# Patient Record
Sex: Female | Born: 1975 | Race: White | Hispanic: No | Smoking: Former smoker
Health system: Southern US, Community
[De-identification: ages and names within clinical notes are randomized; demographics above are authoritative.]

## PROBLEM LIST (undated history)

## (undated) DIAGNOSIS — B009 Herpesviral infection, unspecified: Secondary | ICD-10-CM

## (undated) DIAGNOSIS — F419 Anxiety disorder, unspecified: Secondary | ICD-10-CM

## (undated) DIAGNOSIS — IMO0001 Reserved for inherently not codable concepts without codable children: Secondary | ICD-10-CM

## (undated) DIAGNOSIS — IMO0002 Reserved for concepts with insufficient information to code with codable children: Secondary | ICD-10-CM

## (undated) DIAGNOSIS — R112 Nausea with vomiting, unspecified: Secondary | ICD-10-CM

## (undated) DIAGNOSIS — F32A Depression, unspecified: Secondary | ICD-10-CM

## (undated) DIAGNOSIS — K219 Gastro-esophageal reflux disease without esophagitis: Secondary | ICD-10-CM

## (undated) DIAGNOSIS — C539 Malignant neoplasm of cervix uteri, unspecified: Secondary | ICD-10-CM

## (undated) DIAGNOSIS — Z9889 Other specified postprocedural states: Secondary | ICD-10-CM

## (undated) DIAGNOSIS — F329 Major depressive disorder, single episode, unspecified: Secondary | ICD-10-CM

## (undated) HISTORY — DX: Herpesviral infection, unspecified: B00.9

## (undated) HISTORY — PX: WISDOM TOOTH EXTRACTION: SHX21

## (undated) HISTORY — PX: BACK SURGERY: SHX140

## (undated) HISTORY — PX: COLPOSCOPY: SHX161

## (undated) HISTORY — DX: Major depressive disorder, single episode, unspecified: F32.9

## (undated) HISTORY — DX: Reserved for concepts with insufficient information to code with codable children: IMO0002

## (undated) HISTORY — DX: Reserved for inherently not codable concepts without codable children: IMO0001

## (undated) HISTORY — DX: Anxiety disorder, unspecified: F41.9

## (undated) HISTORY — DX: Depression, unspecified: F32.A

---

## 1998-05-19 ENCOUNTER — Other Ambulatory Visit: Admission: RE | Admit: 1998-05-19 | Discharge: 1998-05-19 | Payer: Self-pay | Admitting: *Deleted

## 1998-06-12 ENCOUNTER — Other Ambulatory Visit: Admission: RE | Admit: 1998-06-12 | Discharge: 1998-06-12 | Payer: Self-pay | Admitting: *Deleted

## 1998-09-18 ENCOUNTER — Other Ambulatory Visit: Admission: RE | Admit: 1998-09-18 | Discharge: 1998-09-18 | Payer: Self-pay | Admitting: *Deleted

## 1999-07-11 ENCOUNTER — Ambulatory Visit (HOSPITAL_COMMUNITY): Admission: RE | Admit: 1999-07-11 | Discharge: 1999-07-11 | Payer: Self-pay | Admitting: Gastroenterology

## 1999-07-11 HISTORY — PX: COLONOSCOPY: SHX5424

## 1999-07-13 ENCOUNTER — Other Ambulatory Visit: Admission: RE | Admit: 1999-07-13 | Discharge: 1999-07-13 | Payer: Self-pay | Admitting: *Deleted

## 2000-01-14 ENCOUNTER — Other Ambulatory Visit: Admission: RE | Admit: 2000-01-14 | Discharge: 2000-01-14 | Payer: Self-pay | Admitting: *Deleted

## 2000-06-13 ENCOUNTER — Other Ambulatory Visit: Admission: RE | Admit: 2000-06-13 | Discharge: 2000-06-13 | Payer: Self-pay | Admitting: *Deleted

## 2000-07-28 ENCOUNTER — Ambulatory Visit (HOSPITAL_COMMUNITY): Admission: RE | Admit: 2000-07-28 | Discharge: 2000-07-28 | Payer: Self-pay | Admitting: Obstetrics and Gynecology

## 2000-07-28 HISTORY — PX: DIAGNOSTIC LAPAROSCOPY: SUR761

## 2001-02-16 ENCOUNTER — Emergency Department (HOSPITAL_COMMUNITY): Admission: EM | Admit: 2001-02-16 | Discharge: 2001-02-16 | Payer: Self-pay | Admitting: Emergency Medicine

## 2001-03-26 ENCOUNTER — Encounter: Payer: Self-pay | Admitting: Obstetrics and Gynecology

## 2001-03-26 ENCOUNTER — Ambulatory Visit (HOSPITAL_COMMUNITY): Admission: RE | Admit: 2001-03-26 | Discharge: 2001-03-26 | Payer: Self-pay | Admitting: Obstetrics and Gynecology

## 2001-04-10 ENCOUNTER — Encounter: Payer: Self-pay | Admitting: Obstetrics and Gynecology

## 2001-04-10 ENCOUNTER — Ambulatory Visit (HOSPITAL_COMMUNITY): Admission: RE | Admit: 2001-04-10 | Discharge: 2001-04-10 | Payer: Self-pay | Admitting: Obstetrics and Gynecology

## 2001-08-08 ENCOUNTER — Inpatient Hospital Stay (HOSPITAL_COMMUNITY): Admission: AD | Admit: 2001-08-08 | Discharge: 2001-08-10 | Payer: Self-pay | Admitting: *Deleted

## 2001-09-10 ENCOUNTER — Other Ambulatory Visit: Admission: RE | Admit: 2001-09-10 | Discharge: 2001-09-10 | Payer: Self-pay | Admitting: Obstetrics and Gynecology

## 2003-02-10 ENCOUNTER — Other Ambulatory Visit: Admission: RE | Admit: 2003-02-10 | Discharge: 2003-02-10 | Payer: Self-pay | Admitting: Obstetrics and Gynecology

## 2004-10-31 ENCOUNTER — Other Ambulatory Visit: Admission: RE | Admit: 2004-10-31 | Discharge: 2004-10-31 | Payer: Self-pay | Admitting: Obstetrics and Gynecology

## 2005-06-23 ENCOUNTER — Inpatient Hospital Stay (HOSPITAL_COMMUNITY): Admission: AD | Admit: 2005-06-23 | Discharge: 2005-06-25 | Payer: Self-pay | Admitting: Obstetrics and Gynecology

## 2009-01-27 ENCOUNTER — Emergency Department (HOSPITAL_COMMUNITY): Admission: EM | Admit: 2009-01-27 | Discharge: 2009-01-28 | Payer: Self-pay | Admitting: Emergency Medicine

## 2009-01-28 ENCOUNTER — Inpatient Hospital Stay (HOSPITAL_COMMUNITY): Admission: AD | Admit: 2009-01-28 | Discharge: 2009-02-02 | Payer: Self-pay | Admitting: Psychiatry

## 2009-01-28 ENCOUNTER — Ambulatory Visit: Payer: Self-pay | Admitting: Psychiatry

## 2010-04-19 ENCOUNTER — Ambulatory Visit
Admission: RE | Admit: 2010-04-19 | Discharge: 2010-04-19 | Payer: Self-pay | Source: Home / Self Care | Attending: Obstetrics and Gynecology | Admitting: Obstetrics and Gynecology

## 2010-04-19 ENCOUNTER — Encounter: Payer: Self-pay | Admitting: Physician Assistant

## 2010-04-19 LAB — CONVERTED CEMR LAB
HCV Ab: NEGATIVE
HIV: NONREACTIVE
Hepatitis B Surface Ag: NEGATIVE

## 2010-04-20 ENCOUNTER — Encounter: Payer: Self-pay | Admitting: Physician Assistant

## 2010-06-25 ENCOUNTER — Ambulatory Visit: Payer: Self-pay | Admitting: Obstetrics and Gynecology

## 2010-07-12 ENCOUNTER — Other Ambulatory Visit: Payer: Self-pay | Admitting: Physician Assistant

## 2010-07-12 ENCOUNTER — Ambulatory Visit: Payer: Self-pay | Admitting: Physician Assistant

## 2010-07-12 DIAGNOSIS — Z30430 Encounter for insertion of intrauterine contraceptive device: Secondary | ICD-10-CM

## 2010-07-12 LAB — POCT PREGNANCY, URINE: Preg Test, Ur: NEGATIVE

## 2010-07-13 ENCOUNTER — Other Ambulatory Visit: Payer: Self-pay | Admitting: Physician Assistant

## 2010-07-13 LAB — POCT PREGNANCY, URINE: Preg Test, Ur: NEGATIVE

## 2010-07-13 NOTE — Progress Notes (Signed)
NAMEGISELLA, Karen Daniels               ACCOUNT NO.:  [id number]  MEDICAL RECORD NO.:  [id number]           PATIENT TYPE:  A  LOCATION:  [facility]                   FACILITY:  [facility]  PHYSICIAN:  Maylon Cos, CNM    DATE OF BIRTH:  1975-05-01  DATE OF SERVICE:  07/12/2010                                 CLINIC NOTE  REASON FOR TODAY'S VISIT:  Mirena IUD insertion.  HISTORY OF PRESENT ILLNESS:  The patient is a 35 year old, no longer desiring fertility.  I saw her last on in January 2012.  She is gravida 4, para 2-0-1-2, desiring no more children.  She presents today for the insertion of her Mirena IUD, consent has been signed, and literature has been reviewed.  The patient was placed in lithotomy position and Graves speculum was used to visualize the para cervix.  Cervix was cleansed in normal fashion with Betadine solution and tenaculum was placed at approximately 2 o'clock on the cervix.  Uterus sounded to 8 cm, Mirena IUD, tolerated to the same depth, inserted without difficulty, IUD strings were trimmed to approximately 1 cm below the external os. Tenaculum was removed and hemostasis was maintained with pressure of Fox swab.  Speculum was removed.  The patient tolerated this procedure very well.  Lot number on ID used is [id number], expiration 11/14.  ASSESSMENT:  Mirena intrauterine device insertion.  PLAN: 1. The patient is to return in 5-6 weeks for further recheck of IUD     strings or p.r.n. problems. 2. The patient is given 800 mg ibuprofen prior leaving clinic today     for cramping as instructed that she may use 600-800 mg ibuprofen as     needed for abdominal cramping.          ______________________________ Maylon Cos, CNM    SS/MEDQ  D:  07/12/2010  T:  07/13/2010  Job:  [id number]

## 2010-07-19 LAB — CK TOTAL AND CKMB (NOT AT ARMC)
Relative Index: 0.8 (ref 0.0–2.5)
Total CK: 180 U/L — ABNORMAL HIGH (ref 7–177)

## 2010-07-19 LAB — URINALYSIS, ROUTINE W REFLEX MICROSCOPIC
Bilirubin Urine: NEGATIVE
Nitrite: NEGATIVE
Specific Gravity, Urine: 1.01 (ref 1.005–1.030)
pH: 7 (ref 5.0–8.0)

## 2010-07-19 LAB — ACETAMINOPHEN LEVEL: Acetaminophen (Tylenol), Serum: <10 ug/mL — ABNORMAL LOW (ref 10–30)

## 2010-07-19 LAB — RAPID URINE DRUG SCREEN, HOSP PERFORMED
Amphetamines: NOT DETECTED
Barbiturates: NOT DETECTED
Benzodiazepines: POSITIVE — AB
Cocaine: NOT DETECTED
Opiates: NOT DETECTED
Tetrahydrocannabinol: NOT DETECTED

## 2010-07-19 LAB — BASIC METABOLIC PANEL
BUN: 7 mg/dL (ref 6–23)
Calcium: 8.9 mg/dL (ref 8.4–10.5)
Creatinine, Ser: 0.71 mg/dL (ref 0.4–1.2)
GFR calc non Af Amer: >60 mL/min (ref >60)

## 2010-07-19 LAB — SALICYLATE LEVEL: Salicylate Lvl: <4 mg/dL (ref 2.8–20.0)

## 2010-07-19 LAB — DIFFERENTIAL
Eosinophils Absolute: 0.3 10*3/uL (ref 0.0–0.7)
Lymphocytes Relative: 40 % (ref 12–46)
Lymphs Abs: 3.4 10*3/uL (ref 0.7–4.0)
Neutrophils Relative %: 52 % (ref 43–77)

## 2010-07-19 LAB — CBC
Platelets: 328 10*3/uL (ref 150–400)
WBC: 8.7 10*3/uL (ref 4.0–10.5)

## 2010-07-19 LAB — PREGNANCY, URINE: Preg Test, Ur: NEGATIVE

## 2010-07-19 LAB — TROPONIN I: Troponin I: 0.01 ng/mL (ref 0.00–0.06)

## 2010-07-19 LAB — ETHANOL: Alcohol, Ethyl (B): 39 mg/dL — ABNORMAL HIGH (ref 0–10)

## 2010-08-23 ENCOUNTER — Ambulatory Visit: Payer: Self-pay | Admitting: Physician Assistant

## 2010-08-31 NOTE — Procedures (Signed)
[place]  Patient:    Karen Daniels, Karen Daniels                   MRN: [id number] Proc. Date: 07/11/99 Attending:  Anselmo Rod, M.D. CC:         Meliton Rattan, M.D.                           Procedure Report  DATE OF BIRTH:  [birth year]  REFERRING PHYSICIAN:  Meliton Rattan, M.D.  PROCEDURE PERFORMED:  Colonoscopy.  ENDOSCOPIST:  Anselmo Rod, M.D.  INSTRUMENT USED:  Olympus video colonoscope.  INDICATION FOR PROCEDURE:  Abdominal pain and right lower quadrant pain and diarrhea and guaiac positive stools in a 35 year old white female rule out colitis, Crohns, etc.  PREPROCEDURE PREPARATION: Informed consent was procured from the patient.  The patient was fasted for eight hours prior to the procedure and prepped with a bottle of magnesium citrate and a gallon of NuLytely the night prior to the procedure.  PREPROCEDURE PHYSICAL:  The patient had stable vital signs.  NECK:  Supple.  CHEST: Clear to auscultation.  S1 and S2 regular.  ABDOMEN:  Soft with normoactive bowel sounds.  DESCRIPTION OF PROCEDURE:  The patient was placed in the left lateral decubitus  position and sedated with 100 mg of Demerol and 10 mg of Versed intravenously.  Once the patient was adequately sedated and maintained on low flow oxygen and continuous cardiac monitoring, the Olympus video colonoscope was advanced from he rectum to the cecum and terminal ileum without difficulty.  Colonic mucosa appeared healthy without evidence of erosions, ulcerations, masses or polyps. There were  small internal hemorrhoids.  The patient tolerated the procedure well without complications.  IMPRESSION:  Normal colonoscopy and terminal ileum except for small nonbleeding  internal hemorrhoids.  RECOMMENDATIONS:  The patients CT scan will be reviewed again and a gynecological evaluation suggested.  The patient may need a small bowel study to further complete a GI  evaluation.  This will be discussed with her on the next office visit. DD:  07/11/99 TD:  07/12/99 Job: [id number]

## 2010-08-31 NOTE — Op Note (Signed)
[facility]  Patient:    Karen Daniels, Karen Daniels                   MRN: [id number] Proc. Date: 07/28/00 Attending:  Aram Beecham P. Ashley Royalty, M.D. CC:         -   Operative Report  PREOPERATIVE DIAGNOSIS:  Pelvic pain.  POSTOPERATIVE DIAGNOSIS:  Pelvic pain.  Periappendiceal adhesions.  PROCEDURE:  Diagnostic laparoscopy, lysis of adhesions.  SURGEON:  Cynthia P. Ashley Royalty, M.D.  ANESTHESIA:  General endotracheal.  ESTIMATED BLOOD LOSS:  50 cc.  COMPLICATIONS:  None.  DESCRIPTION OF PROCEDURE:  The patient was taken to the operating room and after the induction of adequate general endotracheal anesthesia was placed in the dorsal lithotomy position then prepped and draped in the usual fashion. The cervix was grasped about the anterior lip with a single-tooth tenaculum. A Hulka uterine manipulator was placed.  The bladder was drained with a red rubber catheter.  Attention was next turned to the abdomen.  An infraumbilical incision was made and the Veress needle was inserted into the peritoneal space.  Proper placement was tested by noting a negative aspirate and free flow of saline through the Veress needle; again with negative aspirate.  On my noting a response of a drop of saline placed at the hub of the Veress needle to ______ pressure after the abdominal wall was elevated.  Pneumoperitoneum was created with 2.5 L of CO2 using the automatic insufflator.  A disposable 10-11 trocar was then inserted into the peritoneal space; there was proper placement noted with the laparoscope.  The pelvis was inspected.  The uterus was freely mobile.  There was a small, approximately 0.5 cm serosal myoma on the left fundus posteriorly.  The tubes and ovaries appeared completely normal.  There was no evidence of endometriosis.  The anterior and posterior cul-de-sacs were also completely normal.  The fimbria on both ends were luxurious.  The pelvis was normal.  Inspection  of the appendix revealed the appendix to have some periappendiceal adhesions between it and the pelvic sidewalls.  The appendix itself was not inflamed or irritated.  The adhesions were filmy; they were taken down with unipolar scissors.  Bleeding was controlled with the bipolar cautery without difficulty.  The upper abdomen was also inspected and felt to be normal.  There were no hepatic adhesions.  The intestines appeared normal and were freely mobile.  No other abnormalities could be seen.  The instruments were then removed from the abdomen. Pneumoperitoneum was allowed to escape.  The sleeves were removed and the incisions were closed subcuticularly with 3-0 Dexon.  Instruments were removed from the vagina.  The procedure was terminated.  The patient tolerated the procedure well, and went in satisfactory condition to Post-anesthesia Recovery.DD:  07/28/00 TD:  07/28/00 Job: [id number]

## 2010-08-31 NOTE — Discharge Summary (Signed)
Karen Daniels, HEATON               ACCOUNT NO.:  [id number]   MEDICAL RECORD NO.:  [id number]          PATIENT TYPE:  INP   LOCATION:  9146                          [facility]ACILITY[facility]   PHYSICIAN:  Gerrit Friends. Aldona Bar, M.D.   DATE OF BIRTH:  July 02, 1975   DATE OF ADMISSION:  06/23/2005  DATE OF DISCHARGE:  06/25/2005                                 DISCHARGE SUMMARY   DISCHARGE DIAGNOSES:  1.  Term pregnancy, delivered, 6 pounds 5 ounces female infant, Apgars 9 and      9.  2.  Blood type O+.  3.  Positive Group B strep, antenatal.   PROCEDURES:  1.  Normal spontaneous delivery.  2.  Repair of second-degree tear.  3.  Intrapartum antibiotics.   SUMMARY:  This 35 year old gravida 3, para 1 was admitted at term in labor.  She had a positive group B strep culture at 36 weeks' gestation.  At the  time of admission, she was 4-cm dilated, 80% effaced, vertex, -1 station.  Fetal heart was reassuring.  Membranes were intact.  She was begun on  aqueous penicillin G for Group B strep prophylaxis.  She had good  progression of her labor with a subsequent normal spontaneous delivery of  viable 6 pounds 5 ounces female infant with Apgar of 9 and 9, over a second-  degree tear which was repaired without difficulty.  Her postpartum course  was benign.  Her discharge hemoglobin 11.9, white count 17,400, platelet  count 251,000.  On the morning of June 25, 2005, her vital signs were  stable.  She was bottle-feeding without difficulty.  She was comfortable,  ambulating well, tolerating a regular diet well, and was desirous of  discharge.  Accordingly, she was given all appropriate instructions and  understood all instructions well.   DISCHARGE MEDICATIONS:  1.  Vitamins one a day until gone.  2.  Motrin 600 mg every 6 hours as needed for cramping or pain.   She was bottle-feeding and was instructed appropriately.  She will return to  the office for followup in approximately four weeks' time or as  needed.   CONDITION ON DISCHARGE:  Improved.      Gerrit Friends. Aldona Bar, M.D.  Electronically Signed     RMW/MEDQ  D:  06/25/2005  T:  06/25/2005  Job:  [id number]

## 2010-09-05 ENCOUNTER — Ambulatory Visit: Payer: Self-pay | Admitting: Obstetrics & Gynecology

## 2010-09-05 ENCOUNTER — Ambulatory Visit (INDEPENDENT_AMBULATORY_CARE_PROVIDER_SITE_OTHER): Payer: Self-pay | Admitting: Family Medicine

## 2010-09-05 DIAGNOSIS — Z30431 Encounter for routine checking of intrauterine contraceptive device: Secondary | ICD-10-CM

## 2010-09-06 NOTE — Group Therapy Note (Signed)
NAMEJAZZMON, PRINDLE               ACCOUNT NO.:  [id number]  MEDICAL RECORD NO.:  [id number]           PATIENT TYPE:  A  LOCATION:  [facility]                   FACILITY:  [facility]  PHYSICIAN:  Sid Falcon, CNM  DATE OF BIRTH:  1975/08/27  DATE OF SERVICE:  09/05/2010                                 CLINIC NOTE  REASON FOR VISIT:  IUD string check.  The patient previously seen on March 29, had a Mirena IUD inserted without difficulty.  She is here today stating no problems with the apparatus and desires to continue.  No problems or concerns.  PHYSICAL EXAMINATION:  PELVIS:  Cervix visualized without difficulty. IUD string is present, approximately 2 cm.  ASSESSMENT:  Intrauterine device surveillance  PLAN:  Continue with IUD, may check once a month.  If desires, to check for expulsion.  Follow up in January for well-woman exam     Sid Falcon, [place]    WM/MEDQ  D:  09/05/2010  T:  09/06/2010  Job:  [id number]

## 2010-10-01 ENCOUNTER — Emergency Department (HOSPITAL_BASED_OUTPATIENT_CLINIC_OR_DEPARTMENT_OTHER)
Admission: EM | Admit: 2010-10-01 | Discharge: 2010-10-01 | Disposition: A | Discharge status: Home / Self Care | Payer: Self-pay | Attending: Emergency Medicine | Admitting: Emergency Medicine

## 2010-10-01 DIAGNOSIS — A499 Bacterial infection, unspecified: Secondary | ICD-10-CM | POA: Insufficient documentation

## 2010-10-01 DIAGNOSIS — A6000 Herpesviral infection of urogenital system, unspecified: Secondary | ICD-10-CM | POA: Insufficient documentation

## 2010-10-01 DIAGNOSIS — N390 Urinary tract infection, site not specified: Secondary | ICD-10-CM | POA: Insufficient documentation

## 2010-10-01 DIAGNOSIS — B9689 Other specified bacterial agents as the cause of diseases classified elsewhere: Secondary | ICD-10-CM | POA: Insufficient documentation

## 2010-10-01 DIAGNOSIS — N760 Acute vaginitis: Secondary | ICD-10-CM | POA: Insufficient documentation

## 2010-10-01 LAB — URINALYSIS, ROUTINE W REFLEX MICROSCOPIC
Bilirubin Urine: NEGATIVE
Specific Gravity, Urine: 1.006 (ref 1.005–1.030)
Urobilinogen, UA: 0.2 mg/dL (ref 0.0–1.0)

## 2010-10-01 LAB — WET PREP, GENITAL
Trich, Wet Prep: NONE SEEN
Yeast Wet Prep HPF POC: NONE SEEN

## 2010-10-01 LAB — URINE MICROSCOPIC-ADD ON

## 2010-10-02 LAB — GC/CHLAMYDIA PROBE AMP, GENITAL: GC Probe Amp, Genital: NEGATIVE

## 2010-10-08 LAB — HERPES SIMPLEX VIRUS CULTURE

## 2011-08-07 ENCOUNTER — Encounter: Payer: Self-pay | Admitting: Advanced Practice Midwife

## 2011-08-07 ENCOUNTER — Ambulatory Visit (INDEPENDENT_AMBULATORY_CARE_PROVIDER_SITE_OTHER): Payer: Self-pay | Admitting: Advanced Practice Midwife

## 2011-08-07 VITALS — BP 137/74 | HR 90 | Temp 97.2°F | Resp 16 | Ht 64.0 in | Wt 151.5 lb

## 2011-08-07 DIAGNOSIS — F17200 Nicotine dependence, unspecified, uncomplicated: Secondary | ICD-10-CM

## 2011-08-07 DIAGNOSIS — N898 Other specified noninflammatory disorders of vagina: Secondary | ICD-10-CM

## 2011-08-07 DIAGNOSIS — Z30432 Encounter for removal of intrauterine contraceptive device: Secondary | ICD-10-CM

## 2011-08-07 DIAGNOSIS — Z3041 Encounter for surveillance of contraceptive pills: Secondary | ICD-10-CM | POA: Insufficient documentation

## 2011-08-07 DIAGNOSIS — Z87891 Personal history of nicotine dependence: Secondary | ICD-10-CM | POA: Insufficient documentation

## 2011-08-07 MED ORDER — NORETHINDRONE 0.35 MG PO TABS: 1.0000 | ORAL_TABLET | Freq: Every day | ORAL | Status: DC

## 2011-08-07 NOTE — Progress Notes (Signed)
  Subjective:    Patient ID: Karen Daniels, female    DOB: 10/08/1975, 36 y.o.   MRN: [id number]  HPI Patient would like Mirena removed. Placed here 06/2010. She has been having periods twice a month and cramping with Mirena and would like it removed. She has also gained 30 pounds.    She recently became sexually active but was having symptoms prior.   Review of Systems No vaginal irritation but watery discharge for the past several months (preceding sexual activity).  She smokes 1/2 ppd.     Objective:   Physical Exam  PROCEDURE NOTE: IUD removal Patient given informed consent for IUD removal. She is aware this will stop the birth control method provided by the IUD immediately. Informed consent given and signed copy in the chart. IUD removed with ring forceps without complications.     Assessment & Plan:  Wet prep also performed due to significant clear vaginal discharge with fishy odor. Patient concerned about amount of discharge.  Discussed birth control options. Patient would like OCPs, however, she is at higher risk for VTE due to her age and smoking. Depo expensive for her. Will try Micronor for now. If patient able to prove abstinence from smoking, consider starting OCPs in a few months.  Patient will follow-up prn or in 1 year for her next pap smear (last in 2012, normal).   Agree with note. Wynelle Bourgeois CNM

## 2011-08-08 LAB — WET PREP, GENITAL
Clue Cells Wet Prep HPF POC: NONE SEEN
Trich, Wet Prep: NONE SEEN
Yeast Wet Prep HPF POC: NONE SEEN

## 2013-08-29 ENCOUNTER — Emergency Department (HOSPITAL_BASED_OUTPATIENT_CLINIC_OR_DEPARTMENT_OTHER): Payer: Medicaid Other

## 2013-08-29 ENCOUNTER — Encounter (HOSPITAL_BASED_OUTPATIENT_CLINIC_OR_DEPARTMENT_OTHER): Payer: Self-pay | Admitting: Emergency Medicine

## 2013-08-29 ENCOUNTER — Emergency Department (HOSPITAL_BASED_OUTPATIENT_CLINIC_OR_DEPARTMENT_OTHER)
Admission: EM | Admit: 2013-08-29 | Discharge: 2013-08-30 | Disposition: A | Discharge status: Home / Self Care | Payer: Medicaid Other | Attending: Emergency Medicine | Admitting: Emergency Medicine

## 2013-08-29 DIAGNOSIS — F3289 Other specified depressive episodes: Secondary | ICD-10-CM | POA: Insufficient documentation

## 2013-08-29 DIAGNOSIS — R5383 Other fatigue: Secondary | ICD-10-CM

## 2013-08-29 DIAGNOSIS — R079 Chest pain, unspecified: Secondary | ICD-10-CM | POA: Insufficient documentation

## 2013-08-29 DIAGNOSIS — N9489 Other specified conditions associated with female genital organs and menstrual cycle: Secondary | ICD-10-CM | POA: Insufficient documentation

## 2013-08-29 DIAGNOSIS — Z79899 Other long term (current) drug therapy: Secondary | ICD-10-CM | POA: Insufficient documentation

## 2013-08-29 DIAGNOSIS — F411 Generalized anxiety disorder: Secondary | ICD-10-CM | POA: Insufficient documentation

## 2013-08-29 DIAGNOSIS — R52 Pain, unspecified: Secondary | ICD-10-CM | POA: Insufficient documentation

## 2013-08-29 DIAGNOSIS — Z3202 Encounter for pregnancy test, result negative: Secondary | ICD-10-CM | POA: Insufficient documentation

## 2013-08-29 DIAGNOSIS — F329 Major depressive disorder, single episode, unspecified: Secondary | ICD-10-CM | POA: Insufficient documentation

## 2013-08-29 DIAGNOSIS — R5381 Other malaise: Secondary | ICD-10-CM | POA: Insufficient documentation

## 2013-08-29 DIAGNOSIS — Z8619 Personal history of other infectious and parasitic diseases: Secondary | ICD-10-CM | POA: Insufficient documentation

## 2013-08-29 DIAGNOSIS — R0602 Shortness of breath: Secondary | ICD-10-CM | POA: Insufficient documentation

## 2013-08-29 DIAGNOSIS — F17200 Nicotine dependence, unspecified, uncomplicated: Secondary | ICD-10-CM | POA: Insufficient documentation

## 2013-08-29 DIAGNOSIS — N898 Other specified noninflammatory disorders of vagina: Secondary | ICD-10-CM

## 2013-08-29 LAB — WET PREP, GENITAL
Trich, Wet Prep: NONE SEEN
Yeast Wet Prep HPF POC: NONE SEEN

## 2013-08-29 LAB — URINALYSIS, ROUTINE W REFLEX MICROSCOPIC
Bilirubin Urine: NEGATIVE
Glucose, UA: NEGATIVE mg/dL
Hgb urine dipstick: NEGATIVE
Ketones, ur: NEGATIVE mg/dL
Nitrite: NEGATIVE
Protein, ur: NEGATIVE mg/dL
Specific Gravity, Urine: 1.006 (ref 1.005–1.030)
Urobilinogen, UA: 1 mg/dL (ref 0.0–1.0)
pH: 7 (ref 5.0–8.0)

## 2013-08-29 LAB — CBC WITH DIFFERENTIAL/PLATELET
Basophils Absolute: 0 K/uL (ref 0.0–0.1)
Basophils Relative: 0 % (ref 0–1)
Eosinophils Absolute: 0.1 K/uL (ref 0.0–0.7)
Eosinophils Relative: 0 % (ref 0–5)
HCT: 37.3 % (ref 36.0–46.0)
Hemoglobin: 13 g/dL (ref 12.0–15.0)
Lymphocytes Relative: 17 % (ref 12–46)
Lymphs Abs: 2.6 K/uL (ref 0.7–4.0)
MCH: 31.9 pg (ref 26.0–34.0)
MCHC: 34.9 g/dL (ref 30.0–36.0)
MCV: 91.6 fL (ref 78.0–100.0)
Monocytes Absolute: 1.2 K/uL — ABNORMAL HIGH (ref 0.1–1.0)
Monocytes Relative: 8 % (ref 3–12)
Neutro Abs: 11.2 K/uL — ABNORMAL HIGH (ref 1.7–7.7)
Neutrophils Relative %: 74 % (ref 43–77)
Platelets: 302 K/uL (ref 150–400)
RBC: 4.07 MIL/uL (ref 3.87–5.11)
RDW: 12.2 % (ref 11.5–15.5)
WBC: 15.1 K/uL — ABNORMAL HIGH (ref 4.0–10.5)

## 2013-08-29 LAB — URINE MICROSCOPIC-ADD ON

## 2013-08-29 LAB — PREGNANCY, URINE: Preg Test, Ur: NEGATIVE

## 2013-08-29 MED ORDER — SODIUM CHLORIDE 0.9 % IV BOLUS (SEPSIS)
1000.0000 mL | Freq: Once | INTRAVENOUS | Status: AC
  Administered 2013-08-29: 1000 mL via INTRAVENOUS

## 2013-08-29 NOTE — ED Notes (Signed)
Minimal vaginal bleeding externally noted.

## 2013-08-29 NOTE — ED Notes (Signed)
Family/visitors with patient.  Providing emotional support.  No acute needs were immediately noted/voiced.

## 2013-08-29 NOTE — ED Notes (Signed)
Pt states she thought she had a bug and was concerned after she developed CP she decided to come in. Pt states that she has been having vaginal discharge pour out of her. Pt states generalized body aches and SOB, fatigue and chills since Friday. Pt states right sided chest pain. Pt known increases for pain including movement or deep breath.

## 2013-08-29 NOTE — ED Provider Notes (Signed)
CSN: [id number]     Arrival date & time 08/29/13  1907 History   First MD Initiated Contact with Patient 08/29/13 2055     Chief Complaint  Patient presents with  . Generalized Body Aches  . Chest Pain  . Vaginal Discharge     (Consider location/radiation/quality/duration/timing/severity/associated sxs/prior Treatment) Patient is a 38 y.o. female presenting with vaginal discharge. The history is provided by the patient. No language interpreter was used.  Vaginal Discharge Associated symptoms: no abdominal pain, no dysuria, no fever, no nausea and no vomiting   Associated symptoms comment:  She reports copious vaginal discharge x 2 days. She denies dysuria, or abnormal vaginal bleeding. No significant abdominal pain. She has experienced generalized body aches, "muscle aches" especially in the lower extremities. She feels chilled and has had a low grade fever. She reports SOB but denies cough or chest pain. The SOB is exertional. She has been with a new sexual partner once and subsequently developed symptoms.   Past Medical History  Diagnosis Date  . Abnormal Pap smear   . 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 Anxiety   . Depression    Past Surgical History  Procedure Laterality Date  . Diagnostic laparoscopy      r/o endometriosis  . Wisdom tooth extraction    . Colposcopy     Family History  Problem Relation Age of Onset  . Heart disease Father   . COPD Father   . Diabetes Father   . Hypertension Father   . Arthritis Father   . Arthritis Mother    History  Substance Use Topics  . Smoking status: Current Every Day Smoker -- 0.50 packs/day for 2 years    Types: Cigarettes  . Smokeless tobacco: Not on file  . Alcohol Use: Yes     Comment: occasionally   OB History   Grav Para Term Preterm Abortions TAB SAB Ect Mult Living   4 2 2  2 1 1   2      Review of Systems  Constitutional: Positive for fatigue. Negative for fever.  Respiratory: Positive for  shortness of breath. Negative for cough.   Cardiovascular: Positive for chest pain.  Gastrointestinal: Negative for nausea, vomiting and abdominal pain.  Genitourinary: Positive for vaginal discharge. Negative for dysuria and vaginal bleeding.  Neurological: Positive for weakness.      Allergies  Review of patient's allergies indicates no known allergies.  Home Medications   Prior to Admission medications   Medication Sig Start Date End Date Taking? Authorizing Provider  gabapentin (NEURONTIN) 100 MG capsule Take 100 mg by mouth 4 (four) times daily as needed.    Historical Provider, MD  norethindrone (MICRONOR,CAMILA,ERRIN) 0.35 MG tablet Take 1 tablet (0.35 mg total) by mouth daily. 08/07/11 08/06/12  Carolin Guernsey, MD   BP 147/83  Pulse 112  Temp(Src) 99.4 F (37.4 C) (Oral)  Resp 20  Ht 5\' 5"  (1.651 m)  Wt 143 lb (64.864 kg)  BMI 23.80 kg/m2  SpO2 100%  LMP 08/15/2013 Physical Exam  Constitutional: She is oriented to person, place, and time. She appears well-developed and well-nourished. No distress.  HENT:  Head: Normocephalic.  Eyes: Conjunctivae are normal.  Neck: Normal range of motion.  Cardiovascular: Normal rate.   No murmur heard. Pulmonary/Chest: Effort normal and breath sounds normal. She has no wheezes. She has no rales. She exhibits no tenderness.  Abdominal: Soft. There is no tenderness.  Genitourinary:  Copious green vaginal discharge.  There is a large mass in the vaginal vault that is friable and bleeds easily. The cervix is not visualized. The origin of the green discharge does not appear to be the mass itself but is unclear. There is no tenderness throughout the pelvis.   Neurological: She is alert and oriented to person, place, and time.  Skin: Skin is warm and dry.  Psychiatric: She has a normal mood and affect.    ED Course  Procedures (including critical care time) Labs Review Labs Reviewed  WET PREP, GENITAL  GC/CHLAMYDIA PROBE AMP  CBC  WITH DIFFERENTIAL  URINALYSIS, ROUTINE W REFLEX MICROSCOPIC  PREGNANCY, URINE   Results for orders placed during the hospital encounter of 08/29/13  WET PREP, GENITAL      Result Value Ref Range   Yeast Wet Prep HPF POC NONE SEEN  NONE SEEN   Trich, Wet Prep NONE SEEN  NONE SEEN   Clue Cells Wet Prep HPF POC FEW (*) NONE SEEN   WBC, Wet Prep HPF POC FEW (*) NONE SEEN  CBC WITH DIFFERENTIAL      Result Value Ref Range   WBC 15.1 (*) 4.0 - 10.5 K/uL   RBC 4.07  3.87 - 5.11 MIL/uL   Hemoglobin 13.0  12.0 - 15.0 g/dL   HCT 37.3  36.0 - 46.0 %   MCV 91.6  78.0 - 100.0 fL   MCH 31.9  26.0 - 34.0 pg   MCHC 34.9  30.0 - 36.0 g/dL   RDW 12.2  11.5 - 15.5 %   Platelets 302  150 - 400 K/uL   Neutrophils Relative % 74  43 - 77 %   Neutro Abs 11.2 (*) 1.7 - 7.7 K/uL   Lymphocytes Relative 17  12 - 46 %   Lymphs Abs 2.6  0.7 - 4.0 K/uL   Monocytes Relative 8  3 - 12 %   Monocytes Absolute 1.2 (*) 0.1 - 1.0 K/uL   Eosinophils Relative 0  0 - 5 %   Eosinophils Absolute 0.1  0.0 - 0.7 K/uL   Basophils Relative 0  0 - 1 %   Basophils Absolute 0.0  0.0 - 0.1 K/uL  URINALYSIS, ROUTINE W REFLEX MICROSCOPIC      Result Value Ref Range   Color, Urine YELLOW  YELLOW   APPearance CLEAR  CLEAR   Specific Gravity, Urine 1.006  1.005 - 1.030   pH 7.0  5.0 - 8.0   Glucose, UA NEGATIVE  NEGATIVE mg/dL   Hgb urine dipstick NEGATIVE  NEGATIVE   Bilirubin Urine NEGATIVE  NEGATIVE   Ketones, ur NEGATIVE  NEGATIVE mg/dL   Protein, ur NEGATIVE  NEGATIVE mg/dL   Urobilinogen, UA 1.0  0.0 - 1.0 mg/dL   Nitrite NEGATIVE  NEGATIVE   Leukocytes, UA TRACE (*) NEGATIVE  PREGNANCY, URINE      Result Value Ref Range   Preg Test, Ur NEGATIVE  NEGATIVE  URINE MICROSCOPIC-ADD ON      Result Value Ref Range   Squamous Epithelial / LPF RARE  RARE   WBC, UA 3-6  <3 WBC/hpf   RBC / HPF 0-2  <3 RBC/hpf    Imaging Review No results found.   EKG Interpretation None      MDM   Final diagnoses:  None     Patient care transferred to Dr. Christy Gentles with imaging pending in evaluation of pelvic mass.     Dewaine Oats, PA-C 08/29/13 2317

## 2013-08-30 ENCOUNTER — Ambulatory Visit (INDEPENDENT_AMBULATORY_CARE_PROVIDER_SITE_OTHER): Payer: Medicaid Other | Admitting: Obstetrics & Gynecology

## 2013-08-30 ENCOUNTER — Telehealth (HOSPITAL_COMMUNITY): Payer: Self-pay | Admitting: *Deleted

## 2013-08-30 ENCOUNTER — Encounter: Payer: Self-pay | Admitting: Obstetrics & Gynecology

## 2013-08-30 VITALS — BP 124/84 | HR 96 | Ht 65.0 in | Wt 143.8 lb

## 2013-08-30 DIAGNOSIS — N888 Other specified noninflammatory disorders of cervix uteri: Secondary | ICD-10-CM

## 2013-08-30 DIAGNOSIS — N898 Other specified noninflammatory disorders of vagina: Secondary | ICD-10-CM

## 2013-08-30 MED ORDER — OXYCODONE-ACETAMINOPHEN 5-325 MG PO TABS: 2.0000 | ORAL_TABLET | ORAL | Status: DC | PRN

## 2013-08-30 NOTE — Progress Notes (Signed)
Subjective:     Patient ID: Karen Daniels, female   DOB: 24-Oct-1975, 38 y.o.   MRN: [id number]  HPI Pt presents for eval of cervical mass noted on exam in the ER.  Pt was seen in the ED with complaints of  abd/Pelvic pain and malaise.  She also complained of a clear discharge that has occurred for 'several years' but, has become much heavier over the past several days/weeks.  She was worried about an STI.  No PAP for 8 years. Reports that she had an exam 2 years ago to have in IUD placed and then removed.    Review of Systems     Objective:   Physical ExamPt in NAD.  Very pleasant lady accompanied by her mother and best friend    GU: EGBUS: no lesions Vagina: no blood in vault; large mass effect in vagina Cervix: no normal cervical tissue noted.  Mass approximately 4-5 cm in diameter.  Appeared friable.  Copious amounts of clear discharge noted.  PAP obtained- lesion quite friable   FINDINGS:  Uterus  Measurements: 12.4 x 5.0 x 7.9 cm. The cervix appears masslike and  is markedly enlarged measuring up to 7.3 x 4.8 x 7.4 cm, highly  suspicious for advanced cervical neoplasm.  Endometrium  Thickness: 9.4 mm. No focal abnormality visualized.  Right ovary  Measurements: 3.8 x 2.4 x 3.0 cm. Normal appearance/no adnexal mass.  Left ovary  Measurements: 4.2 x 1.8 x 2.4 cm. Normal appearance/no adnexal mass.  Other findings  Trace volume of free fluid in the cul-de-sac  IMPRESSION:  1. Findings, as above, highly concerning for cervical carcinoma.  Correlation with physical examination and referral to Gynecology is  strongly recommended.   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  Reviewed with pt process of diagnosis.  Pt offered biopsies before or after grant application.  Pt opts to apply for grant first.  bx postponed       Plan:    f/u PAP Refer to [facility] F/u for biopsies ASAP after she applies for the grant

## 2013-08-30 NOTE — Patient Instructions (Signed)
Cervical Cancer The cervix is the opening and bottom part of the uterus between the vagina and the uterus. Cervical cancer is a fairly common cancer. It occurs most often in women between the ages of 37 years and 55 years. Cells of the cervix act very much like skin cells. These cells are exposed to toxins, viruses, and bacteria that may cause abnormal changes.  There are two kinds of cancers of the cervix:   Squamous cell carcinoma This type of cancer starts in the flat or scale-like cells that line the cervix. Squamous cell carcinoma can develop from a sexually transmitted infection caused by the human papillomavirus (HPV).   Adenocarcinoma This type of cervical cancer starts in glandular cells that line the cervix. RISK FACTORS The risk of getting cancer of the cervix is related to your lifestyle, sexual history, health, and immune system. Risks for cervical cancer include:   Having a sexually transmitted viral infection. These include:  Chlamydia.   Herpes.   HPV.  Becoming sexually active before age 18 years.   Having more than one sexual partner or having sex with someone who has more than one sexual partner.   Not using condoms with sexual partners.   Having had cancer of the vagina or vulva.   Having a sexual partner who has or had cancer of the penis or who has had a sexual partner with cervical dysplasia or cervical cancer.   Using oral contraceptives (also called birth control pills).  Smoking.   Having a weakened immune system. For example, human immunodeficiency virus (HIV) or other immune deficiency disorders.   Being the daughter of a woman who took diethylstilbestrol (DES) during pregnancy.   Having a sister or mother who has had cancer of the cervix.   Being African American, Hispanic, Asian, or a woman from the [place].   A history of dysplasia of the cervix. SIGNS AND SYMPTOMS  Symptoms are usually not present in the early stages of  cervical cancer. Once the cancer invades the cervix and surrounding tissues, the woman may have:   Abnormal vaginal bleeding or menstrual bleeding that is longer or heavier than usual.   Bleeding after intercourse, douching, or a Pap test.   Vaginal bleeding following menopause.   Abnormal vaginal discharge.  Pelvic discomfort or pain.  An abnormal Pap test.  Pain during sexual intercourse. Symptoms of more advanced cervical cancer may include:   Loss of appetite or weight loss.   Tiredness (fatigue).   Back and leg pain.   Inability to control urination or bowel movements. DIAGNOSIS  A pelvic exam and Pap test are done to diagnose the condition. If abnormalities are found during the exam or Pap test, the Pap test may be repeated in 3 months, or your health care provider may do additional tests or procedures, such as:   A colposcopy This is a procedure that uses a special microscope that allows the health care provider to magnify and closely examine the cells of the cervix, vagina, and vulva.   Cervical biopsies This is a procedure where small tissue samples are taken from the cervix to be examined under a microscope by a specialist.   A cone biopsy This is a procedure to test for or remove cancerous tissue.  Other tests may be needed, including:   Cystoscopy.   Proctoscopy or sigmoidoscopy.   Ultrasound.   CT scan.   MRI.   Laparoscopy.  There are different stages of cervical cancer:   Stage   0, carcinoma in situ (CIS) This first stage of cancer is the last and most serious stage of dysplasia.   Stage 1 This means the tumor is in the uterus and cervix only.   Stage 2 This means the tumor has spread to the upper vagina. The cancer has spread beyond the uterus, but not to the pelvic walls or lower third of the vagina.   Stage 3 This means the tumor has invaded the side wall of the pelvis and the lower third of the vagina. Blockage of the tubes  that carry urine (ureters) from the tumor may cause urine to back up and cause the kidneys to swell (hydronephrosis).   Stage 4 This means the tumor has spread to the rectum or bladder. In the later part of this stage, it has also spread to distant organs, like the lungs.  TREATMENT  Treatment options can include:   Cone biopsy to remove the cancerous tissue.   Removal of the entire uterus and cervix.   Removal of the uterus, cervix, upper vagina, lymph nodes, and surrounding tissue (modified radical hysterectomy). The ovaries may be left in place or removed.   Medicines to treat cancer.   A combination of surgery, radiation, and chemotherapy.   Biological response modifiers. These are substances that help strengthen your immune system's fight against cancer or infection. They may be used in combination with chemotherapy.  HOME CARE INSTRUCTIONS   Get a gynecology exam and Pap test once every year or as directed by your health care provider.   Get the HPV vaccine.   Do not smoke.  Do not have sexual intercourse until your health care provider says it is okay.  Use a condom every time you have sex. SEEK MEDICAL CARE IF:   You have increased pelvic pain or pressure.   Your are becoming increasingly tired.   You have increased leg or back pain.   You have a fever.  You have abnormal bleeding or discharge.  You lose weight. SEEK IMMEDIATE MEDICAL CARE IF:   You cannot urinate.  You have blood in your urine.   You have blood or pressure with a bowel movement.   You develop severe back, stomach, or pelvic pain. Document Released: 04/01/2005 Document Revised: 12/02/2012 Document Reviewed: 09/23/2012 ExitCare Patient Information 2014 [employer].  

## 2013-08-30 NOTE — Telephone Encounter (Signed)
Patient referred to [facility] by Dr. Elly Modena and Dr. Ihor Dow. Called patient and scheduled her an appointment to come to [facility] clinic tomorrow, Aug 31, 2013 at 1145. Verified eligibility and explained the BCCCP program to her.  Gave directions on where she will need to go. Patient verbalized understanding.

## 2013-08-30 NOTE — Discharge Instructions (Signed)
PLEASE RETURN FOR WORSENED PAIN, BLEEDING, WEAKNESS OVER THE NEXT 24 HOURS PLEASE CALL TOMORROW [phone]) FOR FOLLOWUP WITHIN 2 WEEKS

## 2013-08-30 NOTE — ED Provider Notes (Signed)
PT INFORMED OF RESULTS SHE WAS INFORMED WITH NURSE PRESENT I SPOKE TO DR [facility], REQUESTS SHE CALLS [phone] TOMORROW FOR FOLLOWUP IN 2 WEEKS PAIN MEDS ORDERED FOR PATIENT SHE IS CURRENTLY AWAKE/ALERT/APPROPRIATE AND STABLE FOR DISCHARGE  Sharyon Cable, MD 08/30/13 [phone]

## 2013-08-31 ENCOUNTER — Encounter (HOSPITAL_COMMUNITY): Payer: Self-pay

## 2013-08-31 ENCOUNTER — Ambulatory Visit (HOSPITAL_COMMUNITY)
Admission: RE | Admit: 2013-08-31 | Discharge: 2013-08-31 | Disposition: A | Discharge status: Home / Self Care | Payer: Medicaid Other | Source: Ambulatory Visit | Attending: Obstetrics and Gynecology | Admitting: Obstetrics and Gynecology

## 2013-08-31 VITALS — BP 104/60 | Temp 98.6°F | Ht 65.0 in | Wt 143.4 lb

## 2013-08-31 DIAGNOSIS — Z1239 Encounter for other screening for malignant neoplasm of breast: Secondary | ICD-10-CM

## 2013-08-31 NOTE — Progress Notes (Signed)
Patient referred to BCCCP due to cervical mass.  Pap Smear:  Pap smear not completed today. Last Pap smear was 08/30/2013 at the [facility] and the result is not posted. Have called cytology to get result. Per patient had and abnormal Pap smear around 16-17 years ago that she thinks she had a colposcopy for follow-up. Prior Pap smear on 04/19/2010 is in EPIC.  Physical exam: Breasts Breasts symmetrical. No skin abnormalities bilateral breasts. No nipple retraction bilateral breasts. No nipple discharge bilateral breasts. No lymphadenopathy. No lumps palpated bilateral breasts. No complaints of pain or tenderness on exam. Screening mammogram recommended at age 38 unless clinically indicated prior.  Pelvic/Bimanual No Pap smear completed today since last Pap smear was 08/30/2013. Pap smear not indicated per BCCCP guidelines.

## 2013-08-31 NOTE — Addendum Note (Signed)
Encounter addended by: Shirley Muscat, RN on: 08/31/2013  3:01 PM<BR>     Documentation filed: Visit Diagnoses

## 2013-08-31 NOTE — Patient Instructions (Signed)
[place] how to perform BSE and gave educational materials to take home. Patient did not need a Pap smear today due to last Pap smear was 08/30/2013. Told patient that either the [facility] or we would call her back with results and follow-up appointment. Screening mammogram recommended at age 38 unless clinically indicated prior.  [name] verbalized understanding.  Shirley Muscat, RN 2:21 PM

## 2013-08-31 NOTE — ED Provider Notes (Signed)
Medical screening examination/treatment/procedure(s) were performed by non-physician practitioner and as supervising physician I was immediately available for consultation/collaboration.   EKG Interpretation   Date/Time:  Sunday Aug 29 2013 19:28:23 EDT Ventricular Rate:  110 PR Interval:  154 QRS Duration: 78 QT Interval:  320 QTC Calculation: 433 R Axis:   91 Text Interpretation:  Sinus tachycardia Rightward axis Compared to  previous tracing T wave inversion NO LONGER PRESENT V2 Confirmed by Stevie Kern   MD, Jenny Reichmann ([id number]) on 08/29/2013 9:43:35 PM       Babette Relic, MD 08/31/13 [phone]

## 2013-09-01 ENCOUNTER — Encounter: Payer: Self-pay | Admitting: Obstetrics & Gynecology

## 2013-09-01 ENCOUNTER — Other Ambulatory Visit (HOSPITAL_COMMUNITY)
Admission: RE | Admit: 2013-09-01 | Discharge: 2013-09-01 | Disposition: A | Discharge status: Home / Self Care | Payer: Medicaid Other | Source: Ambulatory Visit | Attending: Obstetrics & Gynecology | Admitting: Obstetrics & Gynecology

## 2013-09-01 ENCOUNTER — Ambulatory Visit (INDEPENDENT_AMBULATORY_CARE_PROVIDER_SITE_OTHER): Payer: Medicaid Other | Admitting: Obstetrics & Gynecology

## 2013-09-01 VITALS — BP 108/73 | HR 84 | Temp 97.3°F | Ht 65.0 in | Wt 143.5 lb

## 2013-09-01 DIAGNOSIS — C539 Malignant neoplasm of cervix uteri, unspecified: Secondary | ICD-10-CM

## 2013-09-01 NOTE — Progress Notes (Signed)
Subjective:     Patient ID: Karen Daniels, female   DOB: [birth year], 38 y.o.   MRN: [id number]  HPI Pt presents for biopsy of cervical mass   Review of Systems     Objective:   Physical Exam BP 108/73  Pulse 84  Temp(Src) 97.3 F (36.3 C) (Oral)  Ht 5\' 5"  (1.651 m)  Wt 143 lb 8 oz (65.091 kg)  BMI 23.88 kg/m2  LMP 08/15/2013 Pt in NAD.  Pleasant GU: Vagina: copious amounts of mucous from vagina; no blood in vault Cervix: 4cm mass noted from cervix.  I cannot appreciate an attachment to the vagina sidewall  Procedure: after appropriate time out was performed random biopsies were taken.  Bleeding was stopped with application of Monsel's solution.       08/30/2013 Adequacy Reason Satisfactory for evaluation, endocervical/transformation zone component PRESENT. Diagnosis ADENOCARCINOMA, FAVOR ENDOCERVICAL ORIGIN. Enid Cutter MD Pathologist, Electronic Signature (Case signed 08/31/2013) Specimen Clinical Information No pap X8 years; suspect cervical carcinoma Source CervicoVaginal Pap [ThinPrep Imaged] Ancillary Testing Chlamydia T. CT: Negative Completed by JB on 2013-08-31 HPV High Risk **DETECTED** Completed by JB on 2013-08-31 Neisseria G. NG: Negative Completed by JB on 2013-08-31 Assessment:     Cervical cancer- suspect adencarcinoma  tob use- Pt stopped smoking    Plan:     Referral made to Dr. Stephannie Peters on Tues May 26th @ 1:45pm F/u here prn  40 min total spent with patient in face to face discussion Encourage tobacco cessation   F/u surg path- pt would like to be called with the results.

## 2013-09-01 NOTE — Patient Instructions (Signed)
Cervical Cancer The cervix is the opening and bottom part of the uterus between the vagina and the uterus. Cervical cancer is a fairly common cancer. It occurs most often in women between the ages of 40 years and 55 years. Cells of the cervix act very much like skin cells. These cells are exposed to toxins, viruses, and bacteria that may cause abnormal changes.  There are two kinds of cancers of the cervix:   Squamous cell carcinoma This type of cancer starts in the flat or scale-like cells that line the cervix. Squamous cell carcinoma can develop from a sexually transmitted infection caused by the human papillomavirus (HPV).   Adenocarcinoma This type of cervical cancer starts in glandular cells that line the cervix. RISK FACTORS The risk of getting cancer of the cervix is related to your lifestyle, sexual history, health, and immune system. Risks for cervical cancer include:   Having a sexually transmitted viral infection. These include:  Chlamydia.   Herpes.   HPV.  Becoming sexually active before age 18 years.   Having more than one sexual partner or having sex with someone who has more than one sexual partner.   Not using condoms with sexual partners.   Having had cancer of the vagina or vulva.   Having a sexual partner who has or had cancer of the penis or who has had a sexual partner with cervical dysplasia or cervical cancer.   Using oral contraceptives (also called birth control pills).  Smoking.   Having a weakened immune system. For example, human immunodeficiency virus (HIV) or other immune deficiency disorders.   Being the daughter of a woman who took diethylstilbestrol (DES) during pregnancy.   Having a sister or mother who has had cancer of the cervix.   Being African American, Hispanic, Asian, or a woman from the [place].   A history of dysplasia of the cervix. SIGNS AND SYMPTOMS  Symptoms are usually not present in the early stages of  cervical cancer. Once the cancer invades the cervix and surrounding tissues, the woman may have:   Abnormal vaginal bleeding or menstrual bleeding that is longer or heavier than usual.   Bleeding after intercourse, douching, or a Pap test.   Vaginal bleeding following menopause.   Abnormal vaginal discharge.  Pelvic discomfort or pain.  An abnormal Pap test.  Pain during sexual intercourse. Symptoms of more advanced cervical cancer may include:   Loss of appetite or weight loss.   Tiredness (fatigue).   Back and leg pain.   Inability to control urination or bowel movements. DIAGNOSIS  A pelvic exam and Pap test are done to diagnose the condition. If abnormalities are found during the exam or Pap test, the Pap test may be repeated in 3 months, or your health care provider may do additional tests or procedures, such as:   A colposcopy This is a procedure that uses a special microscope that allows the health care provider to magnify and closely examine the cells of the cervix, vagina, and vulva.   Cervical biopsies This is a procedure where small tissue samples are taken from the cervix to be examined under a microscope by a specialist.   A cone biopsy This is a procedure to test for or remove cancerous tissue.  Other tests may be needed, including:   Cystoscopy.   Proctoscopy or sigmoidoscopy.   Ultrasound.   CT scan.   MRI.   Laparoscopy.  There are different stages of cervical cancer:   Stage   0, carcinoma in situ (CIS) This first stage of cancer is the last and most serious stage of dysplasia.   Stage 1 This means the tumor is in the uterus and cervix only.   Stage 2 This means the tumor has spread to the upper vagina. The cancer has spread beyond the uterus, but not to the pelvic walls or lower third of the vagina.   Stage 3 This means the tumor has invaded the side wall of the pelvis and the lower third of the vagina. Blockage of the tubes  that carry urine (ureters) from the tumor may cause urine to back up and cause the kidneys to swell (hydronephrosis).   Stage 4 This means the tumor has spread to the rectum or bladder. In the later part of this stage, it has also spread to distant organs, like the lungs.  TREATMENT  Treatment options can include:   Cone biopsy to remove the cancerous tissue.   Removal of the entire uterus and cervix.   Removal of the uterus, cervix, upper vagina, lymph nodes, and surrounding tissue (modified radical hysterectomy). The ovaries may be left in place or removed.   Medicines to treat cancer.   A combination of surgery, radiation, and chemotherapy.   Biological response modifiers. These are substances that help strengthen your immune system's fight against cancer or infection. They may be used in combination with chemotherapy.  HOME CARE INSTRUCTIONS   Get a gynecology exam and Pap test once every year or as directed by your health care provider.   Get the HPV vaccine.   Do not smoke.  Do not have sexual intercourse until your health care provider says it is okay.  Use a condom every time you have sex. SEEK MEDICAL CARE IF:   You have increased pelvic pain or pressure.   Your are becoming increasingly tired.   You have increased leg or back pain.   You have a fever.  You have abnormal bleeding or discharge.  You lose weight. SEEK IMMEDIATE MEDICAL CARE IF:   You cannot urinate.  You have blood in your urine.   You have blood or pressure with a bowel movement.   You develop severe back, stomach, or pelvic pain. Document Released: 04/01/2005 Document Revised: 12/02/2012 Document Reviewed: 09/23/2012 ExitCare Patient Information 2014 [employer].  

## 2013-09-07 ENCOUNTER — Ambulatory Visit: Payer: Medicaid Other | Attending: Gynecologic Oncology | Admitting: Gynecologic Oncology

## 2013-09-07 ENCOUNTER — Encounter: Payer: Self-pay | Admitting: Gynecologic Oncology

## 2013-09-07 VITALS — BP 108/71 | HR 70 | Temp 98.1°F | Ht 65.0 in | Wt 146.2 lb

## 2013-09-07 DIAGNOSIS — C530 Malignant neoplasm of endocervix: Secondary | ICD-10-CM | POA: Insufficient documentation

## 2013-09-07 DIAGNOSIS — C539 Malignant neoplasm of cervix uteri, unspecified: Secondary | ICD-10-CM | POA: Insufficient documentation

## 2013-09-07 DIAGNOSIS — C801 Malignant (primary) neoplasm, unspecified: Secondary | ICD-10-CM

## 2013-09-07 NOTE — Patient Instructions (Signed)
Cervical Cancer The cervix is the opening and bottom part of the uterus between the vagina and the uterus. Cervical cancer is a fairly common cancer. It occurs most often in women between the ages of 40 years and 55 years. Cells of the cervix act very much like skin cells. These cells are exposed to toxins, viruses, and bacteria that may cause abnormal changes.  There are two kinds of cancers of the cervix:   Squamous cell carcinoma This type of cancer starts in the flat or scale-like cells that line the cervix. Squamous cell carcinoma can develop from a sexually transmitted infection caused by the human papillomavirus (HPV).   Adenocarcinoma This type of cervical cancer starts in glandular cells that line the cervix. RISK FACTORS The risk of getting cancer of the cervix is related to your lifestyle, sexual history, health, and immune system. Risks for cervical cancer include:   Having a sexually transmitted viral infection. These include:  Chlamydia.   Herpes.   HPV.  Becoming sexually active before age 18 years.   Having more than one sexual partner or having sex with someone who has more than one sexual partner.   Not using condoms with sexual partners.   Having had cancer of the vagina or vulva.   Having a sexual partner who has or had cancer of the penis or who has had a sexual partner with cervical dysplasia or cervical cancer.   Using oral contraceptives (also called birth control pills).  Smoking.   Having a weakened immune system. For example, human immunodeficiency virus (HIV) or other immune deficiency disorders.   Being the daughter of a woman who took diethylstilbestrol (DES) during pregnancy.   Having a sister or mother who has had cancer of the cervix.   Being African American, Hispanic, Asian, or a woman from the [place].   A history of dysplasia of the cervix. SIGNS AND SYMPTOMS  Symptoms are usually not present in the early stages of  cervical cancer. Once the cancer invades the cervix and surrounding tissues, the woman may have:   Abnormal vaginal bleeding or menstrual bleeding that is longer or heavier than usual.   Bleeding after intercourse, douching, or a Pap test.   Vaginal bleeding following menopause.   Abnormal vaginal discharge.  Pelvic discomfort or pain.  An abnormal Pap test.  Pain during sexual intercourse. Symptoms of more advanced cervical cancer may include:   Loss of appetite or weight loss.   Tiredness (fatigue).   Back and leg pain.   Inability to control urination or bowel movements. DIAGNOSIS  A pelvic exam and Pap test are done to diagnose the condition. If abnormalities are found during the exam or Pap test, the Pap test may be repeated in 3 months, or your health care provider may do additional tests or procedures, such as:   A colposcopy This is a procedure that uses a special microscope that allows the health care provider to magnify and closely examine the cells of the cervix, vagina, and vulva.   Cervical biopsies This is a procedure where small tissue samples are taken from the cervix to be examined under a microscope by a specialist.   A cone biopsy This is a procedure to test for or remove cancerous tissue.  Other tests may be needed, including:   Cystoscopy.   Proctoscopy or sigmoidoscopy.   Ultrasound.   CT scan.   MRI.   Laparoscopy.  There are different stages of cervical cancer:   Stage   0, carcinoma in situ (CIS) This first stage of cancer is the last and most serious stage of dysplasia.   Stage 1 This means the tumor is in the uterus and cervix only.   Stage 2 This means the tumor has spread to the upper vagina. The cancer has spread beyond the uterus, but not to the pelvic walls or lower third of the vagina.   Stage 3 This means the tumor has invaded the side wall of the pelvis and the lower third of the vagina. Blockage of the tubes  that carry urine (ureters) from the tumor may cause urine to back up and cause the kidneys to swell (hydronephrosis).   Stage 4 This means the tumor has spread to the rectum or bladder. In the later part of this stage, it has also spread to distant organs, like the lungs.  TREATMENT  Treatment options can include:   Cone biopsy to remove the cancerous tissue.   Removal of the entire uterus and cervix.   Removal of the uterus, cervix, upper vagina, lymph nodes, and surrounding tissue (modified radical hysterectomy). The ovaries may be left in place or removed.   Medicines to treat cancer.   A combination of surgery, radiation, and chemotherapy.   Biological response modifiers. These are substances that help strengthen your immune system's fight against cancer or infection. They may be used in combination with chemotherapy.  HOME CARE INSTRUCTIONS   Get a gynecology exam and Pap test once every year or as directed by your health care provider.   Get the HPV vaccine.   Do not smoke.  Do not have sexual intercourse until your health care provider says it is okay.  Use a condom every time you have sex. SEEK MEDICAL CARE IF:   You have increased pelvic pain or pressure.   Your are becoming increasingly tired.   You have increased leg or back pain.   You have a fever.  You have abnormal bleeding or discharge.  You lose weight. SEEK IMMEDIATE MEDICAL CARE IF:   You cannot urinate.  You have blood in your urine.   You have blood or pressure with a bowel movement.   You develop severe back, stomach, or pelvic pain. Document Released: 04/01/2005 Document Revised: 12/02/2012 Document Reviewed: 09/23/2012 ExitCare Patient Information 2014 [employer].  

## 2013-09-07 NOTE — Progress Notes (Signed)
Consult Note: Gyn-Onc  Karen Daniels 38 y.o. female  CC:  Chief Complaint  Patient presents with  . Adenocarcinom    New Consult    HPI: [name] today in consultation at the request of Dr. Ihor Dow. Patient is a 38 year old G4P2 who has had vaginal discharge for about 2 years.  Two weeks ago she had heavy vaginal discharge which prompted a visit in the ER. At that time she also was having extreme fatique, SOB, lethargy and flu like symptoms. In the ER, she had an ultrasound which revealed: Both transabdominal and transvaginal ultrasound examinations of the pelvis were performed. Transabdominal technique was performed for global imaging of the pelvis including uterus, ovaries, adnexal regions, and pelvic cul-de-sac. It was necessary to proceed with endovaginal exam following the transabdominal exam to visualize the uterus, endometrium and bilateral ovaries.  COMPARISON: No priors.  FINDINGS:  Uterus  Measurements: 12.4 x 5.0 x 7.9 cm. The cervix appears masslike and is markedly enlarged measuring up to 7.3 x 4.8 x 7.4 cm, highly suspicious for advanced cervical neoplasm.  Endometrium  Thickness: 9.4 mm. No focal abnormality visualized.  Right ovary  Measurements: 3.8 x 2.4 x 3.0 cm. Normal appearance/no adnexal mass.  Left ovary  Measurements: 4.2 x 1.8 x 2.4 cm. Normal appearance/no adnexal mass.  Other findings  Trace volume of free fluid in the cul-de-sac  IMPRESSION:  1. Findings, as above, highly concerning for cervical carcinoma. Correlation with physical examination and referral to Gynecology is strongly recommended.  She was seen by Dr. Ihor Dow and a biopsy was performed that revealed: Cervix, biopsy INVASIVE ENDOCERVICAL ADENOCARCINOMA. PLEASE SEE COMMENT. Microscopic Comment The cervical biopsies show an invasive well endocervical adenocarcinoma that measures 8 mm laterally, 3.5 mm in depth. There is no angiolymphatic invasion identified.  Immunostains for p16 and Ki-67 will be performed and an addendum will follow.  She states that about 4 years ago she had a Mirena IUD placed. She had irregular bleeding, PCB, dyspareunia and discharge for about two years and finally had it removed 2 years ago. STD testing was negative at that time as was her pap smear. Once the IUD was removed, the bleeding and pain stopped but she had occasional PCB. She had an abnormal pap smear when she was about 38 years old. Work up was negative. She had a dx laparoscopy at that time for pelvic pain and negative colonoscopy. She stopped drinking drinks with aspartame and the pain stopped.  She quit smoking on 5/18 and has gained 3# since that time.  Review of Systems: Constitutional: Feels tired with no energy during the day. Denies fever. Skin: No rash Cardiovascular: No chest pain, shortness of breath, or edema  Pulmonary: No cough  Gastro Intestinal:  No nausea, vomiting, constipation, or diarrhea reported. No bright red blood per rectum or change in bowel movement.  Genitourinary: No frequency, urgency, or dysuria.   Musculoskeletal: + LBP Psychology: Feeling positive   Current Meds:  Outpatient Encounter Prescriptions as of 09/07/2013  Medication Sig  . oxyCODONE-acetaminophen (PERCOCET/ROXICET) 5-325 MG per tablet Take 2 tablets by mouth every 4 (four) hours as needed for severe pain.    Allergy: No Known Allergies  Social Hx:   History   Social History  . Marital Status: Divorced    Spouse Name: N/A    Number of Children: N/A  . Years of Education: N/A   Occupational History  . Not on file.   Social History Main Topics  .  Smoking status: Former Smoker -- 0.50 packs/day for 2 years    Types: Cigarettes    Quit date: 08/30/2013  . Smokeless tobacco: Not on file  . Alcohol Use: Yes     Comment: occasionally  . Drug Use: No  . Sexual Activity: Yes   Other Topics Concern  . Not on file   Social History Narrative  . No  narrative on file    Past Surgical Hx:  Past Surgical History  Procedure Laterality Date  . Diagnostic laparoscopy      r/o endometriosis  . Wisdom tooth extraction    . Colposcopy      Past Medical Hx:  Past Medical History  Diagnosis Date  . Abnormal Pap smear   . 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 Anxiety   . Depression     Oncology Hx:    Cervical cancer   08/30/2013 Initial Diagnosis Cervical cancer    Family Hx:  Family History  Problem Relation Age of Onset  . Heart disease Father   . COPD Father   . Diabetes Father   . Hypertension Father   . Arthritis Father   . Arthritis Mother   . Breast cancer Maternal Aunt   . Diabetes Maternal Grandmother   . Cancer Maternal Grandmother     skin cancer  . Heart disease Maternal Grandmother   . Breast cancer Paternal Grandmother   . Cancer Maternal Aunt     lung    Vitals:  Blood pressure 108/71, pulse 70, temperature 98.1 F (36.7 C), temperature source Oral, height $RemoveBefo'5\' 5"'[id number]$  (1.651 m), weight 146 lb 3.2 oz (66.316 kg), last menstrual period 08/15/2013.  Physical Exam: Well nourished, well developed female in NAD.  Neck: Supple, no lymphadenopathy, no thyromegaly.  Lungs: Clear to auscultation bilaterally.  Cardiovascular: Regular rate rhythm.  Abdomen: Soft, nontender, nondistended. No palpable masses. No hepatosplenomegaly.  Groins: No lymphadenopathy.  Extremities: No edema.  Pelvic: Normal female external genitalia. Immediately upon placing the speculum there is a large mass that fills the vagina.  On bimanual examination, the mass measures approximately 8 cm and fills the vagina. It is free of the vaginal sidewalls and the cervix is palpable at about 4-5 cm. On rectovaginal examination, there is no parametrial involvement and the sidewalls are free.  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 Lengthy discussion with the patient, her mother, and  her best friend regarding treatment options. I believe definitive treatment with chemotherapy and radiation is indicated. We discussed triple modality therapy including shrinking the tumor with chemotherapy then proceeding with surgery and then combination chemotherapy radiation. We discussed proceeding with radical hysterectomy and the need for postoperative adjuvant therapy. After discussion, she wishes to proceed with definitive chemoradiation. We discussed the need to obtain a PET scan for treatment planning purposes.  We discussed the general treatment and that she receive weekly cisplatin based chemotherapy. She also received daily radiation therapy Monday through Friday. We discussed external as well as brachytherapy. She has several family members who are currently being treated with chemotherapy here at [facility] as well as others who have been treated and she's quite familiar with many of the side effects related to chemotherapy. I discussed with her that alopecia is typically not experienced with weekly cisplatin however fatigue and some neuropathy would be a concern. 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 I believe that  she can continue her regular work schedule with some modifications for fatigue. Her questions as well as those of her support group were elicited and answered to her satisfaction. She will be scheduled for a PET scan. She knows that we will contact her with the results. We will coordinate appointments with both radiation oncology and medical oncology  Westlyn Glaza A. Alycia Rossetti, MD 09/07/2013, 1:47 PM

## 2013-09-09 ENCOUNTER — Encounter: Payer: Self-pay | Admitting: *Deleted

## 2013-09-20 ENCOUNTER — Encounter (HOSPITAL_COMMUNITY): Payer: Self-pay

## 2013-09-20 ENCOUNTER — Ambulatory Visit (HOSPITAL_COMMUNITY)
Admission: RE | Admit: 2013-09-20 | Discharge: 2013-09-20 | Disposition: A | Discharge status: Home / Self Care | Payer: Medicaid Other | Source: Ambulatory Visit | Attending: Gynecologic Oncology | Admitting: Gynecologic Oncology

## 2013-09-20 DIAGNOSIS — R599 Enlarged lymph nodes, unspecified: Secondary | ICD-10-CM | POA: Insufficient documentation

## 2013-09-20 DIAGNOSIS — C539 Malignant neoplasm of cervix uteri, unspecified: Secondary | ICD-10-CM

## 2013-09-20 LAB — GLUCOSE, CAPILLARY: Glucose-Capillary: 80 mg/dL (ref 70–99)

## 2013-09-20 MED ORDER — FLUDEOXYGLUCOSE F - 18 (FDG) INJECTION
8.8000 | Freq: Once | INTRAVENOUS | Status: AC | PRN
  Administered 2013-09-20: 8.8 via INTRAVENOUS

## 2013-09-24 ENCOUNTER — Telehealth: Payer: Self-pay | Admitting: *Deleted

## 2013-09-24 DIAGNOSIS — C539 Malignant neoplasm of cervix uteri, unspecified: Secondary | ICD-10-CM

## 2013-09-24 DIAGNOSIS — C770 Secondary and unspecified malignant neoplasm of lymph nodes of head, face and neck: Secondary | ICD-10-CM

## 2013-09-24 NOTE — Telephone Encounter (Signed)
per pof to sch pt appt-chemo edu & LL-sch-Mary RN states she adv pt-pt here with CP-adv i would note the acct

## 2013-09-24 NOTE — Telephone Encounter (Signed)
Referral to Dr.Livesay and Dr. Sondra Come for further treatment

## 2013-09-24 NOTE — Telephone Encounter (Signed)
Notified pt Dr. Alycia Rossetti will call with results from recent scan.

## 2013-09-27 ENCOUNTER — Telehealth: Payer: Self-pay | Admitting: Oncology

## 2013-09-27 NOTE — Telephone Encounter (Signed)
CALLED PATIENT AND GAVE NEW D/T FOR APPT 06/22 @ 3 W/DR. LIVESAY.

## 2013-09-28 NOTE — Progress Notes (Signed)
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 Alycia Rossetti with "vaginal discharge for about 2 years. Two weeks ago she had heavy vaginal discharge which prompted a visit in the ER. At that time she also was having extreme fatique, SOB, lethargy and flu like symptoms."  Biopsies revealed:   09/01/13 Diagnosis Cervix, biopsy INVASIVE ENDOCERVICAL ADENOCARCINOMA. PLEASE SEE COMMENT.  Past/Anticipated interventions by Gyn/Onc surgery, if any: no  Past/Anticipated interventions by medical oncology, if any: Apt with Dr. Marko Plume 10/04/13  Weight changes, if any: no  Bowel/Bladder complaints, if any: no  Nausea/Vomiting, if any: no  Pain issues, if any:  Has pain in her lower back that she is rating at a 3/10.  SAFETY ISSUES:  Prior radiation? no  Pacemaker/ICD? no  Possible current pregnancy? no  Is the patient on methotrexate? no  Current Complaints / other details:  Patient here with her mother.  She has 2 children.  Patient ws 14 years for her first menses.  26 years when she had her first child.  Used birth control pills for 8-10 years.

## 2013-09-29 ENCOUNTER — Other Ambulatory Visit: Payer: Medicaid Other

## 2013-09-29 ENCOUNTER — Encounter: Payer: Self-pay | Admitting: *Deleted

## 2013-09-29 ENCOUNTER — Ambulatory Visit: Payer: Medicaid Other | Admitting: Oncology

## 2013-09-29 NOTE — Progress Notes (Signed)
Spent time with patient and mother in chemotherapy education class discussing side effects of chemotherapy.   Spoke specifically about side effects of Cisplatin.  Reviewed neuropathy, nausea management, need to increase fluids, myelosupression precautions and ototoxicity .  Patient and mother very receptive to information and asked many good questions.  Teachback done

## 2013-09-30 ENCOUNTER — Ambulatory Visit
Admission: RE | Admit: 2013-09-30 | Discharge: 2013-09-30 | Disposition: A | Discharge status: Home / Self Care | Payer: Medicaid Other | Source: Ambulatory Visit | Attending: Radiation Oncology | Admitting: Radiation Oncology

## 2013-09-30 ENCOUNTER — Encounter: Payer: Self-pay | Admitting: Radiation Oncology

## 2013-09-30 ENCOUNTER — Other Ambulatory Visit: Payer: Self-pay | Admitting: Radiation Therapy

## 2013-09-30 VITALS — BP 107/71 | HR 74 | Temp 98.2°F | Ht 65.0 in | Wt 146.2 lb

## 2013-09-30 DIAGNOSIS — R5383 Other fatigue: Secondary | ICD-10-CM

## 2013-09-30 DIAGNOSIS — C775 Secondary and unspecified malignant neoplasm of intrapelvic lymph nodes: Secondary | ICD-10-CM | POA: Insufficient documentation

## 2013-09-30 DIAGNOSIS — C539 Malignant neoplasm of cervix uteri, unspecified: Secondary | ICD-10-CM

## 2013-09-30 DIAGNOSIS — Z87891 Personal history of nicotine dependence: Secondary | ICD-10-CM | POA: Insufficient documentation

## 2013-09-30 DIAGNOSIS — N898 Other specified noninflammatory disorders of vagina: Secondary | ICD-10-CM | POA: Insufficient documentation

## 2013-09-30 DIAGNOSIS — R5381 Other malaise: Secondary | ICD-10-CM | POA: Insufficient documentation

## 2013-09-30 DIAGNOSIS — C530 Malignant neoplasm of endocervix: Secondary | ICD-10-CM

## 2013-09-30 DIAGNOSIS — Z510 Encounter for antineoplastic radiation therapy: Secondary | ICD-10-CM | POA: Insufficient documentation

## 2013-09-30 NOTE — Progress Notes (Signed)
Please see the Nurse Progress Note in the MD Initial Consult Encounter for this patient. 

## 2013-09-30 NOTE — Progress Notes (Signed)
Radiation Oncology         [phone]) [phone] ________________________________  Initial outpatient Consultation  Name: Karen Daniels MRN: [id number]  Date: 09/30/2013  DOB: 1975-04-16  CC:No PCP Per Patient  Thompson Caul., MD   REFERRING PHYSICIAN: Thompson Caul., MD  DIAGNOSIS: Clinical stage IB-2 adenocarcinoma the cervix with PET positive lymph nodes in the pelvis region  HISTORY OF PRESENT ILLNESS::Karen Daniels is a 37 y.o. female seen out of the courtesy of Dr. Cleda Mccreedy for an opinion concerning radiation therapy as part of the management of patient's recently diagnosed adenocarcinoma the cervix. The patient presented with approximately a one to two-year history of  a primarily mucous vaginal discharge. She also noticed some pain in the right lower back/buttocks area which was also progressive. She denied any vaginal bleeding or other symptoms. Patient was seen by Dr. Erin Fulling and the patient was noted to have a large cervical mass. A biopsy of this area revealed invasive endocervical adenocarcinoma. The tumor was P-16 positive with a proliferation marker greater than 90% (Ki-67). Patient was referred to Dr. Duard Brady and on pelvic examination the patient was noted to have a approximately 8 cm cervical mass without parametrial involvement. Options for management were discussed with the patient including radical hysterectomy,possibly tri- modality treatment and a definitive course of chemoradiation.  A PET scan for staging workup was performed which did reveal positive pelvic lymph nodes and therefore a radical course of radiation and radiosensitizing chemotherapy was recommended for definitive management.   PREVIOUS RADIATION THERAPY: No  PAST MEDICAL HISTORY:  has a past medical history of Abnormal Pap smear; Herpes simplex without mention of complication; Anxiety; Depression; and Cervical cancer.    PAST SURGICAL HISTORY: Past Surgical History  Procedure Laterality Date  .  Diagnostic laparoscopy      r/o endometriosis  . Wisdom tooth extraction    . Colposcopy      FAMILY HISTORY: family history includes Arthritis in her father and mother; Breast cancer in her maternal aunt and paternal grandmother; COPD in her father; Cancer in her maternal aunt and maternal grandmother; Diabetes in her father and maternal grandmother; Heart disease in her father and maternal grandmother; Hypertension in her father.  SOCIAL HISTORY:  reports that she quit smoking about 4 weeks ago. Her smoking use included Cigarettes. She has a 1 pack-year smoking history. She does not have any smokeless tobacco history on file. She reports that she does not drink alcohol or use illicit drugs.  ALLERGIES: Review of patient's allergies indicates no known allergies.  MEDICATIONS:  Current Outpatient Prescriptions  Medication Sig Dispense Refill  . oxyCODONE-acetaminophen (PERCOCET/ROXICET) 5-325 MG per tablet Take 2 tablets by mouth every 4 (four) hours as needed for severe pain.  15 tablet  0  . [DISCONTINUED] gabapentin (NEURONTIN) 100 MG capsule Take 100 mg by mouth 4 (four) times daily as needed.      . [DISCONTINUED] norethindrone (MICRONOR,CAMILA,ERRIN) 0.35 MG tablet Take 1 tablet (0.35 mg total) by mouth daily.  1 Package  11   No current facility-administered medications for this encounter.    REVIEW OF SYSTEMS:  A 15 point review of systems is documented in the electronic medical record. This was obtained by the nursing staff. However, I reviewed this with the patient to discuss relevant findings and make appropriate changes. As above a several month history of vaginal discharge occasionally blood-tinged. She has had chronic right-sided low back pain which has slowly worsened over the past few months.  She denies any urination difficulties or bowel complaints specifically no hematuria or rectal bleeding.  Her energy level is good and her appetite is good. No history of constipation or  urinary retention.   PHYSICAL EXAM:  height is $RemoveB'5\' 5"'mrXCagXe$  (1.651 m) and weight is 146 lb 3.2 oz (66.316 kg). Her oral temperature is 98.2 F (36.8 C). Her blood pressure is 107/71 and her pulse is 74.   BP 107/71  Pulse 74  Temp(Src) 98.2 F (36.8 C) (Oral)  Ht $R'5\' 5"'vL$  (1.651 m)  Wt 146 lb 3.2 oz (66.316 kg)  BMI 24.33 kg/m2  LMP 09/15/2013  General Appearance:    Alert, cooperative, no distress, appears stated age, accompanied by mother on evaluation today, quite energetic   Head:    Normocephalic, without obvious abnormality, atraumatic  Eyes:    PERRL, conjunctiva/corneas clear, EOM's intact,       Nose:   Nares normal, septum midline, mucosa normal, no drainage    or sinus tenderness  Throat:   Lips, mucosa, and tongue normal; teeth and gums normal  Neck:   Supple, symmetrical, trachea midline, no adenopathy;    thyroid:  no enlargement/tenderness/nodules; no carotid   bruit or JVD  Back:     Symmetric, no curvature, ROM normal, no CVA tenderness  Lungs:     Clear to auscultation bilaterally, respirations unlabored  Chest Wall:    No tenderness or deformity   Heart:    Regular rate and rhythm, S1 and S2 normal, no murmur, rub   or gallop     Abdomen:     Soft, non-tender, bowel sounds active all four quadrants,    no masses, no organomegaly  Genitalia:   external genitalia are unremarkable. A mucus discharge is noted in the vaginal introitus. On speculum exam the cervical mass protruded into the vaginal vault to approximately 3 cm from the introitus. On bimanual examination the cervix was expanded and measures approximately 6-8 cm in size.  the vaginal sidewalls however are free of tumor as well as the vaginal fornices.. On rectovaginal examination there is no parametrial extension. The cervical mass moves freely. No sidewall involvement. The patient tolerated the exam quite well.   Rectal:    Normal tone      Extremities:   Extremities normal, atraumatic, no cyanosis or edema  Pulses:    2+ and symmetric all extremities  Skin:   Skin color, texture, turgor normal, no rashes or lesions  Lymph nodes:   Cervical, supraclavicular, and axillary nodes normal, no palpable inguinal adenopathy   Neurologic:    normal strength, sensation and reflexes    throughout     ECOG = 1  1 - Symptomatic but completely ambulatory (Restricted in physically strenuous activity but ambulatory and able to carry out work of a light or sedentary nature. For example, light housework, office work)  LABORATORY DATA:  Lab Results  Component Value Date   WBC 15.1* 08/29/2013   HGB 13.0 08/29/2013   HCT 37.3 08/29/2013   MCV 91.6 08/29/2013   PLT 302 08/29/2013   NEUTROABS 11.2* 08/29/2013   Lab Results  Component Value Date   NA 137 01/27/2009   K 4.7 01/27/2009   CL 105 01/27/2009   CO2 23 01/27/2009   GLUCOSE 73 01/27/2009   CREATININE 0.71 01/27/2009   CALCIUM 8.9 01/27/2009      RADIOGRAPHY: Nm Pet Image Initial (pi) Skull Base To Thigh  09/20/2013   CLINICAL DATA:  Initial treatment strategy for  cervical carcinoma.  EXAM: NUCLEAR MEDICINE PET SKULL BASE TO THIGH  TECHNIQUE: 8.8 mCi F-18 FDG was injected intravenously. Full-ring PET imaging was performed from the skull base to thigh after the radiotracer. CT data was obtained and used for attenuation correction and anatomic localization.  FASTING BLOOD GLUCOSE:  Value: 80 mg/dl  COMPARISON:  Pelvic ultrasound 08/29/2013.  FINDINGS: NECK  No hypermetabolic lymph nodes in the neck.  CHEST  No hypermetabolic mediastinal or hilar nodes. No suspicious pulmonary nodules on the CT scan. No acute consolidative airspace disease. No pleural effusions. Heart size is normal.  ABDOMEN/PELVIS  There is a large soft tissue mass in the low pelvis centered in the region of the cervix. This mass is difficult to discretely measure, but is estimated to be up to approximately 7.3 x 6.9 cm in greatest transaxial dimensions, and is diffusely hypermetabolic. This exerts  mass effect on adjacent structures, and grossly distorting the upper vagina, coming in close proximity to the base of the urinary bladder, and intimately associated with the anterior wall of the distal rectum. Several mildly enlarged and borderline enlarged lymph nodes are noted along the pelvic sidewall, predominantly internal iliac in distribution. The largest of these on the right side measures up to 1 cm (image 166 of series 4) and is hypermetabolic (SUVmax = 3.5), while the largest left-sided lymph node measures up to 1 cm in short axis (image 162 of series 4) and is also mildly hypermetabolic (SUVmax = 2.6). Borderline enlarged right inguinal lymph node (image 175 of series 4) measures 8 mm in short axis, but only demonstrates some low-level metabolism (SUVmax = 2.1).  No abnormal hypermetabolic activity within the liver, pancreas, adrenal glands, or spleen. Normal appendix. No definite hypermetabolic retroperitoneal lymphadenopathy.  SKELETON  No focal hypermetabolic activity to suggest skeletal metastasis.  IMPRESSION: 1. Large hypermetabolic mass in the region of the cervix with associated bilateral pelvic lymphadenopathy (predominantly in the internal iliac nodal chains), as detailed above, compatible with the reported diagnosis of cervical carcinoma with pelvic nodal metastasis. 2. Borderline enlarged right inguinal lymph node demonstrates some low-level metabolic activity. This is nonspecific, and may be reactive, although the presence of a right inguinal nodal metastasis is not excluded. 3. No other signs of distant metastatic disease involving the neck, chest or abdomen.   Electronically Signed   By: Vinnie Langton M.D.   On: 09/20/2013 13:02      IMPRESSION: Clinical stage IB-2 adenocarcinoma the cervix with PET positive lymph nodes in the pelvis region.  The patient would be a good candidate for a definitive course of radiation along with radiosensitizing chemotherapy. The patient's radiation  treatment would include external beam radiation therapy as well as intracavitary brachytherapy treatments. I discussed the treatment course side effects and potential toxicities of high-dose radiation therapy directed at the pelvis with the patient. She appears to understand and wishes to proceed with planned course of treatment.  PLAN: Simulation and planning tomorrow. Patient will undergo IV contrast to better outline the lymph nodes within the pelvis. She will also undergo BUN and creatinine prior to her IV contrast. Early next week the patient will be seen by medical oncology for additional bloodwork as well as a discussion of radiosensitizing chemotherapy. Patient will tentatively be ready to begin her combined modality therapy in early July  I spent 80 minutes minutes face to face with the patient and more than 50% of that time was spent in counseling and/or coordination of care.   ------------------------------------------------  Blair Promise, PhD, MD

## 2013-10-01 ENCOUNTER — Ambulatory Visit
Admission: RE | Admit: 2013-10-01 | Discharge: 2013-10-01 | Disposition: A | Discharge status: Home / Self Care | Payer: Medicaid Other | Source: Ambulatory Visit | Attending: Radiation Oncology | Admitting: Radiation Oncology

## 2013-10-01 VITALS — BP 104/64 | HR 73 | Temp 98.1°F | Ht 65.0 in | Wt 144.2 lb

## 2013-10-01 DIAGNOSIS — C539 Malignant neoplasm of cervix uteri, unspecified: Secondary | ICD-10-CM

## 2013-10-01 DIAGNOSIS — N898 Other specified noninflammatory disorders of vagina: Secondary | ICD-10-CM | POA: Diagnosis not present

## 2013-10-01 DIAGNOSIS — C530 Malignant neoplasm of endocervix: Secondary | ICD-10-CM | POA: Diagnosis not present

## 2013-10-01 DIAGNOSIS — Z87891 Personal history of nicotine dependence: Secondary | ICD-10-CM | POA: Diagnosis not present

## 2013-10-01 DIAGNOSIS — Z510 Encounter for antineoplastic radiation therapy: Secondary | ICD-10-CM | POA: Diagnosis not present

## 2013-10-01 DIAGNOSIS — C775 Secondary and unspecified malignant neoplasm of intrapelvic lymph nodes: Secondary | ICD-10-CM | POA: Diagnosis not present

## 2013-10-01 DIAGNOSIS — R5383 Other fatigue: Secondary | ICD-10-CM | POA: Diagnosis not present

## 2013-10-01 DIAGNOSIS — R5381 Other malaise: Secondary | ICD-10-CM | POA: Diagnosis not present

## 2013-10-01 LAB — BUN AND CREATININE (CC13)
BUN: 10 mg/dL (ref 7.0–26.0)
Creatinine: 0.8 mg/dL (ref 0.6–1.1)

## 2013-10-01 MED ORDER — SODIUM CHLORIDE 0.9 % IJ SOLN
10.0000 mL | Freq: Once | INTRAMUSCULAR | Status: AC
  Administered 2013-10-01: 10 mL via INTRAVENOUS

## 2013-10-01 NOTE — Progress Notes (Signed)
Patient's left ac IV removed intact.  Pressure applied.  Site is intact without bleeding.  Bandaid applied.  Patient tolerated well.

## 2013-10-01 NOTE — Progress Notes (Signed)
  Radiation Oncology         [phone] ________________________________  Name: Karen Daniels MRN: [id number]  Date: 10/01/2013  DOB: January 24, 1976  SIMULATION AND TREATMENT PLANNING NOTE  DIAGNOSIS: Clinical stage IB-2 adenocarcinoma the cervix with PET positive lymph nodes in the pelvis region   NARRATIVE:  The patient was brought to the [place] suite.  Identity was confirmed.  All relevant records and images related to the planned course of therapy were reviewed.  The patient freely provided informed written consent to proceed with treatment after reviewing the details related to the planned course of therapy. The consent form was witnessed and verified by the simulation staff.  Then, the patient was set-up in a stable reproducible  supine position for radiation therapy.  CT images were obtained.  Surface markings were placed.  The CT images were loaded into the planning software.  Then the target and avoidance structures were contoured.  Treatment planning then occurred.  The radiation prescription was entered and confirmed.  Then, I designed and supervised the construction of a total of 5 medically necessary complex treatment devices.  I have requested : 3D Simulation  I have requested a DVH of the following structures: Cervical mass, uterus, bladder, rectum and small bowel.  I have ordered:dose calc.  PLAN:  The patient will receive 45 Gy in 25 fractions along with radiosensitizing chemotherapy. She at a later date will also be considered for an IMRT boost to PET positive lymph nodes in the pelvis region.  She will also receive 5 high-dose rate intracavitary brachtherapy treatments directed at the cervix.  ________________________________   Special treatment procedure note  The patient will be receiving weekly radiosensitizing chemotherapy. Given the increased potential for toxicities as well as the necessity for close monitoring of the patient and bloodwork, this constitutes  a special treatment procedure. -----------------------------------  Blair Promise, PhD, MD

## 2013-10-03 ENCOUNTER — Other Ambulatory Visit: Payer: Self-pay | Admitting: Oncology

## 2013-10-03 DIAGNOSIS — C539 Malignant neoplasm of cervix uteri, unspecified: Secondary | ICD-10-CM

## 2013-10-04 ENCOUNTER — Encounter: Payer: Self-pay | Admitting: Oncology

## 2013-10-04 ENCOUNTER — Telehealth: Payer: Self-pay | Admitting: *Deleted

## 2013-10-04 ENCOUNTER — Ambulatory Visit (HOSPITAL_BASED_OUTPATIENT_CLINIC_OR_DEPARTMENT_OTHER): Payer: Medicaid Other | Admitting: Oncology

## 2013-10-04 ENCOUNTER — Ambulatory Visit: Payer: Medicaid Other

## 2013-10-04 ENCOUNTER — Other Ambulatory Visit (HOSPITAL_BASED_OUTPATIENT_CLINIC_OR_DEPARTMENT_OTHER): Payer: Medicaid Other

## 2013-10-04 VITALS — BP 113/71 | HR 74 | Temp 98.1°F | Resp 18 | Ht 65.0 in | Wt 147.6 lb

## 2013-10-04 DIAGNOSIS — Z803 Family history of malignant neoplasm of breast: Secondary | ICD-10-CM

## 2013-10-04 DIAGNOSIS — C775 Secondary and unspecified malignant neoplasm of intrapelvic lymph nodes: Secondary | ICD-10-CM

## 2013-10-04 DIAGNOSIS — C539 Malignant neoplasm of cervix uteri, unspecified: Secondary | ICD-10-CM

## 2013-10-04 DIAGNOSIS — C530 Malignant neoplasm of endocervix: Secondary | ICD-10-CM

## 2013-10-04 DIAGNOSIS — Z801 Family history of malignant neoplasm of trachea, bronchus and lung: Secondary | ICD-10-CM

## 2013-10-04 LAB — COMPREHENSIVE METABOLIC PANEL (CC13)
ALT: 11 U/L (ref 0–55)
AST: 16 U/L (ref 5–34)
Albumin: 3.8 g/dL (ref 3.5–5.0)
Alkaline Phosphatase: 73 U/L (ref 40–150)
Anion Gap: 8 mEq/L (ref 3–11)
BILIRUBIN TOTAL: 0.26 mg/dL (ref 0.20–1.20)
BUN: 15.2 mg/dL (ref 7.0–26.0)
CHLORIDE: 107 meq/L (ref 98–109)
CO2: 25 mEq/L (ref 22–29)
Calcium: 9.2 mg/dL (ref 8.4–10.4)
Creatinine: 0.8 mg/dL (ref 0.6–1.1)
Glucose: 64 mg/dl — ABNORMAL LOW (ref 70–140)
Potassium: 4 mEq/L (ref 3.5–5.1)
Sodium: 140 mEq/L (ref 136–145)
Total Protein: 7.1 g/dL (ref 6.4–8.3)

## 2013-10-04 LAB — CBC WITH DIFFERENTIAL/PLATELET
BASO%: 0.2 % (ref 0.0–2.0)
Basophils Absolute: 0 10*3/uL (ref 0.0–0.1)
EOS%: 0.7 % (ref 0.0–7.0)
Eosinophils Absolute: 0.1 10*3/uL (ref 0.0–0.5)
HEMATOCRIT: 37.2 % (ref 34.8–46.6)
HGB: 12.5 g/dL (ref 11.6–15.9)
LYMPH#: 2.3 10*3/uL (ref 0.9–3.3)
LYMPH%: 23.9 % (ref 14.0–49.7)
MCH: 30.3 pg (ref 25.1–34.0)
MCHC: 33.6 g/dL (ref 31.5–36.0)
MCV: 90.1 fL (ref 79.5–101.0)
MONO#: 0.7 10*3/uL (ref 0.1–0.9)
MONO%: 7.1 % (ref 0.0–14.0)
NEUT#: 6.4 10*3/uL (ref 1.5–6.5)
NEUT%: 68.1 % (ref 38.4–76.8)
Platelets: 322 10*3/uL (ref 145–400)
RBC: 4.13 10*6/uL (ref 3.70–5.45)
RDW: 12.9 % (ref 11.2–14.5)
WBC: 9.5 10*3/uL (ref 3.9–10.3)

## 2013-10-04 LAB — MAGNESIUM (CC13): Magnesium: 2.3 mg/dl (ref 1.5–2.5)

## 2013-10-04 NOTE — Telephone Encounter (Signed)
Per staff message and POF I have scheduled appts. Advised scheduler of appts. JMW  

## 2013-10-04 NOTE — Patient Instructions (Signed)
For rash on hand: hydrocortisone cream 3x daily, claritin (lortadine) 10 mg daily which will not make you sleepy, benadryl (diphenhydramine) 25 mg at bedtime and can repeat in 4-6 hrs if needed which will make you sleepy.   We will send nausea medicines to [facility] on [address]: Zofran (ondansetron) 8 mg: 1-2 every 12 hrs as needed for nausea. This will not make you drowsy. Rarely this can cause headache, so let Dr Marko Plume know if so.  Compazine (prochlorperazine) 10 mg: 1 every 6 hrs as needed for nausea.   Be sure to drink all of the fluids as instructed prior to each cisplatinum chemotherapy treatment.    For constipation: senokotS or pharmacy equivalent 1-2 tablets once or twice daily, or miralax (glycolax) 1/2 - 1 capful daily   Call if you have questions or concerns between visits    [phone]

## 2013-10-04 NOTE — Progress Notes (Signed)
[facility] NEW PATIENT EVALUATION   Name: Karen Daniels Date: 10/04/2013 MRN: [id number] DOB: Dec 09, 1975  REFERRING PHYSICIAN: Nancy Marus CC: J.Kinard, C.Harraway-Smith   REASON FOR REFERRAL: new diagnosis cervical cancer, for sensitizing chemotherapy with RT   HISTORY OF PRESENT ILLNESS:Karen Daniels is a 38 y.o. female who is seen in consultation, together with mother and aunt, at the request of Dr Alycia Rossetti, for consideration of sensitizing chemotherapy with radiation for newly diagnosed clinical IB2 adenocarcinoma of cervix, with PET positive pelvic nodes.  Dr Sondra Come plans to begin her radiation treatments on 10-11-13. She attended chemotherapy education class for weekly CDDP prior to this visit.  Patient has not had regular medical care in some years. She has had some bleeding with intercourse as well as regular menstrual periods, and recently increased vaginal discharge with fatigue, lethargy, SOB and flu-like symptoms. She was seen at ED on 08-29-13 with finding of large cervical mass, friable on physical exam and confirmed by [place]. The vaginal discharge had few clue cells, negative for trich and yeast. Pregnancy test was negative. She was seen by Dr Ihor Dow, with cervical biopsy ( [id number]) with invasive adenocarcinoma of cervix, no LVSI noted. She saw Dr Alycia Rossetti on 09-07-13, with exam remarkable for large mass filling vagina ~ 8 cm, free of vaginal sidewalls, cervix ~ 4-5 cm, no parametrial involvement, for clinical IB2 adenocarcinoma of cervix. After discussion options, decision was made to use definitive chemoradiation. PET showed positive pelvic nodes, with 7.3 x 6.9 cm cervical mass and no uptake chest/liver/skeleton or other. She had consultation with Dr Sondra Come on 09-30-13, with simulation on 10-01-13, anticipating 45 Gy in 25 fractions with radiosensitizing chemotherapy, then consideration of IMRT boost to PET positive nodes in pelvis, and 5 high-dose rate intracavitary  brachtherapy treatments directed at the cervix. The fatigue and flu-symptoms resolved without intervention.   REVIEW OF SYSTEMS as above, also: No HA or other neurologic symptoms. Good visual acuity. No environmental allergies. No difficulty hearing. No active dental problems. No thyroid symptoms. No SOB or other respiratory symptoms now. No changes in breasts/ no mammograms. Weight at usual. Appetite good without nausea or vomiting. No BM x 3 days, not using laxative. Bladder ok. No LE swelling. No other bleeding. Intermittent numbness in fingers x 5 years, seems positional. No difficulty with IV with procedures to date. Puritic rash on right wrist x several days.  Remainder of full 10 point review of systems negative.   ALLERGIES: Review of patient's allergies indicates no known allergies.  PAST MEDICAL/ SURGICAL HISTORY:    Diagnostic laparoscopy age 7 for suspected endometriosis Colposcopy also age 59 Herpes simplex Anxiety/ depression G2 Mirena IUD x 3 years, removed 2 years ago at [facility]: reviewed as listed now in EMR: none. Suggested Senokot S or miralax prn constipation. Antiemetics to be sent to pharmacy  PHARMACY [facility]   SOCIAL HISTORY: from [place], lives with mother. Shares custody of 2 children ages 4 and 51 with ex-husband, alternate weeks.Self employed residential cleaning. Smoked 1/2 - 1 PPD x 7 years, DCd 08-30-13.   FAMILY HISTORY:  Maternal aunt with breast cancer, has recently been on chemotherapy by Dr Humphrey Rolls Father COPD, CM, heart disease Mother arthritis Maternal aunt lung ca (smoker) Brother healthy 2 children healthy          PHYSICAL EXAM:  height is $RemoveB'5\' 5"'kWOKBGss$  (1.651 m) and weight is 147 lb 9.6 oz (66.951 kg). Her oral temperature is 98.1 F (36.7 C).  Her blood pressure is 113/71 and her pulse is 74. Her respiration is 18.  Alert, pleasant, cooperative, looks stated age, well developed and appears well  nourished.  HEENT:normal hair pattern. PERRL, not icteric. Oral mucosa moist and clear, no obvious dental problems. Neck supple without JVD or thyroid mass.  RESPIRATORY:lungs clear to A and P.   CARDIAC/ VASCULAR:heart RRR, no murmur or gallop, clear heart sounds. Peripheral pulses intact and symmetrical  ABDOMEN: soft, not tender, not distended, few BS. No mass or HSM  LYMPH NODES:no cervical, supraclavicular, axillary or inguinal adenopathy  BREASTS:bilaterally without dominant mass, skin or nipple findings. Axillae benign  NEUROLOGIC:CN, motor, sensory, cerebellar nonfocal  SKIN:right wrist and dorsum hand with excoriated areas, no vesicles, no bleeding or suprainfection  MUSCULOSKELETAL:back nontender. Symmetrical muscle mass. LE without CCE, no cords or tenderness    LABORATORY DATA:  Results for orders placed in visit on 10/04/13 (from the past 48 hour(s))  CBC WITH DIFFERENTIAL     Status: None   Collection Time    10/04/13  2:49 PM      Result Value Ref Range   WBC 9.5  3.9 - 10.3 10e3/uL   NEUT# 6.4  1.5 - 6.5 10e3/uL   HGB 12.5  11.6 - 15.9 g/dL   HCT 37.2  34.8 - 46.6 %   Platelets 322  145 - 400 10e3/uL   MCV 90.1  79.5 - 101.0 fL   MCH 30.3  25.1 - 34.0 pg   MCHC 33.6  31.5 - 36.0 g/dL   RBC 4.13  3.70 - 5.45 10e6/uL   RDW 12.9  11.2 - 14.5 %   lymph# 2.3  0.9 - 3.3 10e3/uL   MONO# 0.7  0.1 - 0.9 10e3/uL   Eosinophils Absolute 0.1  0.0 - 0.5 10e3/uL   Basophils Absolute 0.0  0.0 - 0.1 10e3/uL   NEUT% 68.1  38.4 - 76.8 %   LYMPH% 23.9  14.0 - 49.7 %   MONO% 7.1  0.0 - 14.0 %   EOS% 0.7  0.0 - 7.0 %   BASO% 0.2  0.0 - 2.0 %  MAGNESIUM (CC13)     Status: None   Collection Time    10/04/13  2:49 PM      Result Value Ref Range   Magnesium 2.3  1.5 - 2.5 mg/dl  COMPREHENSIVE METABOLIC PANEL (HU76)     Status: Abnormal   Collection Time    10/04/13  2:49 PM      Result Value Ref Range   Sodium 140  136 - 145 mEq/L   Potassium 4.0  3.5 - 5.1 mEq/L    Chloride 107  98 - 109 mEq/L   CO2 25  22 - 29 mEq/L   Glucose 64 (*) 70 - 140 mg/dl   BUN 15.2  7.0 - 26.0 mg/dL   Creatinine 0.8  0.6 - 1.1 mg/dL   Total Bilirubin 0.26  0.20 - 1.20 mg/dL   Alkaline Phosphatase 73  40 - 150 U/L   AST 16  5 - 34 U/L   ALT 11  0 - 55 U/L   Total Protein 7.1  6.4 - 8.3 g/dL   Albumin 3.8  3.5 - 5.0 g/dL   Calcium 9.2  8.4 - 10.4 mg/dL   Anion Gap 8  3 - 11 mEq/L      PATHOLOGY: AMOS, GABER Collected: 09/01/2013 Client: [facility] Accession: [id number] Received: 09/02/2013 Lavonia Drafts, MD REPORT OF SURGICAL PATHOLOGY  ADDITIONAL INFORMATION: The tumor cells are strongly and diffusely positive for p16( HR HPV surrogate marker) and proliferative marker Ki-67 (>90%). The findings support the diagnosis. Aldona Bar MD Pathologist, Electronic Signature ( Signed 09/08/2013) FINAL DIAGNOSIS Diagnosis Cervix, biopsy INVASIVE ENDOCERVICAL ADENOCARCINOMA. PLEASE SEE COMMENT. Microscopic Comment The cervical biopsies show an invasive well endocervical adenocarcinoma that measures 8 mm laterally, 3.5 mm in depth. There is no angiolymphatic invasion identified. Immunostains for p16 and Ki-67 will be performed and an addendum will follow.  RADIOGRAPHY: EXAM:  TRANSABDOMINAL AND TRANSVAGINAL ULTRASOUND OF PELVIS   08-29-13 TECHNIQUE:  Both transabdominal and transvaginal ultrasound examinations of the  pelvis were performed. Transabdominal technique was performed for  global imaging of the pelvis including uterus, ovaries, adnexal  regions, and pelvic cul-de-sac. It was necessary to proceed with  endovaginal exam following the transabdominal exam to visualize the  uterus, endometrium and bilateral ovaries.  COMPARISON: No priors.  FINDINGS:  Uterus  Measurements: 12.4 x 5.0 x 7.9 cm. The cervix appears masslike and  is markedly enlarged measuring up to 7.3 x 4.8 x 7.4 cm, highly  suspicious for advanced  cervical neoplasm.  Endometrium  Thickness: 9.4 mm. No focal abnormality visualized.  Right ovary  Measurements: 3.8 x 2.4 x 3.0 cm. Normal appearance/no adnexal mass.  Left ovary  Measurements: 4.2 x 1.8 x 2.4 cm. Normal appearance/no adnexal mass.  Other findings  Trace volume of free fluid in the cul-de-sac  IMPRESSION:  1. Findings, as above, highly concerning for cervical carcinoma.  Correlation with physical examination and referral to Gynecology is  strongly recommended.     :  CHEST 2 VIEW 08-29-13 COMPARISON: None.  FINDINGS:  The heart size and mediastinal contours are within normal limits.  Both lungs are clear. The visualized skeletal structures are  unremarkable.  IMPRESSION:  No active cardiopulmonary disease.      NUCLEAR MEDICINE PET SKULL BASE TO THIGH   09-20-13  COMPARISON: Pelvic ultrasound 08/29/2013.  FINDINGS:  NECK  No hypermetabolic lymph nodes in the neck.  CHEST  No hypermetabolic mediastinal or hilar nodes. No suspicious  pulmonary nodules on the CT scan. No acute consolidative airspace  disease. No pleural effusions. Heart size is normal.  ABDOMEN/PELVIS  There is a large soft tissue mass in the low pelvis centered in the  region of the cervix. This mass is difficult to discretely measure,  but is estimated to be up to approximately 7.3 x 6.9 cm in greatest  transaxial dimensions, and is diffusely hypermetabolic. This exerts  mass effect on adjacent structures, and grossly distorting the upper  vagina, coming in close proximity to the base of the urinary  bladder, and intimately associated with the anterior wall of the  distal rectum. Several mildly enlarged and borderline enlarged lymph  nodes are noted along the pelvic sidewall, predominantly internal  iliac in distribution. The largest of these on the right side  measures up to 1 cm (image 166 of series 4) and is hypermetabolic  (SUVmax = 3.5), while the largest left-sided lymph node  measures up  to 1 cm in short axis (image 162 of series 4) and is also mildly  hypermetabolic (SUVmax = 2.6). Borderline enlarged right inguinal  lymph node (image 175 of series 4) measures 8 mm in short axis, but  only demonstrates some low-level metabolism (SUVmax = 2.1).  No abnormal hypermetabolic activity within the liver, pancreas,  adrenal glands, or spleen. Normal appendix. No definite  hypermetabolic retroperitoneal lymphadenopathy.  SKELETON  No focal hypermetabolic activity to suggest skeletal metastasis.  IMPRESSION:  1. Large hypermetabolic mass in the region of the cervix with  associated bilateral pelvic lymphadenopathy (predominantly in the  internal iliac nodal chains), as detailed above, compatible with the  reported diagnosis of cervical carcinoma with pelvic nodal  metastasis.  2. Borderline enlarged right inguinal lymph node demonstrates some  low-level metabolic activity. This is nonspecific, and may be  reactive, although the presence of a right inguinal nodal metastasis  is not excluded.  3. No other signs of distant metastatic disease involving the neck,  chest or abdomen.       DISCUSSION: we have reviewed all of history and work up to date as above. We have discussed rationale for sensitizing chemotherapy with definitive radiation, usual course of this outpatient treatment, and need for good hydration. We have discussed possible side effects of the chemotherapy and have discussed antiemetics. Peripheral veins look very adequate for this chemotherapy, so no PAC needed.They understand that I will follow closely with the chemotherapy, including labs prior to each treatment, and that they can contact this office at any time if questions or concerns. She understands that she should not risk chance of pregnancy during this treatment.     IMPRESSION / PLAN:  1.clinical IB2 adenocarcinoma of cervix, with PET + pelvic nodes (IIIB) in otherwise healthy 38 yo lady:  will begin weekly sensitizing CDDP with radiation on 10-11-13. I expect she will have  5-6 cycles of weekly CDDP depending on dates of radiation. She will have CBC, CMET, Mg day of treatments (use Mg from today for cycle 1). I will see her back coordinating with RT treatment times if schedule allows. 2.rash right wrist: not related to the cancer. Can use claritin/benadryl and OTC hydrocortisone cream prn. 3.recently discontinued tobacco: patient reports that she is doing well with this and does not need patches etc. Encouraged to continue.    Patient and accompanying individuals have had questions answered to their satisfaction and are in agreement with plan above. They can contact this office for questions or concerns at any time prior to next scheduled visit.  Time spent 45 min, including >50% discussion and coordination of care. Chemotherapy and lab orders entered. MD visits to be coordinated with RT times.  Consent, antiemetics, prior auth done.  Arshad Oberholzer P, MD 10/04/2013 10:03 PM

## 2013-10-06 DIAGNOSIS — Z510 Encounter for antineoplastic radiation therapy: Secondary | ICD-10-CM | POA: Diagnosis not present

## 2013-10-08 ENCOUNTER — Ambulatory Visit
Admission: RE | Admit: 2013-10-08 | Discharge: 2013-10-08 | Disposition: A | Discharge status: Home / Self Care | Payer: Medicaid Other | Source: Ambulatory Visit | Attending: Radiation Oncology | Admitting: Radiation Oncology

## 2013-10-08 ENCOUNTER — Other Ambulatory Visit: Payer: Self-pay | Admitting: *Deleted

## 2013-10-08 DIAGNOSIS — C539 Malignant neoplasm of cervix uteri, unspecified: Secondary | ICD-10-CM

## 2013-10-08 DIAGNOSIS — Z510 Encounter for antineoplastic radiation therapy: Secondary | ICD-10-CM | POA: Diagnosis not present

## 2013-10-10 ENCOUNTER — Other Ambulatory Visit: Payer: Self-pay | Admitting: Oncology

## 2013-10-10 NOTE — Progress Notes (Signed)
  Radiation Oncology         [phone] ________________________________  Name: Karen Daniels MRN: [id number]  Date: 10/08/2013  DOB: 15-Jul-1975  Simulation Verification Note   NARRATIVE: The patient was brought to the treatment unit and placed in the planned treatment position. The clinical setup was verified. Then port films were obtained and uploaded to the radiation oncology medical record software.  The treatment beams were carefully compared against the planned radiation fields. The position, location, and shape of the radiation fields was reviewed. The targeted volume of tissue appears to be appropriately covered by the radiation beams. Based on my personal review, I approved the simulation verification. The patient's treatment will proceed as planned.  ________________________________   Jodelle Gross, MD, PhD

## 2013-10-11 ENCOUNTER — Telehealth: Payer: Self-pay | Admitting: Oncology

## 2013-10-11 ENCOUNTER — Other Ambulatory Visit: Payer: Self-pay | Admitting: *Deleted

## 2013-10-11 ENCOUNTER — Other Ambulatory Visit (HOSPITAL_BASED_OUTPATIENT_CLINIC_OR_DEPARTMENT_OTHER): Payer: Medicaid Other

## 2013-10-11 ENCOUNTER — Ambulatory Visit
Admission: RE | Admit: 2013-10-11 | Discharge: 2013-10-11 | Disposition: A | Discharge status: Home / Self Care | Payer: Medicaid Other | Source: Ambulatory Visit | Attending: Radiation Oncology | Admitting: Radiation Oncology

## 2013-10-11 ENCOUNTER — Ambulatory Visit (HOSPITAL_BASED_OUTPATIENT_CLINIC_OR_DEPARTMENT_OTHER): Payer: Medicaid Other

## 2013-10-11 VITALS — BP 118/72 | HR 72 | Temp 97.8°F

## 2013-10-11 DIAGNOSIS — C530 Malignant neoplasm of endocervix: Secondary | ICD-10-CM

## 2013-10-11 DIAGNOSIS — C539 Malignant neoplasm of cervix uteri, unspecified: Secondary | ICD-10-CM

## 2013-10-11 DIAGNOSIS — Z510 Encounter for antineoplastic radiation therapy: Secondary | ICD-10-CM | POA: Diagnosis not present

## 2013-10-11 DIAGNOSIS — Z5111 Encounter for antineoplastic chemotherapy: Secondary | ICD-10-CM

## 2013-10-11 LAB — CBC WITH DIFFERENTIAL/PLATELET
BASO%: 0.6 % (ref 0.0–2.0)
Basophils Absolute: 0 10*3/uL (ref 0.0–0.1)
EOS ABS: 0.1 10*3/uL (ref 0.0–0.5)
EOS%: 1.2 % (ref 0.0–7.0)
HCT: 37.1 % (ref 34.8–46.6)
HGB: 12.2 g/dL (ref 11.6–15.9)
LYMPH%: 24.6 % (ref 14.0–49.7)
MCH: 30.6 pg (ref 25.1–34.0)
MCHC: 32.9 g/dL (ref 31.5–36.0)
MCV: 93.1 fL (ref 79.5–101.0)
MONO#: 0.5 10*3/uL (ref 0.1–0.9)
MONO%: 8.9 % (ref 0.0–14.0)
NEUT%: 64.7 % (ref 38.4–76.8)
NEUTROS ABS: 3.9 10*3/uL (ref 1.5–6.5)
PLATELETS: 344 10*3/uL (ref 145–400)
RBC: 3.99 10*6/uL (ref 3.70–5.45)
RDW: 13.6 % (ref 11.2–14.5)
WBC: 6 10*3/uL (ref 3.9–10.3)
lymph#: 1.5 10*3/uL (ref 0.9–3.3)

## 2013-10-11 LAB — COMPREHENSIVE METABOLIC PANEL (CC13)
ALBUMIN: 3.7 g/dL (ref 3.5–5.0)
ALT: 13 U/L (ref 0–55)
AST: 18 U/L (ref 5–34)
Alkaline Phosphatase: 61 U/L (ref 40–150)
Anion Gap: 8 mEq/L (ref 3–11)
BUN: 6.9 mg/dL — AB (ref 7.0–26.0)
CO2: 24 mEq/L (ref 22–29)
CREATININE: 0.7 mg/dL (ref 0.6–1.1)
Calcium: 9 mg/dL (ref 8.4–10.4)
Chloride: 105 mEq/L (ref 98–109)
GLUCOSE: 89 mg/dL (ref 70–140)
Potassium: 3.9 mEq/L (ref 3.5–5.1)
Sodium: 137 mEq/L (ref 136–145)
Total Bilirubin: 0.38 mg/dL (ref 0.20–1.20)
Total Protein: 6.4 g/dL (ref 6.4–8.3)

## 2013-10-11 MED ORDER — POTASSIUM CHLORIDE 2 MEQ/ML IV SOLN
Freq: Once | INTRAVENOUS | Status: AC
  Administered 2013-10-11: 10:00:00 via INTRAVENOUS
  Filled 2013-10-11: qty 10

## 2013-10-11 MED ORDER — ONDANSETRON HCL 8 MG PO TABS: 8.0000 mg | ORAL_TABLET | Freq: Two times a day (BID) | ORAL | Status: DC | PRN

## 2013-10-11 MED ORDER — LORAZEPAM 0.5 MG PO TABS: ORAL_TABLET | ORAL | Status: DC

## 2013-10-11 MED ORDER — SODIUM CHLORIDE 0.9 % IV SOLN
40.0000 mg/m2 | Freq: Once | INTRAVENOUS | Status: AC
  Administered 2013-10-11: 70 mg via INTRAVENOUS
  Filled 2013-10-11: qty 70

## 2013-10-11 MED ORDER — ONDANSETRON HCL 8 MG PO TABS: ORAL_TABLET | ORAL | Status: DC

## 2013-10-11 MED ORDER — ONDANSETRON 16 MG/50ML IVPB (CHCC)
16.0000 mg | Freq: Once | INTRAVENOUS | Status: AC
Start: 2013-10-11 — End: 2013-10-11
  Administered 2013-10-11: 16 mg via INTRAVENOUS

## 2013-10-11 MED ORDER — FOSAPREPITANT DIMEGLUMINE INJECTION 150 MG
150.0000 mg | Freq: Once | INTRAVENOUS | Status: AC
  Administered 2013-10-11: 150 mg via INTRAVENOUS
  Filled 2013-10-11: qty 5

## 2013-10-11 MED ORDER — DEXAMETHASONE SODIUM PHOSPHATE 20 MG/5ML IJ SOLN
12.0000 mg | Freq: Once | INTRAMUSCULAR | Status: AC
  Administered 2013-10-11: 12 mg via INTRAVENOUS

## 2013-10-11 MED ORDER — DEXAMETHASONE SODIUM PHOSPHATE 20 MG/5ML IJ SOLN
INTRAMUSCULAR | Status: AC
  Filled 2013-10-11: qty 5

## 2013-10-11 MED ORDER — ONDANSETRON 16 MG/50ML IVPB (CHCC)
INTRAVENOUS | Status: AC
  Filled 2013-10-11: qty 16

## 2013-10-11 MED ORDER — SODIUM CHLORIDE 0.9 % IV SOLN
Freq: Once | INTRAVENOUS | Status: AC
  Administered 2013-10-11: 09:00:00 via INTRAVENOUS

## 2013-10-11 NOTE — Telephone Encounter (Signed)
, °

## 2013-10-11 NOTE — Patient Instructions (Signed)
[facility] Discharge Instructions for Patients Receiving Chemotherapy  Today you received the following chemotherapy agents cisplatin.    To help prevent nausea and vomiting after your treatment, we encourage you to take your nausea medication as directed.     If you develop nausea and vomiting that is not controlled by your nausea medication, call the clinic.   BELOW ARE SYMPTOMS THAT SHOULD BE REPORTED IMMEDIATELY:  *FEVER GREATER THAN 100.5 F  *CHILLS WITH OR WITHOUT FEVER  NAUSEA AND VOMITING THAT IS NOT CONTROLLED WITH YOUR NAUSEA MEDICATION  *UNUSUAL SHORTNESS OF BREATH  *UNUSUAL BRUISING OR BLEEDING  TENDERNESS IN MOUTH AND THROAT WITH OR WITHOUT PRESENCE OF ULCERS  *URINARY PROBLEMS  *BOWEL PROBLEMS  UNUSUAL RASH Items with * indicate a potential emergency and should be followed up as soon as possible.  Feel free to call the clinic you have any questions or concerns. The clinic phone number is [phone].   Cisplatin injection What is this medicine? CISPLATIN (SIS pla tin) is a chemotherapy drug. It targets fast dividing cells, like cancer cells, and causes these cells to die. This medicine is used to treat many types of cancer like bladder, ovarian, and testicular cancers. This medicine may be used for other purposes; ask your health care provider or pharmacist if you have questions. COMMON BRAND NAME(S): Platinol, Platinol -AQ What should I tell my health care provider before I take this medicine? They need to know if you have any of these conditions: -blood disorders -hearing problems -kidney disease -recent or ongoing radiation therapy -an unusual or allergic reaction to cisplatin, carboplatin, other chemotherapy, other medicines, foods, dyes, or preservatives -pregnant or trying to get pregnant -breast-feeding How should I use this medicine? This drug is given as an infusion into a vein. It is administered in a hospital or clinic by a  specially trained health care professional. Talk to your pediatrician regarding the use of this medicine in children. Special care may be needed. Overdosage: If you think you have taken too much of this medicine contact a poison control center or emergency room at once. NOTE: This medicine is only for you. Do not share this medicine with others. What if I miss a dose? It is important not to miss a dose. Call your doctor or health care professional if you are unable to keep an appointment. What may interact with this medicine? -dofetilide -foscarnet -medicines for seizures -medicines to increase blood counts like filgrastim, pegfilgrastim, sargramostim -probenecid -pyridoxine used with altretamine -rituximab -some antibiotics like amikacin, gentamicin, neomycin, polymyxin B, streptomycin, tobramycin -sulfinpyrazone -vaccines -zalcitabine Talk to your doctor or health care professional before taking any of these medicines: -acetaminophen -aspirin -ibuprofen -ketoprofen -naproxen This list may not describe all possible interactions. Give your health care provider a list of all the medicines, herbs, non-prescription drugs, or dietary supplements you use. Also tell them if you smoke, drink alcohol, or use illegal drugs. Some items may interact with your medicine. What should I watch for while using this medicine? Your condition will be monitored carefully while you are receiving this medicine. You will need important blood work done while you are taking this medicine. This drug may make you feel generally unwell. This is not uncommon, as chemotherapy can affect healthy cells as well as cancer cells. Report any side effects. Continue your course of treatment even though you feel ill unless your doctor tells you to stop. In some cases, you may be given additional medicines to help with  side effects. Follow all directions for their use. Call your doctor or health care professional for advice if  you get a fever, chills or sore throat, or other symptoms of a cold or flu. Do not treat yourself. This drug decreases your body's ability to fight infections. Try to avoid being around people who are sick. This medicine may increase your risk to bruise or bleed. Call your doctor or health care professional if you notice any unusual bleeding. Be careful brushing and flossing your teeth or using a toothpick because you may get an infection or bleed more easily. If you have any dental work done, tell your dentist you are receiving this medicine. Avoid taking products that contain aspirin, acetaminophen, ibuprofen, naproxen, or ketoprofen unless instructed by your doctor. These medicines may hide a fever. Do not become pregnant while taking this medicine. Women should inform their doctor if they wish to become pregnant or think they might be pregnant. There is a potential for serious side effects to an unborn child. Talk to your health care professional or pharmacist for more information. Do not breast-feed an infant while taking this medicine. Drink fluids as directed while you are taking this medicine. This will help protect your kidneys. Call your doctor or health care professional if you get diarrhea. Do not treat yourself. What side effects may I notice from receiving this medicine? Side effects that you should report to your doctor or health care professional as soon as possible: -allergic reactions like skin rash, itching or hives, swelling of the face, lips, or tongue -signs of infection - fever or chills, cough, sore throat, pain or difficulty passing urine -signs of decreased platelets or bleeding - bruising, pinpoint red spots on the skin, black, tarry stools, nosebleeds -signs of decreased red blood cells - unusually weak or tired, fainting spells, lightheadedness -breathing problems -changes in hearing -gout pain -low blood counts - This drug may decrease the number of white blood cells,  red blood cells and platelets. You may be at increased risk for infections and bleeding. -nausea and vomiting -pain, swelling, redness or irritation at the injection site -pain, tingling, numbness in the hands or feet -problems with balance, movement -trouble passing urine or change in the amount of urine Side effects that usually do not require medical attention (report to your doctor or health care professional if they continue or are bothersome): -changes in vision -loss of appetite -metallic taste in the mouth or changes in taste This list may not describe all possible side effects. Call your doctor for medical advice about side effects. You may report side effects to FDA at 1-800-FDA-1088. Where should I keep my medicine? This drug is given in a hospital or clinic and will not be stored at home. NOTE: This sheet is a summary. It may not cover all possible information. If you have questions about this medicine, talk to your doctor, pharmacist, or health care provider.  2015, Elsevier/Gold Standard. (2007-07-07 14:40:54)

## 2013-10-12 ENCOUNTER — Telehealth: Payer: Self-pay | Admitting: *Deleted

## 2013-10-12 ENCOUNTER — Ambulatory Visit
Admission: RE | Admit: 2013-10-12 | Discharge: 2013-10-12 | Disposition: A | Discharge status: Home / Self Care | Payer: Medicaid Other | Source: Ambulatory Visit | Attending: Radiation Oncology | Admitting: Radiation Oncology

## 2013-10-12 ENCOUNTER — Encounter: Payer: Self-pay | Admitting: Radiation Oncology

## 2013-10-12 VITALS — BP 118/70 | HR 69 | Resp 16 | Wt 152.1 lb

## 2013-10-12 DIAGNOSIS — C539 Malignant neoplasm of cervix uteri, unspecified: Secondary | ICD-10-CM

## 2013-10-12 DIAGNOSIS — Z510 Encounter for antineoplastic radiation therapy: Secondary | ICD-10-CM | POA: Diagnosis not present

## 2013-10-12 NOTE — Telephone Encounter (Signed)
DURING CHEMO FOLLOW UP CALL PT. MENTIONED SHE WAS NOT ON BIRTH CONTROL. PT. WAS WONDERING IF DR.LIVESAY WAS TO CALL SOMETHING IN FOR HER. UNSURE IF PT. HAS A GYN. THIS NOTE TO DR.LIVESAY'S DESK.

## 2013-10-12 NOTE — Telephone Encounter (Signed)
PT. IS EATING AND FORCING FLUIDS. NO NAUSEA OR VOMITING. NO DIARRHEA OR CONSTIPATION. SHE HAD A NORMAL BOWEL MOVEMENT YESTERDAY. PT. AWARE TO USE IMODIUM FOR DIARRHEA AND SENOKOT-S FOR CONSTIPATION. NO MOUTH ISSUES. PT. HAS THE CHEMOTHERAPY MEDICATION SHEET BY HER BED AS A REFERENCE. REMINDED PT. TO CALL THIS OFFICE WITH ANY QUESTIONS OR CONCERNS. THERE IS A PHYSICIAN ON CALL 24/7. SHE VOICES UNDERSTANDING.

## 2013-10-12 NOTE — Progress Notes (Signed)
  Radiation Oncology         [phone] ________________________________  Name: NICHOLE NEYER MRN: [id number]  Date: 10/12/2013  DOB: [birth year]  Weekly Radiation Therapy Management  DIAGNOSIS: Clinical stage IB-2 adenocarcinoma the cervix with PET positive lymph nodes in the pelvis region   Current Dose: 3.6 Gy     Planned Dose:  45 + Gy  Narrative . . . . . . . . The patient presents for routine under treatment assessment.                                   The patient is without complaint. She tolerated chemotherapy well yesterday.                                 Set-up films were reviewed.                                 The chart was checked. Physical Findings. . .  weight is 152 lb 1.6 oz (68.992 kg). Her blood pressure is 118/70 and her pulse is 69. Her respiration is 16. . Weight essentially stable. The lungs are clear. The heart has a regular rhythm and rate. The abdomen is soft and nontender with normal bowel sounds. Impression . . . . . . . The patient is tolerating radiation. Plan . . . . . . . . . . . . Continue treatment as planned.  ________________________________   Blair Promise, PhD, MD

## 2013-10-12 NOTE — Progress Notes (Signed)
Reports spotting this morning but, verbalizes she is nearing her normal menstrual cycle. Reports vaginal discharge but, denies it is worse. Denies diarrhea. Denies dysuria or hematuria. Vitals stable. Denies pain. Reports ativan helps to relieve nausea. Reports she went to the gym today and worked out.

## 2013-10-13 ENCOUNTER — Telehealth: Payer: Self-pay

## 2013-10-13 ENCOUNTER — Ambulatory Visit
Admission: RE | Admit: 2013-10-13 | Discharge: 2013-10-13 | Disposition: A | Discharge status: Home / Self Care | Payer: Medicaid Other | Source: Ambulatory Visit | Attending: Radiation Oncology | Admitting: Radiation Oncology

## 2013-10-13 ENCOUNTER — Telehealth: Payer: Self-pay | Admitting: *Deleted

## 2013-10-13 ENCOUNTER — Other Ambulatory Visit: Payer: Self-pay | Admitting: Oncology

## 2013-10-13 DIAGNOSIS — C539 Malignant neoplasm of cervix uteri, unspecified: Secondary | ICD-10-CM

## 2013-10-13 DIAGNOSIS — Z3049 Encounter for surveillance of other contraceptives: Secondary | ICD-10-CM

## 2013-10-13 DIAGNOSIS — Z510 Encounter for antineoplastic radiation therapy: Secondary | ICD-10-CM | POA: Diagnosis not present

## 2013-10-13 NOTE — Telephone Encounter (Signed)
Jonelle Sports from [facility] called to see what Dr. Ihor Dow recommends for birth control for this patient. She is currently going through chemotherapy. I advised Terri that I will check with Dr. Eugenie Norrie and call her back this afternoon. Terri's number is [phone].

## 2013-10-13 NOTE — Telephone Encounter (Signed)
Per chemo follow up call, contacted patient gyn Dr. Ihor Dow.  Spoke with RN Estill Bamberg who reported they will contact patient to discuss and will follow her for her birth control needs.   Spoke with patient and let her know to expect a call from Dr. Vladimir Creeks office.  Also advised patient she should not get pregnant while taking chemotherapy, and she should use condoms to protect her partner from exposure to the chemotherapy.  Pt voiced understanding.   Routed to Dr. Marko Plume

## 2013-10-13 NOTE — Progress Notes (Signed)
Patient was given the Radiation Therapy and You book and discussed potential side effects/management of diarrhea, fatigue, nausea, skin changes, hair loss and bladder changes.  She was oriented to the clinic and was advised to call with any questions or concerns.

## 2013-10-13 NOTE — Telephone Encounter (Signed)
Dr. Eugenie Norrie requested that I call patient to find out exactly what she is interested in. Called patient and left message to call us back. Called Terri at cancer center back and left message that we will be following up with patient.

## 2013-10-14 ENCOUNTER — Other Ambulatory Visit: Payer: Self-pay | Admitting: Oncology

## 2013-10-14 ENCOUNTER — Encounter: Payer: Self-pay | Admitting: Oncology

## 2013-10-14 ENCOUNTER — Telehealth: Payer: Self-pay | Admitting: Oncology

## 2013-10-14 ENCOUNTER — Telehealth: Payer: Self-pay | Admitting: *Deleted

## 2013-10-14 ENCOUNTER — Other Ambulatory Visit (HOSPITAL_BASED_OUTPATIENT_CLINIC_OR_DEPARTMENT_OTHER): Payer: Medicaid Other

## 2013-10-14 ENCOUNTER — Ambulatory Visit
Admission: RE | Admit: 2013-10-14 | Discharge: 2013-10-14 | Disposition: A | Discharge status: Home / Self Care | Payer: Medicaid Other | Source: Ambulatory Visit | Attending: Radiation Oncology | Admitting: Radiation Oncology

## 2013-10-14 ENCOUNTER — Ambulatory Visit (HOSPITAL_BASED_OUTPATIENT_CLINIC_OR_DEPARTMENT_OTHER): Payer: Medicaid Other | Admitting: Oncology

## 2013-10-14 VITALS — BP 106/69 | HR 76 | Temp 98.3°F | Resp 18 | Ht 65.0 in | Wt 147.1 lb

## 2013-10-14 DIAGNOSIS — Z510 Encounter for antineoplastic radiation therapy: Secondary | ICD-10-CM | POA: Diagnosis not present

## 2013-10-14 DIAGNOSIS — C539 Malignant neoplasm of cervix uteri, unspecified: Secondary | ICD-10-CM

## 2013-10-14 DIAGNOSIS — C530 Malignant neoplasm of endocervix: Secondary | ICD-10-CM

## 2013-10-14 DIAGNOSIS — C775 Secondary and unspecified malignant neoplasm of intrapelvic lymph nodes: Secondary | ICD-10-CM

## 2013-10-14 LAB — COMPREHENSIVE METABOLIC PANEL (CC13)
ALBUMIN: 3.6 g/dL (ref 3.5–5.0)
ALT: 15 U/L (ref 0–55)
AST: 19 U/L (ref 5–34)
Alkaline Phosphatase: 63 U/L (ref 40–150)
Anion Gap: 6 mEq/L (ref 3–11)
BUN: 8.9 mg/dL (ref 7.0–26.0)
CALCIUM: 8.9 mg/dL (ref 8.4–10.4)
CHLORIDE: 105 meq/L (ref 98–109)
CO2: 25 mEq/L (ref 22–29)
Creatinine: 0.8 mg/dL (ref 0.6–1.1)
Glucose: 123 mg/dl (ref 70–140)
Potassium: 4 mEq/L (ref 3.5–5.1)
Sodium: 136 mEq/L (ref 136–145)
Total Bilirubin: 0.87 mg/dL (ref 0.20–1.20)
Total Protein: 6.5 g/dL (ref 6.4–8.3)

## 2013-10-14 LAB — CBC WITH DIFFERENTIAL/PLATELET
BASO%: 0.3 % (ref 0.0–2.0)
Basophils Absolute: 0 10*3/uL (ref 0.0–0.1)
EOS ABS: 0 10*3/uL (ref 0.0–0.5)
EOS%: 0.4 % (ref 0.0–7.0)
HCT: 35.6 % (ref 34.8–46.6)
HGB: 11.8 g/dL (ref 11.6–15.9)
LYMPH%: 12.1 % — ABNORMAL LOW (ref 14.0–49.7)
MCH: 30.8 pg (ref 25.1–34.0)
MCHC: 33.3 g/dL (ref 31.5–36.0)
MCV: 92.4 fL (ref 79.5–101.0)
MONO#: 0.4 10*3/uL (ref 0.1–0.9)
MONO%: 5.6 % (ref 0.0–14.0)
NEUT%: 81.6 % — ABNORMAL HIGH (ref 38.4–76.8)
NEUTROS ABS: 6.4 10*3/uL (ref 1.5–6.5)
Platelets: 335 10*3/uL (ref 145–400)
RBC: 3.85 10*6/uL (ref 3.70–5.45)
RDW: 13.6 % (ref 11.2–14.5)
WBC: 7.8 10*3/uL (ref 3.9–10.3)
lymph#: 0.9 10*3/uL (ref 0.9–3.3)

## 2013-10-14 LAB — MAGNESIUM (CC13): Magnesium: 2.3 mg/dl (ref 1.5–2.5)

## 2013-10-14 NOTE — Telephone Encounter (Signed)
, °

## 2013-10-14 NOTE — Progress Notes (Signed)
OFFICE PROGRESS NOTE   10/14/2013   Physicians:Paola Gehrig; J.Kinard, C.Harraway-Smith   INTERVAL HISTORY:  [name], together with mother, having begun radiation with sensitizing CDDP on 10-11-13. She has done generally well with the treatments this week, other than poor appetite. She did all of oral prehydration for CDDP and has been drinking mostly water. She has used lorazepam and zofran frequently since chemo; we have discussed using these prn rather than regularly for next few days. She had a few sips of tomato soup yesterday, otherwise no food. Last BM was 2 days ago. She has had more vaginal bleeding, which she believes is menstrual period. Left forearm is still slightly puffy and sore from IV infiltration with prehydration fluids at chemo. RUE site of IV restarted for chemo fine.  Dr Vladimir Creeks office was to be in touch with her re birth control, nothing listed on meds now. She asks if she can have alcohol, and I have told her that she should not drink with antiemetics and needs to be eating well before she drinks alcohol.    ONCOLOGIC HISTORY   Cervical cancer   08/30/2013 Initial Diagnosis Cervical cancer  Patient has not had regular medical care in some years. She has had some bleeding with intercourse as well as regular menstrual periods, and recently increased vaginal discharge with fatigue, lethargy, SOB and flu-like symptoms. She was seen at ED on 08-29-13 with finding of large cervical mass, friable on physical exam and confirmed by [place]. The vaginal discharge had few clue cells, negative for trich and yeast. Pregnancy test was negative. She was seen by Dr Ihor Dow, with cervical biopsy ( [id number]) with invasive adenocarcinoma of cervix, no LVSI noted. She saw Dr Alycia Rossetti on 09-07-13, with exam remarkable for large mass filling vagina ~ 8 cm, free of vaginal sidewalls, cervix ~ 4-5 cm, no parametrial involvement, for clinical IB2 adenocarcinoma of cervix. After  discussion options, decision was made to use definitive chemoradiation. PET showed positive pelvic nodes, with 7.3 x 6.9 cm cervical mass and no uptake chest/liver/skeleton or other. She had consultation with Dr Sondra Come on 09-30-13, with simulation on 10-01-13, anticipating 45 Gy in 25 fractions with radiosensitizing chemotherapy, then consideration of IMRT boost to PET positive nodes in pelvis, and 5 high-dose rate intracavitary brachtherapy treatments directed at the cervix.   Review of systems as above, also: No taste changes, no GERD, just aversion to food and to milk products now. No pain. Slept off and on yesterday (possibly from ativan) and slept some last pm (awake 0100 - 0500). No other bleeding. No LE swelling. No SOB.  Remainder of 10 point Review of Systems negative.  Objective:  Vital signs in last 24 hours:  BP 106/69  Pulse 76  Temp(Src) 98.3 F (36.8 C) (Oral)  Resp 18  Ht 5\' 5"  (1.651 m)  Wt 147 lb 1.6 oz (66.724 kg)  BMI 24.48 kg/m2  LMP 09/15/2013 weight is down 5 lbs from 10-12-13 (may have been different scale).   Alert, oriented and appropriate. Ambulatory without difficulty. NAD. No alopecia  HEENT:PERRL, sclerae not icteric. Oral mucosa moist without lesions, posterior pharynx clear.  Neck supple. No JVD.  Lymphatics:no cervical,suraclavicular adenopathy Resp: clear to auscultation bilaterally and normal percussion bilaterally Cardio: regular rate and rhythm. No gallop. GI: soft, nontender including epigastrium, not distended, no mass or organomegaly. Few bowel sounds.  Musculoskeletal/ Extremities: RUE and LE without pitting edema, cords, tenderness. Left forearm slightly puffy with tender area 1.5 x 2.5 cm at  site of IV but no cord, no heat or erythema Neuro: no peripheral neuropathy. Otherwise nonfocal Skin without rash, ecchymosis, petechiae   Lab Results:  Results for orders placed in visit on 10/14/13  CBC WITH DIFFERENTIAL      Result Value Ref Range    WBC 7.8  3.9 - 10.3 10e3/uL   NEUT# 6.4  1.5 - 6.5 10e3/uL   HGB 11.8  11.6 - 15.9 g/dL   HCT 35.6  34.8 - 46.6 %   Platelets 335  145 - 400 10e3/uL   MCV 92.4  79.5 - 101.0 fL   MCH 30.8  25.1 - 34.0 pg   MCHC 33.3  31.5 - 36.0 g/dL   RBC 3.85  3.70 - 5.45 10e6/uL   RDW 13.6  11.2 - 14.5 %   lymph# 0.9  0.9 - 3.3 10e3/uL   MONO# 0.4  0.1 - 0.9 10e3/uL   Eosinophils Absolute 0.0  0.0 - 0.5 10e3/uL   Basophils Absolute 0.0  0.0 - 0.1 10e3/uL   NEUT% 81.6 (*) 38.4 - 76.8 %   LYMPH% 12.1 (*) 14.0 - 49.7 %   MONO% 5.6  0.0 - 14.0 %   EOS% 0.4  0.0 - 7.0 %   BASO% 0.3  0.0 - 2.0 %  COMPREHENSIVE METABOLIC PANEL (KG81)      Result Value Ref Range   Sodium 136  136 - 145 mEq/L   Potassium 4.0  3.5 - 5.1 mEq/L   Chloride 105  98 - 109 mEq/L   CO2 25  22 - 29 mEq/L   Glucose 123  70 - 140 mg/dl   BUN 8.9  7.0 - 26.0 mg/dL   Creatinine 0.8  0.6 - 1.1 mg/dL   Total Bilirubin 0.87  0.20 - 1.20 mg/dL   Alkaline Phosphatase 63  40 - 150 U/L   AST 19  5 - 34 U/L   ALT 15  0 - 55 U/L   Total Protein 6.5  6.4 - 8.3 g/dL   Albumin 3.6  3.5 - 5.0 g/dL   Calcium 8.9  8.4 - 10.4 mg/dL   Anion Gap 6  3 - 11 mEq/L  MAGNESIUM (CC13)      Result Value Ref Range   Magnesium 2.3  1.5 - 2.5 mg/dl     Studies/Results:  No results found.  Medications: I have reviewed the patient's current medications. Instructed to take Senokot S 1 tablet this afternoon and repeat 1 tonight, then use 1-2 once or twice daily if no BM in 24 hrs prior. Do not use antiemetics if no nausea.   DISCUSSION: Warm soaks to left forearm today. Hydration prior to chemo 7-6 as previously. Discussed po intake at length and I have asked [place] dietician to give samples of Boost Breeze or Resource if available, as well as meeting with her.   Assessment/Plan:  1.clinical IB2 adenocarcinoma of cervix, with PET + pelvic nodes (IIIB) in otherwise healthy 38 yo lady: weekly sensitizing CDDP with radiation began 10-11-13, cycle 2  CDDP due 10-18-13. I will see her 10-22-13, and they know to call prior to scheduled appointments if questions or concerns. 2.IVF infiltration LUE on 10-11-13: plan as above. No problems with IV moved to RUE then. 3.recently discontinued tobacco: patient reports that she is doing well with this and does not need patches etc. Encouraged to continue. 4.poor po intake: plan as above  Patient and mother had questions answered to their satisfaction and are in agreement with  plan. Chemo orders confirmed by labs today.    Michille Mcelrath P, MD   10/14/2013, 2:04 PM

## 2013-10-14 NOTE — Patient Instructions (Signed)
Try Boost Breeze or Resource, which are liquid juice supplements, like Ensure/ Boost except lighter.  Take Senokot S 1 tablet when you get home and repeat 1 tablet at bedtime if needed for bowels tonight, then 1-2 tablets once or twice daily

## 2013-10-14 NOTE — Telephone Encounter (Signed)
Per staff message and POF I have scheduled appts. Advised scheduler of appts. JMW  

## 2013-10-18 ENCOUNTER — Ambulatory Visit: Payer: Medicaid Other | Admitting: Nutrition

## 2013-10-18 ENCOUNTER — Ambulatory Visit
Admission: RE | Admit: 2013-10-18 | Discharge: 2013-10-18 | Disposition: A | Discharge status: Home / Self Care | Payer: Medicaid Other | Source: Ambulatory Visit | Attending: Radiation Oncology | Admitting: Radiation Oncology

## 2013-10-18 ENCOUNTER — Other Ambulatory Visit (HOSPITAL_BASED_OUTPATIENT_CLINIC_OR_DEPARTMENT_OTHER): Payer: Medicaid Other

## 2013-10-18 ENCOUNTER — Ambulatory Visit (HOSPITAL_BASED_OUTPATIENT_CLINIC_OR_DEPARTMENT_OTHER): Payer: Medicaid Other

## 2013-10-18 VITALS — BP 115/63 | HR 83 | Temp 97.4°F | Resp 19

## 2013-10-18 DIAGNOSIS — C539 Malignant neoplasm of cervix uteri, unspecified: Secondary | ICD-10-CM

## 2013-10-18 DIAGNOSIS — C530 Malignant neoplasm of endocervix: Secondary | ICD-10-CM

## 2013-10-18 DIAGNOSIS — C775 Secondary and unspecified malignant neoplasm of intrapelvic lymph nodes: Secondary | ICD-10-CM

## 2013-10-18 DIAGNOSIS — Z5111 Encounter for antineoplastic chemotherapy: Secondary | ICD-10-CM

## 2013-10-18 DIAGNOSIS — Z510 Encounter for antineoplastic radiation therapy: Secondary | ICD-10-CM | POA: Diagnosis not present

## 2013-10-18 LAB — CBC WITH DIFFERENTIAL/PLATELET
BASO%: 0.2 % (ref 0.0–2.0)
BASOS ABS: 0 10*3/uL (ref 0.0–0.1)
EOS%: 1.2 % (ref 0.0–7.0)
Eosinophils Absolute: 0.1 10*3/uL (ref 0.0–0.5)
HCT: 38.5 % (ref 34.8–46.6)
HGB: 12.9 g/dL (ref 11.6–15.9)
LYMPH%: 9.3 % — ABNORMAL LOW (ref 14.0–49.7)
MCH: 30.5 pg (ref 25.1–34.0)
MCHC: 33.5 g/dL (ref 31.5–36.0)
MCV: 91 fL (ref 79.5–101.0)
MONO#: 0.5 10*3/uL (ref 0.1–0.9)
MONO%: 6.1 % (ref 0.0–14.0)
NEUT#: 7.3 10*3/uL — ABNORMAL HIGH (ref 1.5–6.5)
NEUT%: 83.2 % — ABNORMAL HIGH (ref 38.4–76.8)
Platelets: 288 10*3/uL (ref 145–400)
RBC: 4.23 10*6/uL (ref 3.70–5.45)
RDW: 12.7 % (ref 11.2–14.5)
WBC: 8.8 10*3/uL (ref 3.9–10.3)
lymph#: 0.8 10*3/uL — ABNORMAL LOW (ref 0.9–3.3)

## 2013-10-18 LAB — COMPREHENSIVE METABOLIC PANEL (CC13)
ALBUMIN: 4 g/dL (ref 3.5–5.0)
ALK PHOS: 85 U/L (ref 40–150)
ALT: 17 U/L (ref 0–55)
AST: 17 U/L (ref 5–34)
Anion Gap: 9 mEq/L (ref 3–11)
BUN: 10.1 mg/dL (ref 7.0–26.0)
CO2: 24 mEq/L (ref 22–29)
Calcium: 9.6 mg/dL (ref 8.4–10.4)
Chloride: 105 mEq/L (ref 98–109)
Creatinine: 0.8 mg/dL (ref 0.6–1.1)
Glucose: 93 mg/dl (ref 70–140)
Potassium: 3.8 mEq/L (ref 3.5–5.1)
Sodium: 138 mEq/L (ref 136–145)
Total Bilirubin: 0.45 mg/dL (ref 0.20–1.20)
Total Protein: 7.5 g/dL (ref 6.4–8.3)

## 2013-10-18 LAB — MAGNESIUM (CC13): Magnesium: 2.1 mg/dl (ref 1.5–2.5)

## 2013-10-18 MED ORDER — HEPARIN SOD (PORK) LOCK FLUSH 100 UNIT/ML IV SOLN
500.0000 [IU] | Freq: Once | INTRAVENOUS | Status: DC | PRN
  Filled 2013-10-18: qty 5

## 2013-10-18 MED ORDER — NORGESTIM-ETH ESTRAD TRIPHASIC 0.18/0.215/0.25 MG-25 MCG PO TABS: 1.0000 | ORAL_TABLET | Freq: Every day | ORAL | Status: DC

## 2013-10-18 MED ORDER — SODIUM CHLORIDE 0.9 % IV SOLN
150.0000 mg | Freq: Once | INTRAVENOUS | Status: AC
  Administered 2013-10-18: 150 mg via INTRAVENOUS
  Filled 2013-10-18: qty 5

## 2013-10-18 MED ORDER — ONDANSETRON 16 MG/50ML IVPB (CHCC)
16.0000 mg | Freq: Once | INTRAVENOUS | Status: AC
  Administered 2013-10-18: 16 mg via INTRAVENOUS

## 2013-10-18 MED ORDER — LORAZEPAM 1 MG PO TABS: 0.5000 mg | ORAL_TABLET | Freq: Once | ORAL | Status: DC | PRN

## 2013-10-18 MED ORDER — DEXAMETHASONE SODIUM PHOSPHATE 20 MG/5ML IJ SOLN
INTRAMUSCULAR | Status: AC
  Filled 2013-10-18: qty 5

## 2013-10-18 MED ORDER — SODIUM CHLORIDE 0.9 % IJ SOLN
10.0000 mL | INTRAMUSCULAR | Status: DC | PRN
  Filled 2013-10-18: qty 10

## 2013-10-18 MED ORDER — POTASSIUM CHLORIDE 2 MEQ/ML IV SOLN
Freq: Once | INTRAVENOUS | Status: AC
  Administered 2013-10-18: 10:00:00 via INTRAVENOUS
  Filled 2013-10-18: qty 10

## 2013-10-18 MED ORDER — SODIUM CHLORIDE 0.9 % IV SOLN
Freq: Once | INTRAVENOUS | Status: AC
  Administered 2013-10-18: 10:00:00 via INTRAVENOUS

## 2013-10-18 MED ORDER — CISPLATIN CHEMO INJECTION 100MG/100ML
40.0000 mg/m2 | Freq: Once | INTRAVENOUS | Status: AC
  Administered 2013-10-18: 70 mg via INTRAVENOUS
  Filled 2013-10-18: qty 70

## 2013-10-18 MED ORDER — DEXAMETHASONE SODIUM PHOSPHATE 20 MG/5ML IJ SOLN
12.0000 mg | Freq: Once | INTRAMUSCULAR | Status: AC
  Administered 2013-10-18: 12 mg via INTRAVENOUS

## 2013-10-18 MED ORDER — ONDANSETRON 16 MG/50ML IVPB (CHCC)
INTRAVENOUS | Status: AC
  Filled 2013-10-18: qty 16

## 2013-10-18 NOTE — Telephone Encounter (Signed)
Called patient. Karen Daniels states that Karen Daniels would like to be on a birth control pill. Dr. Marko Plume prefers that Karen Daniels get on something sooner than later. I informed patient that I will speak with Dr. Eugenie Norrie and send in med to her pharmacy. Patient agreeable.

## 2013-10-18 NOTE — Progress Notes (Signed)
1020- Patient taken to radiation with hydration fluids. Has voided 450 total. Will start premeds when patient returns to unit.

## 2013-10-18 NOTE — Addendum Note (Signed)
Addended by: Christiana Pellant A on: 10/18/2013 04:24 PM   Modules accepted: Orders

## 2013-10-18 NOTE — Progress Notes (Signed)
Patient reports swelling "like a swollen blood vessel" to left side of scalp. Area checked, slight redness, red bumps along the scalp. Patient reports "I noticed it my 1st chemo day last Monday". States swelling and tenderness has decreased significantly. Patient encouraged to check scalp daily and report worsening symptoms as soon as possible, voices understanding.

## 2013-10-18 NOTE — Progress Notes (Signed)
38 year old female diagnosed with cervical cancer.  She is a patient of Dr. Marko Plume.  Past medical history includes anxiety, depression, herpes simplex.  Medications include Ativan and Zofran.  Labs include albumin 3.6.  Height: 65 inches. Weight: 147.1 pounds July 2. Usual body weight: 143-150 pounds. BMI: 24.48.  Patient reports poor appetite with taste alterations limiting oral intake.  She cannot stand the thought of eating anything dairy.  She is consuming other protein foods.  Denies issues with her bowels.  Weight is within Usual body weight range.  Nutrition diagnosis: Food and nutrition related knowledge deficit related to new diagnosis of cervical cancer and associated treatments as evidenced by no prior need for nutrition related information.  Intervention: Patient was educated on strategies for improving appetite.  She was encouraged to consume small, frequent meals throughout the day.  Recommended patient try clear liquid oral nutrition supplements such as ensure clear or boost breeze.  Also educated patient on strategies for improving taste alterations.  Provided fact sheets and samples of oral nutrition supplements.  Patient's questions answered.  Teach back method used.  Contact information was provided.  Monitoring, evaluation, goals: Patient will tolerate adequate calories and protein to promote weight maintenance throughout treatment.  She will tolerate clear liquid oral supplements.  Next visit: Monday, July 20, during chemotherapy.    **Disclaimer: This note was dictated with voice recognition software. Similar sounding words can inadvertently be transcribed and this note may contain transcription errors which may not have been corrected upon publication of note.**

## 2013-10-18 NOTE — Patient Instructions (Signed)
[facility] Discharge Instructions for Patients Receiving Chemotherapy  Today you received the following chemotherapy agents: Cisplatin  To help prevent nausea and vomiting after your treatment, we encourage you to take your nausea medication as prescribed by your physician.   If you develop nausea and vomiting that is not controlled by your nausea medication, call the clinic.   BELOW ARE SYMPTOMS THAT SHOULD BE REPORTED IMMEDIATELY:  *FEVER GREATER THAN 100.5 F  *CHILLS WITH OR WITHOUT FEVER  NAUSEA AND VOMITING THAT IS NOT CONTROLLED WITH YOUR NAUSEA MEDICATION  *UNUSUAL SHORTNESS OF BREATH  *UNUSUAL BRUISING OR BLEEDING  TENDERNESS IN MOUTH AND THROAT WITH OR WITHOUT PRESENCE OF ULCERS  *URINARY PROBLEMS  *BOWEL PROBLEMS  UNUSUAL RASH Items with * indicate a potential emergency and should be followed up as soon as possible.  Feel free to call the clinic you have any questions or concerns. The clinic phone number is [phone].

## 2013-10-18 NOTE — Telephone Encounter (Signed)
Rx sent to pharmacy. Patient aware to pick up.

## 2013-10-19 ENCOUNTER — Ambulatory Visit
Admission: RE | Admit: 2013-10-19 | Discharge: 2013-10-19 | Disposition: A | Discharge status: Home / Self Care | Payer: Medicaid Other | Source: Ambulatory Visit | Attending: Radiation Oncology | Admitting: Radiation Oncology

## 2013-10-19 ENCOUNTER — Encounter: Payer: Self-pay | Admitting: Radiation Oncology

## 2013-10-19 ENCOUNTER — Other Ambulatory Visit: Payer: Self-pay | Admitting: Oncology

## 2013-10-19 VITALS — BP 118/68 | HR 90 | Resp 18 | Wt 149.4 lb

## 2013-10-19 DIAGNOSIS — Z510 Encounter for antineoplastic radiation therapy: Secondary | ICD-10-CM | POA: Diagnosis not present

## 2013-10-19 DIAGNOSIS — C539 Malignant neoplasm of cervix uteri, unspecified: Secondary | ICD-10-CM

## 2013-10-19 NOTE — Progress Notes (Signed)
  Radiation Oncology         [phone] ________________________________  Name: Karen Daniels MRN: [id number]  Date: 10/19/2013  DOB: 11/10/1975  Weekly Radiation Therapy Management  Cervical cancer   Primary site: Cervix Uteri   Staging method: AJCC 7th Edition   Clinical: Stage IB2 (T1b2, N0, M0)    Summary: Stage IB2 (T1b2, N0, M0)   Current Dose: 10.8 Gy     Planned Dose:  45 + Gy  Narrative . . . . . . . . The patient presents for routine under treatment assessment.                                   The patient has had some nausea which she relates to chemotherapy. She did have some diarrhea which is resolved at this time. She also noticed discomfort in the perineum and perirectal area. Advise consideration for a sitz bath. She also pain Tucks medicated pads.  patient will start on the birth control pills on recommendation from medical oncology. She is accompanied by her mother and son on evaluation today                                 Set-up films were reviewed.                                 The chart was checked. Physical Findings. . .  weight is 149 lb 6.4 oz (67.767 kg). Her blood pressure is 118/68 and her pulse is 90. Her respiration is 18. . The lungs are clear. The heart has a regular rhythm and rate. The abdomen is soft and nontender with normal bowel sounds. Impression . . . . . . . The patient is tolerating radiation. Plan . . . . . . . . . . . . Continue treatment as planned.  ________________________________   Blair Promise, PhD, MD

## 2013-10-19 NOTE — Progress Notes (Addendum)
Vitals and weight stable. Reports she had chemotherapy yesterday. Reports nausea is controlled wit zofran and ativan. Reports diarrhea has resolved. Reports irritation when she voids. Reports using baby wipes instead of toilet paper. Reports the amount of vaginal discharge has greatly decreased. Reports she plans to begin birth control Sunday coming. Reports she stopped her menstrual cycle this morning. Reports fatigue. Denies pain.

## 2013-10-20 ENCOUNTER — Ambulatory Visit
Admission: RE | Admit: 2013-10-20 | Discharge: 2013-10-20 | Disposition: A | Discharge status: Home / Self Care | Payer: Medicaid Other | Source: Ambulatory Visit | Attending: Radiation Oncology | Admitting: Radiation Oncology

## 2013-10-20 DIAGNOSIS — Z510 Encounter for antineoplastic radiation therapy: Secondary | ICD-10-CM | POA: Diagnosis not present

## 2013-10-21 ENCOUNTER — Ambulatory Visit
Admission: RE | Admit: 2013-10-21 | Discharge: 2013-10-21 | Disposition: A | Discharge status: Home / Self Care | Payer: Medicaid Other | Source: Ambulatory Visit | Attending: Radiation Oncology | Admitting: Radiation Oncology

## 2013-10-21 DIAGNOSIS — Z510 Encounter for antineoplastic radiation therapy: Secondary | ICD-10-CM | POA: Diagnosis not present

## 2013-10-22 ENCOUNTER — Ambulatory Visit (HOSPITAL_BASED_OUTPATIENT_CLINIC_OR_DEPARTMENT_OTHER): Payer: Medicaid Other | Admitting: Oncology

## 2013-10-22 ENCOUNTER — Other Ambulatory Visit: Payer: Self-pay | Admitting: *Deleted

## 2013-10-22 ENCOUNTER — Encounter: Payer: Self-pay | Admitting: Oncology

## 2013-10-22 ENCOUNTER — Ambulatory Visit: Payer: Medicaid Other | Admitting: Oncology

## 2013-10-22 ENCOUNTER — Encounter: Payer: Self-pay | Admitting: *Deleted

## 2013-10-22 ENCOUNTER — Ambulatory Visit
Admission: RE | Admit: 2013-10-22 | Discharge: 2013-10-22 | Disposition: A | Discharge status: Home / Self Care | Payer: Medicaid Other | Source: Ambulatory Visit | Attending: Radiation Oncology | Admitting: Radiation Oncology

## 2013-10-22 ENCOUNTER — Other Ambulatory Visit: Payer: Medicaid Other

## 2013-10-22 VITALS — BP 119/75 | HR 77 | Temp 98.4°F | Resp 18 | Ht 65.0 in | Wt 149.2 lb

## 2013-10-22 DIAGNOSIS — C775 Secondary and unspecified malignant neoplasm of intrapelvic lymph nodes: Secondary | ICD-10-CM

## 2013-10-22 DIAGNOSIS — Z510 Encounter for antineoplastic radiation therapy: Secondary | ICD-10-CM | POA: Diagnosis not present

## 2013-10-22 DIAGNOSIS — C539 Malignant neoplasm of cervix uteri, unspecified: Secondary | ICD-10-CM

## 2013-10-22 MED ORDER — LORAZEPAM 0.5 MG PO TABS: ORAL_TABLET | ORAL | Status: DC

## 2013-10-22 NOTE — Progress Notes (Signed)
OFFICE PROGRESS NOTE   10/22/2013   Physicians:Paola Gehrig; J.Kinard, C.Harraway-Smith   INTERVAL HISTORY:  [name], together with mother, in continuing attention to sensitizing CDDP chemotherapy being given with radiation for clinical IB2 adenocarcinoma of cervix with PET positive pelvic nodes. She had second CDDP on 10-18-13, with difficult peripheral IV access tho otherwise she has felt better this week. She has taken zofran q Am and ativan qhs, without nausea and finally able to sleep. I have suggested trying the zofran just prn for next few days, as she may not need this as much over weekend; she is aware that ativan long term can be difficult to stop, but for now this seems beneficial. Vaginal bleeding stopped this week. She has no taste change, had [place] for lunch today. She understands that Dr Sondra Come will reevaluate for HDR ~4 weeks after pelvic radiation completes.  She does not have central catheter.   ONCOLOGIC HISTORY   Cervical cancer   08/30/2013 Initial Diagnosis Cervical cancer  Patient has not had regular medical care in some years. She has had some bleeding with intercourse as well as regular menstrual periods, and recently increased vaginal discharge with fatigue, lethargy, SOB and flu-like symptoms. She was seen at ED on 08-29-13 with finding of large cervical mass, friable on physical exam and confirmed by [place]. The vaginal discharge had few clue cells, negative for trich and yeast. Pregnancy test was negative. She was seen by Dr Ihor Dow, with cervical biopsy ( [id number]) with invasive adenocarcinoma of cervix, no LVSI noted. She saw Dr Alycia Rossetti on 09-07-13, with exam remarkable for large mass filling vagina ~ 8 cm, free of vaginal sidewalls, cervix ~ 4-5 cm, no parametrial involvement, for clinical IB2 adenocarcinoma of cervix. After discussion options, decision was made to use definitive chemoradiation. PET showed positive pelvic nodes, with 7.3 x 6.9 cm cervical  mass and no uptake chest/liver/skeleton or other. She had consultation with Dr Sondra Come on 09-30-13, with simulation on 10-01-13, anticipating 45 Gy in 25 fractions with radiosensitizing chemotherapy, then consideration of IMRT boost to PET positive nodes in pelvis, and 5 high-dose rate intracavitary brachtherapy treatments. First weekly CDDP was 10-13-13.   Review of systems as above, also: Bowels moving ~ every other day and she will increase laxatives. No other bleeding. No hair loss. No problems with oral prehydration for CDDP. Energy better since she has been sleeping. Still slight tingling left forearm where problems with IV infiltration cycle 1, but is improving. Remainder of 10 point Review of Systems negative.  Objective:  Vital signs in last 24 hours:  BP 119/75  Pulse 77  Temp(Src) 98.4 F (36.9 C) (Oral)  Resp 18  Ht 5\' 5"  (1.651 m)  Wt 149 lb 3.2 oz (67.677 kg)  BMI 24.83 kg/m2  LMP 09/15/2013 weight is stable.   Alert, oriented and appropriate. Ambulatory without assistance difficulty.  No alopecia  HEENT:PERRL, sclerae not icteric. Oral mucosa moist without lesions, posterior pharynx clear.  Neck supple. No JVD.  Lymphatics:no cervical,suraclavicular or inguinal adenopathy Resp: clear to auscultation bilaterally and normal percussion bilaterally Cardio: regular rate and rhythm. No gallop. GI: soft, nontender, not distended, no mass or organomegaly. Normally active bowel sounds.  Musculoskeletal/ Extremities: without pitting edema, cords, tenderness Neuro: no peripheral neuropathy. Otherwise nonfocal. PSYCH normal mood and affect Skin without rash, ecchymosis, petechiae   Lab Results:  Results for orders placed in visit on 10/18/13  CBC WITH DIFFERENTIAL      Result Value Ref Range  WBC 8.8  3.9 - 10.3 10e3/uL   NEUT# 7.3 (*) 1.5 - 6.5 10e3/uL   HGB 12.9  11.6 - 15.9 g/dL   HCT 38.5  34.8 - 46.6 %   Platelets 288  145 - 400 10e3/uL   MCV 91.0  79.5 - 101.0 fL    MCH 30.5  25.1 - 34.0 pg   MCHC 33.5  31.5 - 36.0 g/dL   RBC 4.23  3.70 - 5.45 10e6/uL   RDW 12.7  11.2 - 14.5 %   lymph# 0.8 (*) 0.9 - 3.3 10e3/uL   MONO# 0.5  0.1 - 0.9 10e3/uL   Eosinophils Absolute 0.1  0.0 - 0.5 10e3/uL   Basophils Absolute 0.0  0.0 - 0.1 10e3/uL   NEUT% 83.2 (*) 38.4 - 76.8 %   LYMPH% 9.3 (*) 14.0 - 49.7 %   MONO% 6.1  0.0 - 14.0 %   EOS% 1.2  0.0 - 7.0 %   BASO% 0.2  0.0 - 2.0 %  COMPREHENSIVE METABOLIC PANEL (NU27)      Result Value Ref Range   Sodium 138  136 - 145 mEq/L   Potassium 3.8  3.5 - 5.1 mEq/L   Chloride 105  98 - 109 mEq/L   CO2 24  22 - 29 mEq/L   Glucose 93  70 - 140 mg/dl   BUN 10.1  7.0 - 26.0 mg/dL   Creatinine 0.8  0.6 - 1.1 mg/dL   Total Bilirubin 0.45  0.20 - 1.20 mg/dL   Alkaline Phosphatase 85  40 - 150 U/L   AST 17  5 - 34 U/L   ALT 17  0 - 55 U/L   Total Protein 7.5  6.4 - 8.3 g/dL   Albumin 4.0  3.5 - 5.0 g/dL   Calcium 9.6  8.4 - 10.4 mg/dL   Anion Gap 9  3 - 11 mEq/L  MAGNESIUM (CC13)      Result Value Ref Range   Magnesium 2.1  1.5 - 2.5 mg/dl     Studies/Results:  No results found.  Medications: I have reviewed the patient's current medications. She will begin OCP per Dr Vladimir Creeks office.  DISCUSSION: will continue weekly CDDP thru last full week of pelvic radiation, 11-08-13. She may need PICC for last couple of treatments, which would be best placed AM of chemo if so and would be DCd when chemo completed. She wants to try peripheral access again at least 10-25-13.   Assessment/Plan:  1.clinical IB2 adenocarcinoma of cervix, with PET + pelvic nodes (IIIB) in otherwise healthy 38 yo lady: weekly sensitizing CDDP with radiation began 10-11-13, cycle 3 CDDP due 10-25-13.  2.Difficult peripheral IV access and may need PICC for last couple of treatments, which would need flushes and dressing changes at this office if so.  3.recently discontinued tobacco: Encouraged to continue.  4.poor po intake: improved. [place]  dietician to follow up   Chemo orders completed. I will see her back coordinating with RT week of 8-3. Patient and mother in agreement with plan. Message sent to gyn oncology to confirm above chemo plans, in case they prefer another weekly CDDP with IMRT boost to pelvic nodes now scheduled for week after pelvic RT completes.   Sebrena Engh P, MD   10/22/2013, 4:27 PM

## 2013-10-25 ENCOUNTER — Ambulatory Visit (HOSPITAL_BASED_OUTPATIENT_CLINIC_OR_DEPARTMENT_OTHER): Payer: Medicaid Other

## 2013-10-25 ENCOUNTER — Other Ambulatory Visit (HOSPITAL_BASED_OUTPATIENT_CLINIC_OR_DEPARTMENT_OTHER): Payer: Medicaid Other

## 2013-10-25 ENCOUNTER — Ambulatory Visit
Admission: RE | Admit: 2013-10-25 | Discharge: 2013-10-25 | Disposition: A | Discharge status: Home / Self Care | Payer: Medicaid Other | Source: Ambulatory Visit | Attending: Radiation Oncology | Admitting: Radiation Oncology

## 2013-10-25 DIAGNOSIS — Z5111 Encounter for antineoplastic chemotherapy: Secondary | ICD-10-CM

## 2013-10-25 DIAGNOSIS — C539 Malignant neoplasm of cervix uteri, unspecified: Secondary | ICD-10-CM

## 2013-10-25 DIAGNOSIS — C770 Secondary and unspecified malignant neoplasm of lymph nodes of head, face and neck: Secondary | ICD-10-CM

## 2013-10-25 DIAGNOSIS — Z510 Encounter for antineoplastic radiation therapy: Secondary | ICD-10-CM | POA: Diagnosis not present

## 2013-10-25 LAB — CBC WITH DIFFERENTIAL/PLATELET
BASO%: 0.4 % (ref 0.0–2.0)
Basophils Absolute: 0 10*3/uL (ref 0.0–0.1)
EOS%: 1.2 % (ref 0.0–7.0)
Eosinophils Absolute: 0.1 10*3/uL (ref 0.0–0.5)
HEMATOCRIT: 32.9 % — AB (ref 34.8–46.6)
HEMOGLOBIN: 11.1 g/dL — AB (ref 11.6–15.9)
LYMPH%: 12.4 % — AB (ref 14.0–49.7)
MCH: 31.1 pg (ref 25.1–34.0)
MCHC: 33.7 g/dL (ref 31.5–36.0)
MCV: 92.4 fL (ref 79.5–101.0)
MONO#: 0.4 10*3/uL (ref 0.1–0.9)
MONO%: 7.2 % (ref 0.0–14.0)
NEUT#: 4.9 10*3/uL (ref 1.5–6.5)
NEUT%: 78.8 % — AB (ref 38.4–76.8)
Platelets: 220 10*3/uL (ref 145–400)
RBC: 3.56 10*6/uL — ABNORMAL LOW (ref 3.70–5.45)
RDW: 13.5 % (ref 11.2–14.5)
WBC: 6.3 10*3/uL (ref 3.9–10.3)
lymph#: 0.8 10*3/uL — ABNORMAL LOW (ref 0.9–3.3)

## 2013-10-25 LAB — COMPREHENSIVE METABOLIC PANEL (CC13)
ALT: 11 U/L (ref 0–55)
ANION GAP: 9 meq/L (ref 3–11)
AST: 12 U/L (ref 5–34)
Albumin: 3.5 g/dL (ref 3.5–5.0)
Alkaline Phosphatase: 89 U/L (ref 40–150)
BUN: 10.2 mg/dL (ref 7.0–26.0)
CALCIUM: 9 mg/dL (ref 8.4–10.4)
CHLORIDE: 107 meq/L (ref 98–109)
CO2: 22 meq/L (ref 22–29)
CREATININE: 0.7 mg/dL (ref 0.6–1.1)
Glucose: 101 mg/dl (ref 70–140)
Potassium: 3.8 mEq/L (ref 3.5–5.1)
Sodium: 137 mEq/L (ref 136–145)
Total Bilirubin: <0.2 mg/dL (ref 0.20–1.20)
Total Protein: 6.7 g/dL (ref 6.4–8.3)

## 2013-10-25 LAB — MAGNESIUM (CC13): Magnesium: 1.9 mg/dl (ref 1.5–2.5)

## 2013-10-25 MED ORDER — DEXAMETHASONE SODIUM PHOSPHATE 20 MG/5ML IJ SOLN
INTRAMUSCULAR | Status: AC
  Filled 2013-10-25: qty 5

## 2013-10-25 MED ORDER — SODIUM CHLORIDE 0.9 % IV SOLN
40.0000 mg/m2 | Freq: Once | INTRAVENOUS | Status: AC
  Administered 2013-10-25: 70 mg via INTRAVENOUS
  Filled 2013-10-25: qty 70

## 2013-10-25 MED ORDER — LORAZEPAM 1 MG PO TABS: 0.5000 mg | ORAL_TABLET | Freq: Once | ORAL | Status: DC | PRN

## 2013-10-25 MED ORDER — SODIUM CHLORIDE 0.9 % IV SOLN
150.0000 mg | Freq: Once | INTRAVENOUS | Status: AC
  Administered 2013-10-25: 150 mg via INTRAVENOUS
  Filled 2013-10-25: qty 5

## 2013-10-25 MED ORDER — ONDANSETRON 16 MG/50ML IVPB (CHCC)
INTRAVENOUS | Status: AC
  Filled 2013-10-25: qty 16

## 2013-10-25 MED ORDER — ONDANSETRON 16 MG/50ML IVPB (CHCC)
16.0000 mg | Freq: Once | INTRAVENOUS | Status: AC
  Administered 2013-10-25: 16 mg via INTRAVENOUS

## 2013-10-25 MED ORDER — SODIUM CHLORIDE 0.9 % IV SOLN
Freq: Once | INTRAVENOUS | Status: AC
  Administered 2013-10-25: 10:00:00 via INTRAVENOUS

## 2013-10-25 MED ORDER — POTASSIUM CHLORIDE 2 MEQ/ML IV SOLN
Freq: Once | INTRAVENOUS | Status: AC
  Administered 2013-10-25: 11:00:00 via INTRAVENOUS
  Filled 2013-10-25: qty 10

## 2013-10-25 MED ORDER — DEXAMETHASONE SODIUM PHOSPHATE 20 MG/5ML IJ SOLN
12.0000 mg | Freq: Once | INTRAMUSCULAR | Status: AC
  Administered 2013-10-25: 12 mg via INTRAVENOUS

## 2013-10-25 NOTE — Progress Notes (Signed)
1015 pt voided 400 cc urine 1253 voided total of 700 more cc before cisplatin. 1403 brisk blood return noted in iv.

## 2013-10-25 NOTE — Patient Instructions (Signed)
[facility] Discharge Instructions for Patients Receiving Chemotherapy  Today you received the following chemotherapy agents cisplatin  To help prevent nausea and vomiting after your treatment, we encourage you to take your nausea medication as needed   If you develop nausea and vomiting that is not controlled by your nausea medication, call the clinic.   BELOW ARE SYMPTOMS THAT SHOULD BE REPORTED IMMEDIATELY:  *FEVER GREATER THAN 100.5 F  *CHILLS WITH OR WITHOUT FEVER  NAUSEA AND VOMITING THAT IS NOT CONTROLLED WITH YOUR NAUSEA MEDICATION  *UNUSUAL SHORTNESS OF BREATH  *UNUSUAL BRUISING OR BLEEDING  TENDERNESS IN MOUTH AND THROAT WITH OR WITHOUT PRESENCE OF ULCERS  *URINARY PROBLEMS  *BOWEL PROBLEMS  UNUSUAL RASH Items with * indicate a potential emergency and should be followed up as soon as possible.  Feel free to call the clinic you have any questions or concerns. The clinic phone number is [phone].    

## 2013-10-25 NOTE — Progress Notes (Signed)
Pt voided 1100 total after start of cisplatin

## 2013-10-26 ENCOUNTER — Ambulatory Visit
Admission: RE | Admit: 2013-10-26 | Discharge: 2013-10-26 | Disposition: A | Discharge status: Home / Self Care | Payer: Medicaid Other | Source: Ambulatory Visit | Attending: Radiation Oncology | Admitting: Radiation Oncology

## 2013-10-26 VITALS — BP 113/66 | HR 84 | Temp 98.7°F | Ht 65.0 in | Wt 153.4 lb

## 2013-10-26 DIAGNOSIS — Z510 Encounter for antineoplastic radiation therapy: Secondary | ICD-10-CM | POA: Diagnosis not present

## 2013-10-26 DIAGNOSIS — C539 Malignant neoplasm of cervix uteri, unspecified: Secondary | ICD-10-CM

## 2013-10-26 NOTE — Progress Notes (Signed)
  Radiation Oncology         [phone] ________________________________  Name: Karen Daniels MRN: [id number]  Date: 10/26/2013  DOB: [birth year]  Weekly Radiation Therapy Management  Cervical cancer   Primary site: Cervix Uteri   Staging method: AJCC 7th Edition   Clinical: Stage IB2 (T1b2, N0, M0)    Summary: Stage IB2 (T1b2, N0, M0)   Current Dose: 19.8 Gy     Planned Dose:  45 + Gy  Narrative . . . . . . . . The patient presents for routine under treatment assessment.                                   The patient is without complaint except for irritation in the perineum area. This is bothersome when vaginal drainage or urine contacts this area.                                 Set-up films were reviewed.                                 The chart was checked. Physical Findings. . .  height is 5\' 5"  (1.651 m) and weight is 153 lb 6.4 oz (69.582 kg). Her oral temperature is 98.7 F (37.1 C). Her blood pressure is 113/66 and her pulse is 84. . Patient has some mild erythema along the perineum region. Impression . . . . . . . The patient is tolerating radiation. Plan . . . . . . . . . . . . Continue treatment as planned. She was advised to try Desitin for her skin irritation. She'll continue with sitz bath. Patient's first brachii therapy procedure is scheduled for July 28 at 7:30 am.   ________________________________   Blair Promise, PhD, MD

## 2013-10-26 NOTE — Progress Notes (Signed)
Karen Daniels has completed 11 fractions to her pelvis.  She reports having a rash/irritation on her rectal area that hurts with urination.  Slight redness noted in her rectal area with a scattered red rash over her left buttock.  She reports it is a little itchy.  She reports occasional nausea controled with zofran.  She had chemotherapy yesterday.  She denies having any bladder issues.  She denies rectal/vaginal bleeding.

## 2013-10-27 ENCOUNTER — Ambulatory Visit
Admission: RE | Admit: 2013-10-27 | Discharge: 2013-10-27 | Disposition: A | Discharge status: Home / Self Care | Payer: Medicaid Other | Source: Ambulatory Visit | Attending: Radiation Oncology | Admitting: Radiation Oncology

## 2013-10-27 DIAGNOSIS — Z510 Encounter for antineoplastic radiation therapy: Secondary | ICD-10-CM | POA: Diagnosis not present

## 2013-10-28 ENCOUNTER — Ambulatory Visit
Admission: RE | Admit: 2013-10-28 | Discharge: 2013-10-28 | Disposition: A | Discharge status: Home / Self Care | Payer: Medicaid Other | Source: Ambulatory Visit | Attending: Radiation Oncology | Admitting: Radiation Oncology

## 2013-10-28 DIAGNOSIS — Z510 Encounter for antineoplastic radiation therapy: Secondary | ICD-10-CM | POA: Diagnosis not present

## 2013-10-29 ENCOUNTER — Ambulatory Visit
Admission: RE | Admit: 2013-10-29 | Discharge: 2013-10-29 | Disposition: A | Discharge status: Home / Self Care | Payer: Medicaid Other | Source: Ambulatory Visit | Attending: Radiation Oncology | Admitting: Radiation Oncology

## 2013-10-29 ENCOUNTER — Other Ambulatory Visit: Payer: Self-pay | Admitting: Oncology

## 2013-10-29 DIAGNOSIS — Z510 Encounter for antineoplastic radiation therapy: Secondary | ICD-10-CM | POA: Diagnosis not present

## 2013-11-01 ENCOUNTER — Other Ambulatory Visit: Payer: Self-pay | Admitting: Oncology

## 2013-11-01 ENCOUNTER — Ambulatory Visit (HOSPITAL_BASED_OUTPATIENT_CLINIC_OR_DEPARTMENT_OTHER): Payer: Medicaid Other

## 2013-11-01 ENCOUNTER — Ambulatory Visit
Admission: RE | Admit: 2013-11-01 | Discharge: 2013-11-01 | Disposition: A | Discharge status: Home / Self Care | Payer: Medicaid Other | Source: Ambulatory Visit | Attending: Radiation Oncology | Admitting: Radiation Oncology

## 2013-11-01 ENCOUNTER — Other Ambulatory Visit (HOSPITAL_BASED_OUTPATIENT_CLINIC_OR_DEPARTMENT_OTHER): Payer: Medicaid Other

## 2013-11-01 ENCOUNTER — Ambulatory Visit: Payer: Medicaid Other | Admitting: Nutrition

## 2013-11-01 VITALS — BP 114/74 | HR 92 | Temp 97.4°F | Resp 17

## 2013-11-01 DIAGNOSIS — Z5111 Encounter for antineoplastic chemotherapy: Secondary | ICD-10-CM

## 2013-11-01 DIAGNOSIS — Z510 Encounter for antineoplastic radiation therapy: Secondary | ICD-10-CM | POA: Diagnosis not present

## 2013-11-01 DIAGNOSIS — C539 Malignant neoplasm of cervix uteri, unspecified: Secondary | ICD-10-CM

## 2013-11-01 DIAGNOSIS — C770 Secondary and unspecified malignant neoplasm of lymph nodes of head, face and neck: Secondary | ICD-10-CM

## 2013-11-01 LAB — COMPREHENSIVE METABOLIC PANEL (CC13)
ALT: 10 U/L (ref 0–55)
AST: 13 U/L (ref 5–34)
Albumin: 3.3 g/dL — ABNORMAL LOW (ref 3.5–5.0)
Alkaline Phosphatase: 73 U/L (ref 40–150)
Anion Gap: 9 mEq/L (ref 3–11)
BUN: 10.5 mg/dL (ref 7.0–26.0)
CALCIUM: 9 mg/dL (ref 8.4–10.4)
CO2: 24 meq/L (ref 22–29)
CREATININE: 0.7 mg/dL (ref 0.6–1.1)
Chloride: 105 mEq/L (ref 98–109)
Glucose: 113 mg/dl (ref 70–140)
Potassium: 3.7 mEq/L (ref 3.5–5.1)
Sodium: 139 mEq/L (ref 136–145)
Total Bilirubin: 0.42 mg/dL (ref 0.20–1.20)
Total Protein: 6.6 g/dL (ref 6.4–8.3)

## 2013-11-01 LAB — CBC WITH DIFFERENTIAL/PLATELET
BASO%: 0.2 % (ref 0.0–2.0)
BASOS ABS: 0 10*3/uL (ref 0.0–0.1)
EOS ABS: 0 10*3/uL (ref 0.0–0.5)
EOS%: 0.7 % (ref 0.0–7.0)
HEMATOCRIT: 32.5 % — AB (ref 34.8–46.6)
HEMOGLOBIN: 10.9 g/dL — AB (ref 11.6–15.9)
LYMPH%: 9.3 % — ABNORMAL LOW (ref 14.0–49.7)
MCH: 30.2 pg (ref 25.1–34.0)
MCHC: 33.5 g/dL (ref 31.5–36.0)
MCV: 90 fL (ref 79.5–101.0)
MONO#: 0.6 10*3/uL (ref 0.1–0.9)
MONO%: 12.4 % (ref 0.0–14.0)
NEUT%: 77.4 % — AB (ref 38.4–76.8)
NEUTROS ABS: 3.6 10*3/uL (ref 1.5–6.5)
Platelets: 198 10*3/uL (ref 145–400)
RBC: 3.61 10*6/uL — ABNORMAL LOW (ref 3.70–5.45)
RDW: 13.5 % (ref 11.2–14.5)
WBC: 4.6 10*3/uL (ref 3.9–10.3)
lymph#: 0.4 10*3/uL — ABNORMAL LOW (ref 0.9–3.3)

## 2013-11-01 LAB — MAGNESIUM (CC13): Magnesium: 2.1 mg/dl (ref 1.5–2.5)

## 2013-11-01 MED ORDER — POTASSIUM CHLORIDE 2 MEQ/ML IV SOLN
Freq: Once | INTRAVENOUS | Status: AC
  Administered 2013-11-01: 10:00:00 via INTRAVENOUS
  Filled 2013-11-01: qty 10

## 2013-11-01 MED ORDER — CISPLATIN CHEMO INJECTION 100MG/100ML
40.0000 mg/m2 | Freq: Once | INTRAVENOUS | Status: AC
  Administered 2013-11-01: 70 mg via INTRAVENOUS
  Filled 2013-11-01: qty 70

## 2013-11-01 MED ORDER — FOSAPREPITANT DIMEGLUMINE INJECTION 150 MG
150.0000 mg | Freq: Once | INTRAVENOUS | Status: AC
  Administered 2013-11-01: 150 mg via INTRAVENOUS
  Filled 2013-11-01: qty 5

## 2013-11-01 MED ORDER — ONDANSETRON 16 MG/50ML IVPB (CHCC)
16.0000 mg | Freq: Once | INTRAVENOUS | Status: AC
  Administered 2013-11-01: 16 mg via INTRAVENOUS

## 2013-11-01 MED ORDER — ONDANSETRON 16 MG/50ML IVPB (CHCC)
INTRAVENOUS | Status: AC
  Filled 2013-11-01: qty 16

## 2013-11-01 MED ORDER — DEXAMETHASONE SODIUM PHOSPHATE 20 MG/5ML IJ SOLN
12.0000 mg | Freq: Once | INTRAMUSCULAR | Status: AC
  Administered 2013-11-01: 12 mg via INTRAVENOUS

## 2013-11-01 MED ORDER — DEXAMETHASONE SODIUM PHOSPHATE 20 MG/5ML IJ SOLN
INTRAMUSCULAR | Status: AC
  Filled 2013-11-01: qty 5

## 2013-11-01 MED ORDER — SODIUM CHLORIDE 0.9 % IV SOLN
Freq: Once | INTRAVENOUS | Status: AC
  Administered 2013-11-01: 10:00:00 via INTRAVENOUS

## 2013-11-01 NOTE — Progress Notes (Signed)
1019- Patient taken to radiology for radiation tx. Pre-hydration fluids infusing. IV clean, dry, intact.  1050- Patient returned back to unit from radiation. IV site unremarkable.

## 2013-11-01 NOTE — Progress Notes (Signed)
Met with Pt in chemo room for follow up.  Pt reports doing well.  C/O some changes in taste and a little nausea, but eating well and appetite is much improved.  Wt 10/26/13 153.4lbs up 6lbs since assessment on 10/18/13.  Pt states she has not needed supplements, but has them in case.    Nutrition Dx:  Food and nutrition related knowledge deficit improved.  Intervention:  Encouraged good po intake and use of supplements if needed for wt maintenance.   Monitoring, evaluation, and goals:  Pt will tolerated adequate calories and protein intake to support healing and wt maintenance.    Next visit:  To be scheduled as needed.

## 2013-11-01 NOTE — Patient Instructions (Signed)
[facility] Discharge Instructions for Patients Receiving Chemotherapy  Today you received the following chemotherapy agents: Cisplatin  To help prevent nausea and vomiting after your treatment, we encourage you to take your nausea medication as prescribed by your physician.    If you develop nausea and vomiting that is not controlled by your nausea medication, call the clinic.   BELOW ARE SYMPTOMS THAT SHOULD BE REPORTED IMMEDIATELY:  *FEVER GREATER THAN 100.5 F  *CHILLS WITH OR WITHOUT FEVER  NAUSEA AND VOMITING THAT IS NOT CONTROLLED WITH YOUR NAUSEA MEDICATION  *UNUSUAL SHORTNESS OF BREATH  *UNUSUAL BRUISING OR BLEEDING  TENDERNESS IN MOUTH AND THROAT WITH OR WITHOUT PRESENCE OF ULCERS  *URINARY PROBLEMS  *BOWEL PROBLEMS  UNUSUAL RASH Items with * indicate a potential emergency and should be followed up as soon as possible.  Feel free to call the clinic you have any questions or concerns. The clinic phone number is [phone].

## 2013-11-02 ENCOUNTER — Ambulatory Visit
Admission: RE | Admit: 2013-11-02 | Discharge: 2013-11-02 | Disposition: A | Discharge status: Home / Self Care | Payer: Medicaid Other | Source: Ambulatory Visit | Attending: Radiation Oncology | Admitting: Radiation Oncology

## 2013-11-02 ENCOUNTER — Other Ambulatory Visit: Payer: Self-pay | Admitting: Oncology

## 2013-11-02 ENCOUNTER — Encounter: Payer: Self-pay | Admitting: Radiation Oncology

## 2013-11-02 VITALS — BP 122/76 | HR 92 | Temp 97.9°F | Ht 65.0 in | Wt 151.7 lb

## 2013-11-02 DIAGNOSIS — Z510 Encounter for antineoplastic radiation therapy: Secondary | ICD-10-CM | POA: Diagnosis not present

## 2013-11-02 DIAGNOSIS — C539 Malignant neoplasm of cervix uteri, unspecified: Secondary | ICD-10-CM

## 2013-11-02 NOTE — Progress Notes (Signed)
  Radiation Oncology         [phone] ________________________________  Name: Karen Daniels MRN: [id number]  Date: 11/02/2013  DOB: 11-Apr-1976  Weekly Radiation Therapy Management  Cervical cancer   Primary site: Cervix Uteri   Staging method: AJCC 7th Edition   Clinical: Stage IB2 (T1b2, N0, M0)    Summary: Stage IB2 (T1b2, N0, M0)   Current Dose: 28.8 Gy     Planned Dose:  45 + Gy  Narrative . . . . . . . . The patient presents for routine under treatment assessment.                                   The patient is without complaint except for her sensitivity of the tissues in the perineum. Patient has started using Epsom salts with sitz bath. She has not started using Desitin. Patient has had some diarrhea and I recommended she use Imodium to control this issue. Patient has had some nausea but seems Zofran may actually make this worse. She does have some fatigue. Patient did receive chemotherapy yesterday.                                 Set-up films were reviewed.                                 The chart was checked. Physical Findings. . .  height is 5\' 5"  (1.651 m) and weight is 151 lb 11.2 oz (68.811 kg). Her oral temperature is 97.9 F (36.6 C). Her blood pressure is 122/76 and her pulse is 92. . The lungs are clear. The heart has a regular rhythm and rate. The abdomen is soft and nontender with normal bowel sounds. On pelvic examination the patient has a vescular reaction along the right labia suspicious for herpetic  lesion.  On bimanual and rectovaginal examination the patient has had shrinkage of her large cervical mass but still continues to be quite significant , approximately 5-6 cm Impression . . . . . . . The patient is tolerating radiation. Plan . . . . . . . . . . . . Continue treatment as planned. I will discuss with medical oncology  starting Valtrex. Patient will be seen by Dr. Marko Plume tomorrow.  I may consider in postponing her scheduled implant next week to allow  for further tumor shrinkage with external beam and radiosensitizing chemotherapy.  ________________________________   Blair Promise, PhD, MD

## 2013-11-02 NOTE — Progress Notes (Signed)
Karen Daniels has completed 16 fractions to her pelvis.  She reports pain in her rectal/vaginal area due to skin irritation.  She reports that her skin is worse and she has noticed is spreading towards her vaginal area.  She has not started using desitin yet.  She has been taking warm baths with epsom salts.  She has been given a sitz bath to use as well.   She also reports itching in her vaginal area.  She also saw some blood when wiping today.  She has been having diarrhea after she eats.  Recommended that she take Imodium as needed.  She reports having nausea and says that Zofran makes it worse.  She reports having fatigue.  She had chemotherapy yesterday.

## 2013-11-03 ENCOUNTER — Encounter (HOSPITAL_BASED_OUTPATIENT_CLINIC_OR_DEPARTMENT_OTHER): Payer: Self-pay | Admitting: *Deleted

## 2013-11-03 ENCOUNTER — Ambulatory Visit (HOSPITAL_BASED_OUTPATIENT_CLINIC_OR_DEPARTMENT_OTHER): Payer: Medicaid Other | Admitting: Oncology

## 2013-11-03 ENCOUNTER — Encounter: Payer: Self-pay | Admitting: Oncology

## 2013-11-03 ENCOUNTER — Ambulatory Visit
Admission: RE | Admit: 2013-11-03 | Discharge: 2013-11-03 | Disposition: A | Discharge status: Home / Self Care | Payer: Medicaid Other | Source: Ambulatory Visit | Attending: Radiation Oncology | Admitting: Radiation Oncology

## 2013-11-03 VITALS — BP 114/67 | HR 77 | Temp 98.1°F | Resp 18 | Ht 65.0 in | Wt 155.4 lb

## 2013-11-03 DIAGNOSIS — Z510 Encounter for antineoplastic radiation therapy: Secondary | ICD-10-CM | POA: Diagnosis not present

## 2013-11-03 DIAGNOSIS — A6000 Herpesviral infection of urogenital system, unspecified: Secondary | ICD-10-CM

## 2013-11-03 DIAGNOSIS — Z87891 Personal history of nicotine dependence: Secondary | ICD-10-CM

## 2013-11-03 DIAGNOSIS — C539 Malignant neoplasm of cervix uteri, unspecified: Secondary | ICD-10-CM

## 2013-11-03 MED ORDER — ACYCLOVIR 400 MG PO TABS: 400.0000 mg | ORAL_TABLET | Freq: Three times a day (TID) | ORAL | Status: DC

## 2013-11-03 NOTE — Progress Notes (Signed)
NPO AFTER MN. ARRIVE AT 0600. CURRENT LAB RESULTS IN CHART AND EPIC. WILL TAKE BIRTH CONTROL PILL AM DOS W/ SIPS OF WATER .

## 2013-11-03 NOTE — Progress Notes (Signed)
OFFICE PROGRESS NOTE   11/03/2013   Physicians:Paola Gehrig; J.Kinard, C.Harraway-Smith   INTERVAL HISTORY:   [name], accompanied to office by mother, in continuing attention to clinical IB2 adenocarcinoma of cervix, continuing radiation and sensitizing cisplatin chemotherapy. Radiation is to complete 11-22-13 and plan now is to give total 6 cycles of weekly cisplatin, thru 11-15-13.   Patient has had recurrent genital herpes for past few days, about which Dr Sondra Come notified this office on 11-02-13, however nursing was unable to reach patient about treatment prior to her visit today. Lesions are perirectal and in vulvar area, very uncomfortable. She has felt "flu-like" with general mild soreness and notices slight fluid retention UE bilaterally, without fever.  She reports only prior genital herpes outbreak was 2012, treated in ED then, however medication too expensive to complete prescribed course afterwards.  She is otherwise tolerating radiation and chemotherapy well, with no significant nausea, no bleeding now, po intake fairly good tho she has not been drinking as much today.    She does not have central catheter.  ONCOLOGIC HISTORY   Cervical cancer   08/30/2013 Initial Diagnosis Cervical cancer  Patient has not had regular medical care in some years. She has had some bleeding with intercourse as well as regular menstrual periods, and recently increased vaginal discharge with fatigue, lethargy, SOB and flu-like symptoms. She was seen at ED on 08-29-13 with finding of large cervical mass, friable on physical exam and confirmed by [place]. The vaginal discharge had few clue cells, negative for trich and yeast. Pregnancy test was negative. She was seen by Dr Ihor Dow, with cervical biopsy ( [id number]) with invasive adenocarcinoma of cervix, no LVSI noted. She saw Dr Alycia Rossetti on 09-07-13, with exam remarkable for large mass filling vagina ~ 8 cm, free of vaginal sidewalls, cervix ~ 4-5 cm,  no parametrial involvement, for clinical IB2 adenocarcinoma of cervix. After discussion options, decision was made to use definitive chemoradiation. PET showed positive pelvic nodes, with 7.3 x 6.9 cm cervical mass and no uptake chest/liver/skeleton or other. She had consultation with Dr Sondra Come on 09-30-13, with simulation on 10-01-13, anticipating 45 Gy in 25 fractions with radiosensitizing chemotherapy, then consideration of IMRT boost to PET positive nodes in pelvis, and 5 high-dose rate intracavitary brachtherapy treatments. First weekly CDDP was 10-13-13   Review of systems as above, also: No respiratory symptoms. No other pain. No LE swelling. No hair loss.  Remainder of 10 point Review of Systems negative.  Objective:  Vital signs in last 24 hours:  BP 114/67  Pulse 77  Temp(Src) 98.1 F (36.7 C) (Oral)  Resp 18  Ht 5\' 5"  (1.651 m)  Wt 155 lb 6.4 oz (70.489 kg)  BMI 25.86 kg/m2  LMP 10/15/2013 weight is up 4 lbs.  Alert, oriented and appropriate. Ambulatory without difficulty.  No alopecia  HEENT:PERRL, sclerae not icteric. Oral mucosa moist without lesions, posterior pharynx clear.  Neck supple. No JVD.  Lymphatics:no cervical,suraclavicular adenopathy Resp: clear to auscultation bilaterally and normal percussion bilaterally Cardio: regular rate and rhythm. No gallop. GI: soft, nontender, not distended, no mass or organomegaly. Normally active bowel sounds. Musculoskeletal/ Extremities: without pitting edema, cords, tenderness. Forearms minimally puffy, no pitting edema, no problems at sites of previous IV access. Neuro: no peripheral neuropathy. Otherwise nonfocal Skin: vulva moderately erythematous, no desquamation, herpetic type lesions bilaterally and clear vaginal discharge. Similar tiny ulcers perirectal out onto inner buttocks bilaterally.   Lab Results:  Results for orders placed in visit on 11/01/13  CBC WITH DIFFERENTIAL      Result Value Ref Range   WBC 4.6  3.9  - 10.3 10e3/uL   NEUT# 3.6  1.5 - 6.5 10e3/uL   HGB 10.9 (*) 11.6 - 15.9 g/dL   HCT 32.5 (*) 34.8 - 46.6 %   Platelets 198  145 - 400 10e3/uL   MCV 90.0  79.5 - 101.0 fL   MCH 30.2  25.1 - 34.0 pg   MCHC 33.5  31.5 - 36.0 g/dL   RBC 3.61 (*) 3.70 - 5.45 10e6/uL   RDW 13.5  11.2 - 14.5 %   lymph# 0.4 (*) 0.9 - 3.3 10e3/uL   MONO# 0.6  0.1 - 0.9 10e3/uL   Eosinophils Absolute 0.0  0.0 - 0.5 10e3/uL   Basophils Absolute 0.0  0.0 - 0.1 10e3/uL   NEUT% 77.4 (*) 38.4 - 76.8 %   LYMPH% 9.3 (*) 14.0 - 49.7 %   MONO% 12.4  0.0 - 14.0 %   EOS% 0.7  0.0 - 7.0 %   BASO% 0.2  0.0 - 2.0 %  COMPREHENSIVE METABOLIC PANEL (WE31)      Result Value Ref Range   Sodium 139  136 - 145 mEq/L   Potassium 3.7  3.5 - 5.1 mEq/L   Chloride 105  98 - 109 mEq/L   CO2 24  22 - 29 mEq/L   Glucose 113  70 - 140 mg/dl   BUN 10.5  7.0 - 26.0 mg/dL   Creatinine 0.7  0.6 - 1.1 mg/dL   Total Bilirubin 0.42  0.20 - 1.20 mg/dL   Alkaline Phosphatase 73  40 - 150 U/L   AST 13  5 - 34 U/L   ALT 10  0 - 55 U/L   Total Protein 6.6  6.4 - 8.3 g/dL   Albumin 3.3 (*) 3.5 - 5.0 g/dL   Calcium 9.0  8.4 - 10.4 mg/dL   Anion Gap 9  3 - 11 mEq/L  MAGNESIUM (CC13)      Result Value Ref Range   Magnesium 2.1  1.5 - 2.5 mg/dl     Studies/Results:  No results found.  Medications: I have reviewed the patient's current medications. She declines prescription for percocet. Prescription sent for acyclovir 400 mg tid x 5 days, tho I did not understand at that time that she had never completed initial treatment course in 2012.  DISCUSSION: total # CDDP treatments discussed with Dr Alycia Rossetti prior to this visit, with recommendation that she have 6 cycles total, which is also appropriate with RT duration now. Patient is in agreement with this, so will have #5 on 7-27 and #6 on 11-15-13.  Assessment/Plan:  1.clinical IB2 adenocarcinoma of cervix, with PET + pelvic nodes (IIIB) in 38 yo lady: weekly sensitizing CDDP with radiation began  10-11-13, cycle 5 CDDP due 11-08-13 and cycle 6 on 11-15-13, with RT scheduled thru Mon 8-10. Weekly chemistries including Mg and blood counts.  I will see her back 2-3 weeks after last chemotherapy with follow up labs then. 2. Recurrent genital herpes: symptoms probably began ~ a week ago, initially felt to be radiation skin reaction. She did not have full treatment with primary event in 2012 and is somewhat immunocompromised now from chemotherapy and steroids used for nausea with chemo. Begin acyclovir today and treat full dose x 5-10 days, then would keep on suppressive dose until 1-2 months beyond completion of chemo/RT. Will DC decadron from chemo premeds last 2 cycles. 3.Peripheral IV access  not easy but manageable thus far. PICC could be placed for last 1-2 treatments if necessary (flushes at [facility] if so) 4.recently discontinued tobacco    Patient understood discussion and is in agreement with plan. She is very motivated to continue prescribed treatment for the cervical cancer.   Following visit, I cannot find HIV testing in this EMR, which I believe would show in EMR also if done by gyn initially, and not in available information from ED 2012.   Chemotherapy orders confirmed, antiemetics adjusted.  Gordy Levan, MD   11/03/2013, 4:24 PM

## 2013-11-04 ENCOUNTER — Telehealth: Payer: Self-pay | Admitting: Oncology

## 2013-11-04 ENCOUNTER — Ambulatory Visit
Admission: RE | Admit: 2013-11-04 | Discharge: 2013-11-04 | Disposition: A | Discharge status: Home / Self Care | Payer: Medicaid Other | Source: Ambulatory Visit | Attending: Radiation Oncology | Admitting: Radiation Oncology

## 2013-11-04 DIAGNOSIS — Z510 Encounter for antineoplastic radiation therapy: Secondary | ICD-10-CM | POA: Diagnosis not present

## 2013-11-04 NOTE — Telephone Encounter (Signed)
S/w pt gave appt 8/24. Also mailed appt calendar.

## 2013-11-05 ENCOUNTER — Telehealth: Payer: Self-pay | Admitting: *Deleted

## 2013-11-05 ENCOUNTER — Ambulatory Visit
Admission: RE | Admit: 2013-11-05 | Discharge: 2013-11-05 | Disposition: A | Discharge status: Home / Self Care | Payer: Medicaid Other | Source: Ambulatory Visit | Attending: Radiation Oncology | Admitting: Radiation Oncology

## 2013-11-05 ENCOUNTER — Other Ambulatory Visit: Payer: Self-pay | Admitting: Oncology

## 2013-11-05 ENCOUNTER — Telehealth: Payer: Self-pay | Admitting: Oncology

## 2013-11-05 ENCOUNTER — Other Ambulatory Visit: Payer: Self-pay | Admitting: Radiation Oncology

## 2013-11-05 ENCOUNTER — Telehealth: Payer: Self-pay

## 2013-11-05 DIAGNOSIS — K2100 Gastro-esophageal reflux disease with esophagitis, without bleeding: Secondary | ICD-10-CM

## 2013-11-05 DIAGNOSIS — C539 Malignant neoplasm of cervix uteri, unspecified: Secondary | ICD-10-CM

## 2013-11-05 DIAGNOSIS — A6009 Herpesviral infection of other urogenital tract: Secondary | ICD-10-CM

## 2013-11-05 DIAGNOSIS — Z510 Encounter for antineoplastic radiation therapy: Secondary | ICD-10-CM | POA: Diagnosis not present

## 2013-11-05 MED ORDER — PANTOPRAZOLE SODIUM 40 MG PO TBEC: 40.0000 mg | DELAYED_RELEASE_TABLET | Freq: Every day | ORAL | Status: DC

## 2013-11-05 MED ORDER — ACYCLOVIR 400 MG PO TABS: 400.0000 mg | ORAL_TABLET | Freq: Two times a day (BID) | ORAL | Status: DC

## 2013-11-05 MED ORDER — ACYCLOVIR 400 MG PO TABS: 400.0000 mg | ORAL_TABLET | Freq: Three times a day (TID) | ORAL | Status: DC

## 2013-11-05 NOTE — Telephone Encounter (Signed)
Message copied by Baruch Merl on Fri Nov 05, 2013  3:27 PM ------      Message from: Gordy Levan      Created: Fri Nov 05, 2013  9:56 AM       Please check on her by phone today            Is acyclovir helping? How symptomatic now?        If still significant symptoms now or if still at completion of 5 days of full dose, needs to have total 10 days of full dose. After full dose, needs acyclovir 400 mg twice daily suppressive for 1-2 months beyond completion of chemo RT if possible.      Did her medicaid cover the first prescription and will it cover additional when first script is completed?            What precautions needed for infusion bathroom/ BSC during treatment 7-27? Would consider her to have active lesions then, to be cautious.            How much diarrhea? Dr Clabe Seal note mentions recommending imodium. Is she drinking enough fluids now (not doing too well at my visit) - I can add extra before chemo if needed.             Remind her that she can void or have BM into sitz bath of water at home to minimize skin contact if that is painful                   thanks             ------

## 2013-11-05 NOTE — Telephone Encounter (Signed)
CALLED PATIENT TO INFORM OF TANDE[facility] RING PLACEMENT ON 11-23-13@ 7:30 AM @ [place], LVM FOR A RETURN CALL

## 2013-11-05 NOTE — Telephone Encounter (Signed)
Called [facility] to let her know that her tandem treatment for 7/28 has been canceled per Dr. Sondra Come.   Tabithia verbalized agreement.

## 2013-11-05 NOTE — Telephone Encounter (Signed)
Ms. Pauli stated that the acyclovir has not begun to help with pain from the genital herpetic lesions.  She actually had a new lesion develop yesterday.  Told her that Dr.Livesay wants her to have a 10 day course total of acyclovir 400 mg tid instead of 5. She needs to take acyclovir 400 mg bid suppressive for 1-2 months beyond the completion of chemo/RT. Sent prescriptions for additional 5 days and then a 30 day supply of the 400 mg bid with a refill. Medicaid covered the initial prescription of acyclovir. Ms. Gassner has been having diarrhea at least 5-6 times a day.  She has not begun the imodium AD.  Told her Dr. Marko Plume wants her to begin this today.  She is to take 2 tabs initially and then 1 tab after each loose stool not to exceed 8 tabs in a 24 hour period.  Told her to let the infusion nurse know Monday  if this is not helping control the diarrhea.  She may need a Lomotil.   Encouraged Ms. Bobbye Charleston to use the sitz bath for BM and Urination as this helps dilute and pull excretions away from the skin. Ms. Sorenson is drinking at least 64 oz of fluid a day. She states that her  throat is burning when swallowing acidic liquid or food.  Pt. Choosing more bland food at present and tolerates it better.  She feels bloated and experiencing some belching/reflux. Sent protonix 40 mg daily to her pharmacy per Dr. Marko Plume.  Told her to call the office if the protonix was not effective. Diiscussed contact isolation rationale while she is here for treatment on 11-08-13 and possibly 11-15-13 with genital herpetic lesions.  Pt. Verbalized understanding.

## 2013-11-06 ENCOUNTER — Other Ambulatory Visit: Payer: Self-pay | Admitting: Oncology

## 2013-11-08 ENCOUNTER — Other Ambulatory Visit (HOSPITAL_BASED_OUTPATIENT_CLINIC_OR_DEPARTMENT_OTHER): Payer: Medicaid Other

## 2013-11-08 ENCOUNTER — Ambulatory Visit
Admission: RE | Admit: 2013-11-08 | Discharge: 2013-11-08 | Disposition: A | Discharge status: Home / Self Care | Payer: Medicaid Other | Source: Ambulatory Visit | Attending: Radiation Oncology | Admitting: Radiation Oncology

## 2013-11-08 ENCOUNTER — Ambulatory Visit (HOSPITAL_BASED_OUTPATIENT_CLINIC_OR_DEPARTMENT_OTHER): Payer: Medicaid Other

## 2013-11-08 ENCOUNTER — Ambulatory Visit: Payer: Medicaid Other | Admitting: Nutrition

## 2013-11-08 VITALS — BP 120/78 | HR 87 | Temp 98.2°F | Resp 18

## 2013-11-08 DIAGNOSIS — Z5111 Encounter for antineoplastic chemotherapy: Secondary | ICD-10-CM

## 2013-11-08 DIAGNOSIS — C539 Malignant neoplasm of cervix uteri, unspecified: Secondary | ICD-10-CM

## 2013-11-08 DIAGNOSIS — Z510 Encounter for antineoplastic radiation therapy: Secondary | ICD-10-CM | POA: Diagnosis not present

## 2013-11-08 LAB — CBC WITH DIFFERENTIAL/PLATELET
BASO%: 0.3 % (ref 0.0–2.0)
BASOS ABS: 0 10*3/uL (ref 0.0–0.1)
EOS%: 0.5 % (ref 0.0–7.0)
Eosinophils Absolute: 0 10*3/uL (ref 0.0–0.5)
HEMATOCRIT: 35.2 % (ref 34.8–46.6)
HEMOGLOBIN: 11.9 g/dL (ref 11.6–15.9)
LYMPH#: 0.3 10*3/uL — AB (ref 0.9–3.3)
LYMPH%: 6 % — ABNORMAL LOW (ref 14.0–49.7)
MCH: 31.2 pg (ref 25.1–34.0)
MCHC: 33.8 g/dL (ref 31.5–36.0)
MCV: 92.3 fL (ref 79.5–101.0)
MONO#: 0.4 10*3/uL (ref 0.1–0.9)
MONO%: 8 % (ref 0.0–14.0)
NEUT#: 4.6 10*3/uL (ref 1.5–6.5)
NEUT%: 85.2 % — AB (ref 38.4–76.8)
Platelets: 217 10*3/uL (ref 145–400)
RBC: 3.82 10*6/uL (ref 3.70–5.45)
RDW: 13.7 % (ref 11.2–14.5)
WBC: 5.4 10*3/uL (ref 3.9–10.3)

## 2013-11-08 LAB — COMPREHENSIVE METABOLIC PANEL (CC13)
ALT: 10 U/L (ref 0–55)
ANION GAP: 10 meq/L (ref 3–11)
AST: 13 U/L (ref 5–34)
Albumin: 3.4 g/dL — ABNORMAL LOW (ref 3.5–5.0)
Alkaline Phosphatase: 80 U/L (ref 40–150)
BUN: 13.1 mg/dL (ref 7.0–26.0)
CALCIUM: 9.2 mg/dL (ref 8.4–10.4)
CHLORIDE: 103 meq/L (ref 98–109)
CO2: 24 mEq/L (ref 22–29)
CREATININE: 0.8 mg/dL (ref 0.6–1.1)
Glucose: 117 mg/dl (ref 70–140)
Potassium: 4 mEq/L (ref 3.5–5.1)
Sodium: 137 mEq/L (ref 136–145)
Total Bilirubin: 0.39 mg/dL (ref 0.20–1.20)
Total Protein: 6.9 g/dL (ref 6.4–8.3)

## 2013-11-08 LAB — MAGNESIUM (CC13): Magnesium: 1.9 mg/dl (ref 1.5–2.5)

## 2013-11-08 MED ORDER — PALONOSETRON HCL INJECTION 0.25 MG/5ML
INTRAVENOUS | Status: AC
  Filled 2013-11-08: qty 5

## 2013-11-08 MED ORDER — PALONOSETRON HCL INJECTION 0.25 MG/5ML
0.2500 mg | Freq: Once | INTRAVENOUS | Status: AC
  Administered 2013-11-08: 0.25 mg via INTRAVENOUS

## 2013-11-08 MED ORDER — SODIUM CHLORIDE 0.9 % IV SOLN
40.0000 mg/m2 | Freq: Once | INTRAVENOUS | Status: AC
  Administered 2013-11-08: 70 mg via INTRAVENOUS
  Filled 2013-11-08: qty 70

## 2013-11-08 MED ORDER — POTASSIUM CHLORIDE 2 MEQ/ML IV SOLN
Freq: Once | INTRAVENOUS | Status: AC
  Administered 2013-11-08: 10:00:00 via INTRAVENOUS
  Filled 2013-11-08: qty 10

## 2013-11-08 MED ORDER — ONDANSETRON 8 MG/50ML IVPB (CHCC): 8.0000 mg | Freq: Once | INTRAVENOUS | Status: DC

## 2013-11-08 MED ORDER — LORAZEPAM 1 MG PO TABS
0.5000 mg | ORAL_TABLET | Freq: Once | ORAL | Status: DC | PRN
Start: 2013-11-08 — End: 2013-11-08

## 2013-11-08 MED ORDER — SODIUM CHLORIDE 0.9 % IV SOLN
Freq: Once | INTRAVENOUS | Status: AC
  Administered 2013-11-08: 10:00:00 via INTRAVENOUS

## 2013-11-08 MED ORDER — SODIUM CHLORIDE 0.9 % IV SOLN
150.0000 mg | Freq: Once | INTRAVENOUS | Status: AC
  Administered 2013-11-08: 150 mg via INTRAVENOUS
  Filled 2013-11-08: qty 5

## 2013-11-08 MED ORDER — ONDANSETRON 8 MG/NS 50 ML IVPB
INTRAVENOUS | Status: AC
  Filled 2013-11-08: qty 8

## 2013-11-08 NOTE — Patient Instructions (Signed)
[facility] Discharge Instructions for Patients Receiving Chemotherapy  Today you received the following chemotherapy agents: Cisplatin.  To help prevent nausea and vomiting after your treatment, we encourage you to take your nausea medication.   If you develop nausea and vomiting that is not controlled by your nausea medication, call the clinic.   BELOW ARE SYMPTOMS THAT SHOULD BE REPORTED IMMEDIATELY:  *FEVER GREATER THAN 100.5 F  *CHILLS WITH OR WITHOUT FEVER  NAUSEA AND VOMITING THAT IS NOT CONTROLLED WITH YOUR NAUSEA MEDICATION  *UNUSUAL SHORTNESS OF BREATH  *UNUSUAL BRUISING OR BLEEDING  TENDERNESS IN MOUTH AND THROAT WITH OR WITHOUT PRESENCE OF ULCERS  *URINARY PROBLEMS  *BOWEL PROBLEMS  UNUSUAL RASH Items with * indicate a potential emergency and should be followed up as soon as possible.  Feel free to call the clinic you have any questions or concerns. The clinic phone number is [phone].

## 2013-11-08 NOTE — Progress Notes (Signed)
Patient reports she is doing better.  She reports diarrhea has resolved and her sore throat is improved.  Weight is increased slightly from 153.4 pounds and documented as 155.4 pounds July 22.  Patient verbalizes frustration with weight gain but understands this is in part the result of steroids and treatment.  Patient can verbalize importance of adequate calories and protein.  Nutrition diagnosis: Food and nutrition related knowledge deficit improved.  Intervention: Reviewed importance of healthy diet and adequate protein to promote weight maintenance.  Teach back method used.  Monitoring, evaluation, and goals: Patient is tolerating adequate calories and protein for minimal weight loss.  Next visit: Monday, August 3, during chemotherapy.  **Disclaimer: This note was dictated with voice recognition software. Similar sounding words can inadvertently be transcribed and this note may contain transcription errors which may not have been corrected upon publication of note.**

## 2013-11-09 ENCOUNTER — Ambulatory Visit
Admission: RE | Admit: 2013-11-09 | Discharge: 2013-11-09 | Disposition: A | Discharge status: Home / Self Care | Payer: Medicaid Other | Source: Ambulatory Visit | Attending: Radiation Oncology | Admitting: Radiation Oncology

## 2013-11-09 ENCOUNTER — Encounter: Payer: Self-pay | Admitting: Radiation Oncology

## 2013-11-09 ENCOUNTER — Encounter (HOSPITAL_COMMUNITY): Payer: Self-pay

## 2013-11-09 ENCOUNTER — Ambulatory Visit: Payer: Medicaid Other | Admitting: Radiation Oncology

## 2013-11-09 ENCOUNTER — Observation Stay (HOSPITAL_COMMUNITY)
Admission: AD | Admit: 2013-11-09 | Discharge: 2013-11-11 | Disposition: A | Discharge status: Home / Self Care | Payer: Medicaid Other | Source: Ambulatory Visit | Attending: Internal Medicine | Admitting: Internal Medicine

## 2013-11-09 ENCOUNTER — Telehealth: Payer: Self-pay | Admitting: Oncology

## 2013-11-09 ENCOUNTER — Ambulatory Visit: Payer: Medicaid Other

## 2013-11-09 ENCOUNTER — Ambulatory Visit (HOSPITAL_BASED_OUTPATIENT_CLINIC_OR_DEPARTMENT_OTHER): Payer: Medicaid Other | Admitting: Gynecologic Oncology

## 2013-11-09 ENCOUNTER — Encounter: Payer: Self-pay | Admitting: Gynecologic Oncology

## 2013-11-09 ENCOUNTER — Observation Stay (HOSPITAL_BASED_OUTPATIENT_CLINIC_OR_DEPARTMENT_OTHER): Admission: RE | Admit: 2013-11-09 | Payer: Medicaid Other | Source: Ambulatory Visit | Admitting: Radiation Oncology

## 2013-11-09 VITALS — BP 106/69 | HR 78 | Temp 97.4°F

## 2013-11-09 DIAGNOSIS — Z8619 Personal history of other infectious and parasitic diseases: Secondary | ICD-10-CM | POA: Diagnosis not present

## 2013-11-09 DIAGNOSIS — Z872 Personal history of diseases of the skin and subcutaneous tissue: Secondary | ICD-10-CM | POA: Diagnosis not present

## 2013-11-09 DIAGNOSIS — N939 Abnormal uterine and vaginal bleeding, unspecified: Secondary | ICD-10-CM

## 2013-11-09 DIAGNOSIS — F329 Major depressive disorder, single episode, unspecified: Secondary | ICD-10-CM | POA: Diagnosis not present

## 2013-11-09 DIAGNOSIS — Z79899 Other long term (current) drug therapy: Secondary | ICD-10-CM | POA: Diagnosis not present

## 2013-11-09 DIAGNOSIS — D649 Anemia, unspecified: Secondary | ICD-10-CM | POA: Diagnosis present

## 2013-11-09 DIAGNOSIS — D62 Acute posthemorrhagic anemia: Secondary | ICD-10-CM | POA: Diagnosis not present

## 2013-11-09 DIAGNOSIS — C539 Malignant neoplasm of cervix uteri, unspecified: Secondary | ICD-10-CM

## 2013-11-09 DIAGNOSIS — N888 Other specified noninflammatory disorders of cervix uteri: Secondary | ICD-10-CM

## 2013-11-09 DIAGNOSIS — F3289 Other specified depressive episodes: Secondary | ICD-10-CM | POA: Diagnosis not present

## 2013-11-09 DIAGNOSIS — N898 Other specified noninflammatory disorders of vagina: Secondary | ICD-10-CM

## 2013-11-09 DIAGNOSIS — Z510 Encounter for antineoplastic radiation therapy: Secondary | ICD-10-CM | POA: Diagnosis not present

## 2013-11-09 DIAGNOSIS — F411 Generalized anxiety disorder: Secondary | ICD-10-CM | POA: Insufficient documentation

## 2013-11-09 HISTORY — DX: Malignant neoplasm of cervix uteri, unspecified: C53.9

## 2013-11-09 LAB — BASIC METABOLIC PANEL (CC13)
ANION GAP: 6 meq/L (ref 3–11)
BUN: 13.9 mg/dL (ref 7.0–26.0)
CALCIUM: 8.6 mg/dL (ref 8.4–10.4)
CO2: 26 meq/L (ref 22–29)
Chloride: 103 mEq/L (ref 98–109)
Creatinine: 0.7 mg/dL (ref 0.6–1.1)
Glucose: 90 mg/dl (ref 70–140)
Potassium: 4.4 mEq/L (ref 3.5–5.1)
Sodium: 135 mEq/L — ABNORMAL LOW (ref 136–145)

## 2013-11-09 LAB — IRON AND TIBC
Iron: 88 ug/dL (ref 42–135)
Saturation Ratios: 38 % (ref 20–55)
TIBC: 230 ug/dL — AB (ref 250–470)
UIBC: 142 ug/dL (ref 125–400)

## 2013-11-09 LAB — CBC WITH DIFFERENTIAL/PLATELET
BASO%: 0.2 % (ref 0.0–2.0)
Basophils Absolute: 0 10*3/uL (ref 0.0–0.1)
EOS ABS: 0 10*3/uL (ref 0.0–0.5)
EOS%: 0.6 % (ref 0.0–7.0)
HCT: 29.7 % — ABNORMAL LOW (ref 34.8–46.6)
HGB: 9.9 g/dL — ABNORMAL LOW (ref 11.6–15.9)
LYMPH%: 4.7 % — ABNORMAL LOW (ref 14.0–49.7)
MCH: 30.5 pg (ref 25.1–34.0)
MCHC: 33.2 g/dL (ref 31.5–36.0)
MCV: 91.7 fL (ref 79.5–101.0)
MONO#: 0.6 10*3/uL (ref 0.1–0.9)
MONO%: 10.9 % (ref 0.0–14.0)
NEUT%: 83.6 % — ABNORMAL HIGH (ref 38.4–76.8)
NEUTROS ABS: 4.6 10*3/uL (ref 1.5–6.5)
Platelets: 197 10*3/uL (ref 145–400)
RBC: 3.24 10*6/uL — AB (ref 3.70–5.45)
RDW: 13.9 % (ref 11.2–14.5)
WBC: 5.5 10*3/uL (ref 3.9–10.3)
lymph#: 0.3 10*3/uL — ABNORMAL LOW (ref 0.9–3.3)

## 2013-11-09 LAB — HOLD TUBE, BLOOD BANK

## 2013-11-09 SURGERY — INSERTION, UTERINE TANDEM AND RING OR CYLINDER, FOR BRACHYTHERAPY
Anesthesia: General

## 2013-11-09 MED ORDER — OXYCODONE-ACETAMINOPHEN 5-325 MG PO TABS
1.0000 | ORAL_TABLET | Freq: Once | ORAL | Status: DC
  Filled 2013-11-09: qty 1

## 2013-11-09 MED ORDER — LOPERAMIDE HCL 2 MG PO TABS
2.0000 mg | ORAL_TABLET | Freq: Four times a day (QID) | ORAL | Status: DC | PRN
  Filled 2013-11-09: qty 1

## 2013-11-09 MED ORDER — NORGESTIM-ETH ESTRAD TRIPHASIC 0.18/0.215/0.25 MG-25 MCG PO TABS
1.0000 | ORAL_TABLET | Freq: Every morning | ORAL | Status: DC
  Filled 2013-11-09: qty 1

## 2013-11-09 MED ORDER — ACETAMINOPHEN 325 MG PO TABS: 650.0000 mg | ORAL_TABLET | Freq: Four times a day (QID) | ORAL | Status: DC | PRN

## 2013-11-09 MED ORDER — SODIUM CHLORIDE 0.9 % IV SOLN
INTRAVENOUS | Status: DC
  Administered 2013-11-10 – 2013-11-11 (×2): via INTRAVENOUS

## 2013-11-09 MED ORDER — ACETAMINOPHEN 650 MG RE SUPP: 650.0000 mg | Freq: Four times a day (QID) | RECTAL | Status: DC | PRN

## 2013-11-09 MED ORDER — PANTOPRAZOLE SODIUM 40 MG PO TBEC
40.0000 mg | DELAYED_RELEASE_TABLET | Freq: Every day | ORAL | Status: DC
  Filled 2013-11-09 (×3): qty 1

## 2013-11-09 MED ORDER — OXYCODONE-ACETAMINOPHEN 5-325 MG PO TABS
1.0000 | ORAL_TABLET | Freq: Three times a day (TID) | ORAL | Status: DC | PRN
  Administered 2013-11-09 – 2013-11-10 (×2): 1 via ORAL
  Filled 2013-11-09 (×2): qty 1

## 2013-11-09 MED ORDER — LORAZEPAM 0.5 MG PO TABS
0.5000 mg | ORAL_TABLET | Freq: Four times a day (QID) | ORAL | Status: DC | PRN
  Administered 2013-11-09 – 2013-11-10 (×3): 0.5 mg via ORAL
  Filled 2013-11-09 (×3): qty 1

## 2013-11-09 MED ORDER — ACYCLOVIR 400 MG PO TABS
400.0000 mg | ORAL_TABLET | Freq: Two times a day (BID) | ORAL | Status: DC
  Administered 2013-11-09 – 2013-11-11 (×4): 400 mg via ORAL
  Filled 2013-11-09 (×6): qty 1

## 2013-11-09 MED ORDER — ONDANSETRON HCL 4 MG PO TABS
4.0000 mg | ORAL_TABLET | Freq: Four times a day (QID) | ORAL | Status: DC | PRN
  Filled 2013-11-09: qty 1

## 2013-11-09 MED ORDER — ONDANSETRON HCL 4 MG/2ML IJ SOLN
4.0000 mg | Freq: Four times a day (QID) | INTRAMUSCULAR | Status: DC | PRN
  Administered 2013-11-10: 4 mg via INTRAVENOUS
  Filled 2013-11-09: qty 2

## 2013-11-09 NOTE — H&P (Signed)
[facility] History and Physical  KINSLY HILD JSE:[id number] DOB: [birth year] DOA: 11/09/2013  Referring physician:  PCP: No PCP Per Patient  Specialists:   Chief Complaint: vaginal bleeding   HPI: Karen Daniels is a 38 y.o. 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 Denman George office;  Patient denies chest pain, no SOB, had mild nausea, no vomiting, no abdominal pain, no diarrhea; no focal neuro symptoms; afebrile but reports some chills  -Hospitalist asked for evaluation adn observation for anemia/bleeding     on chemo/radiation presented with   Review of Systems: The patient denies anorexia, fever, weight loss,, vision loss, decreased hearing, hoarseness, chest pain, syncope, dyspnea on exertion, peripheral edema, balance deficits, hemoptysis, abdominal pain, melena, hematochezia, severe indigestion/heartburn, hematuria, incontinence, genital sores, muscle weakness, suspicious skin lesions, transient blindness, difficulty walking, depression, unusual weight change, abnormal bleeding, enlarged lymph nodes, angioedema, and breast masses.    Past Medical History  Diagnosis Date  . 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 Anxiety   . Depression   . Adenocarcinoma of cervix, stage 1     dx  may 2015   STAGE I B2 (+pet scan pelvic nodes for mets)---  oncologist--  dr Marko Plume and dr Sondra Come--  chemo and external radiation    . Blister of genital area   . Itching in the vaginal area    Past Surgical History  Procedure Laterality Date  . Wisdom tooth extraction    . Colposcopy    . Diagnostic laparoscopy  07-28-2000    w/ lysis adhesions  . Colonoscopy  07-11-1999   Social History:  reports that she quit smoking about 2 months ago. Her smoking use  included Cigarettes. She has a 1 pack-year smoking history. She has never used smokeless tobacco. She reports that she drinks alcohol. She reports that she does not use illicit drugs. Home;  where does patient live--home, ALF, SNF? and with whom if at home? Yes;  Can patient participate in ADLs?  No Known Allergies  Family History  Problem Relation Age of Onset  . Heart disease Father   . COPD Father   . Diabetes Father   . Hypertension Father   . Arthritis Father   . Arthritis Mother   . Breast cancer Maternal Aunt   . Diabetes Maternal Grandmother   . Cancer Maternal Grandmother     skin cancer  . Heart disease Maternal Grandmother   . Breast cancer Paternal Grandmother   . Cancer Maternal Aunt     lung    (be sure to complete)  Prior to Admission medications   Medication Sig Start Date End Date Taking? Authorizing Provider  acyclovir (ZOVIRAX) 400 MG tablet Take 1 tablet (400 mg total) by mouth 3 (three) times daily. 11/03/13   Lennis Marion Downer, MD  acyclovir (ZOVIRAX) 400 MG tablet Take 1 tablet (400 mg total) by mouth 3 (three) times daily. 11/05/13   Lennis Marion Downer, MD  acyclovir (ZOVIRAX) 400 MG tablet Take 1 tablet (400 mg total) by mouth 2 (two) times daily. Begin after 10 day full course dose for suppression of lesions for 1-2 months beyond completition of chemotherapy/RT. 11/05/13   Lennis Marion Downer, MD  loperamide (IMODIUM A-D) 2 MG tablet Take 2 mg by mouth 4 (four) times daily as needed for diarrhea  or loose stools.    Historical Provider, MD  LORazepam (ATIVAN) 0.5 MG tablet Take 1 -2 tablets by mouth or under the tongue every 6 hours as needed for nausea. 10/22/13   Lennis Marion Downer, MD  Norgestimate-Ethinyl Estradiol Triphasic 0.18/0.215/0.25 MG-25 MCG tab Take 1 tablet by mouth every morning. 10/18/13   Lavonia Drafts, MD  ondansetron (ZOFRAN) 8 MG tablet Take one tablet every 8 hours as needed for nausea. Start the 3rd day after chemotherapy. 10/11/13   Lennis Marion Downer, MD  pantoprazole (PROTONIX) 40 MG tablet Take 1 tablet (40 mg total) by mouth daily. 11/05/13   Lennis Marion Downer, MD   Physical Exam: Filed Vitals:   11/09/13 1632  BP: 96/76  Pulse: 86  Temp: 98.3 F (36.8 C)  Resp: 16     General:  alert  Eyes: eom-i  ENT: no oral ulcers   Neck: supple   Cardiovascular: s1,s2 rrr  Respiratory: CTA BL  Abdomen: soft, nt,nd   Skin: no rash  Musculoskeletal: no LE edema  Psychiatric: no hallucinations   Neurologic: CN 212 intact; motor 5/5 BL  Labs on Admission:  Basic Metabolic Panel:  Recent Labs Lab 11/08/13 0856 11/08/13 0857 11/09/13 1327  NA  --  137 135*  K  --  4.0 4.4  CO2  --  24 26  GLUCOSE  --  117 90  BUN  --  13.1 13.9  CREATININE  --  0.8 0.7  CALCIUM  --  9.2 8.6  MG 1.9  --   --    Liver Function Tests:  Recent Labs Lab 11/08/13 0857  AST 13  ALT 10  ALKPHOS 80  BILITOT 0.39  PROT 6.9  ALBUMIN 3.4*   No results found for this basename: LIPASE, AMYLASE,  in the last 168 hours No results found for this basename: AMMONIA,  in the last 168 hours CBC:  Recent Labs Lab 11/08/13 0856 11/09/13 1326  WBC 5.4 5.5  NEUTROABS 4.6 4.6  HGB 11.9 9.9*  HCT 35.2 29.7*  MCV 92.3 91.7  PLT 217 197   Cardiac Enzymes: No results found for this basename: CKTOTAL, CKMB, CKMBINDEX, TROPONINI,  in the last 168 hours  BNP (last 3 results) No results found for this basename: PROBNP,  in the last 8760 hours CBG: No results found for this basename: GLUCAP,  in the last 168 hours  Radiological Exams on Admission: No results found.  EKG: Independently reviewed.   Assessment/Plan Principal Problem:   Acute blood loss anemia Active Problems:   Cervical cancer   Vaginal bleeding   38 y.o. 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 Denman George;  -defer management to gyn/onc;   2. Acute blood loss anemia due to vaginal bleeding; no new episode of bleeding after vaginal packing  -recheck Hg in AM; if significant drop, will TF; check iron profile   3. Generalized weakness, fatigue tiredness after chemotherapy -neuro exam no focal; afebrile; no clinical s/s of infection; cont supportive care   DVT prophylaxis: SCD, hold heparin due to active bleeding   Gyn/onc; Donaciano Eva, MD  cell [phone]  if consultant consulted, please document name and whether formally or informally consulted  Code Status: full (must indicate code status--if unknown or must be presumed, indicate so) Family Communication:  D/w patient, her mother, father  (indicate person spoken with, if applicable, with phone number if by telephone) Disposition Plan: home 24-48 hrs  (indicate anticipated LOS)  Time spent: >35 minutes   Kinnie Feil [facility] Pager [phone]  If 7PM-7AM, please contact night-coverage [email or web address] Password [email or web address] 11/09/2013, 4:53 PM

## 2013-11-09 NOTE — Telephone Encounter (Signed)
Lynnae Sandhoff, RN, Jarah's nurse on [address].  Gave her report and advised her that Dr. Denman George said to leave the foley and vaginal packing in until tomorrow.  Kim verbalized agreement and is waiting on orders from the hospitalist.  Also called Stanton Kidney, RN and left a message asking if Dr. Denman George will be putting in orders as well.

## 2013-11-09 NOTE — Telephone Encounter (Signed)
Shalika's mother, Remo Lipps called and said that Shanah is having a lot of vaginal bleeding and can't get it to stop.  Advised Dr. Sondra Come and he would like her to come to the clinic.  Aggie Moats to bring her to the radiation clinic.  Remo Lipps verbalized agreement.

## 2013-11-09 NOTE — Progress Notes (Signed)
Karen Daniels had completed 20 fractions to her pelvis.  She denies pain.  She reports feeling very weak this morning and came in a wheelchair.  She noticed that she started bleeding this morning around 10:15 am.  It started with a clot then she had a steady flow of blood.  She said it is about the time for her to start her period but said the bleeding is much heavier than normal.  She reports the flow of blood will not stop.  She reports having occasional diarrhea.  She reports having nausea and a poor appetite.   She reports that her skin has completely healed except for having a hemorrhoid in her rectal area that itches and burns.  She is taking acyclovir.

## 2013-11-09 NOTE — Progress Notes (Signed)
Consult Note: Gyn-Onc  Consult was requested by Dr. Earney Hamburg for the evaluation of Karen Daniels 38 y.o. 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 Menorrhagia    Assessment/Plan:  [name]  is a 38 y.o.  year old woman who is s/p 23 fractions of external beam radiation with radiosensitizing chemotherapy for stage IB2 adenocarcinoma of the cervix. She is having a modest response to therapy and has a major bleeding episode today eminating from the friable ectocervix.  She has acute blood loss anemia from hemorrhage. She is hemodynamically stable. Given that she is currently receiving radiation, we would like to keep her Hb >10.0, and she will require a blood transfusion if it nadir's below 9.5mg /dL.  She requires transfer to the inpatient hospital unit for observation of bleeding and reassessment of her CBC tonight and tomorrow morning. Her packing and foley should stay in until tomorrow morning at which time they will be removed.  If bleeding persists/restarts despite vaginal packing, she will require examination under anesthesia.  With respect to her modest response to therapy (cervix remains at least 5cm, with visible friale tumor): Dr Sondra Come is considering completing an abreviated course of radiation, with boosts to pelvic nodes, and following this with an extrafascial hysterectomy to reduce the risk of local persistent/recurrence given that complete response appears unlikely at the current response trajectory.   HPI: Spelled when is a 38 year old woman with a history of stage IB 2 adenocarcinoma of the cervix diagnosed in May of 2015. PET scan revealed positive pelvic lymph nodes. She was disposition to receive primary chemoradiation with curative intent. She's been undergoing external beam pelvic RT for the past 5 weeks and has completed  approximate 23 fractions. She is also receiving weekly cisplatin radiosensitizing chemotherapy at 40 mg per meter squared. Her last dose of chemotherapy was yesterday (11/08/2013).  Overall she is tolerating therapy well. 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 This postponed her undergoing radiation fractions.  This morning she began noticing bright red vaginal bleeding had a brisk rate from the vagina. She was also passing large volumes of clots. She denied vaginal penetration including in a coarse, placement of tampons, or any other vaginal activity. Prior to today her bleeding and only been light spotting. She's been bleeding for approximately 10 hours, and is beginning to feel somewhat weak.   Interval History: Dr. Sondra Come a radiation oncologist had placed packing in the vagina at approximately noon however she search through this packing. Her hemoglobin yesterday was 11.9. Today it is 9.9.  Current Meds:  Outpatient Encounter Prescriptions as of 11/09/2013  Medication Sig  . acyclovir (ZOVIRAX) 400 MG tablet Take 1 tablet (400 mg total) by mouth 3 (three) times daily.  Marland Kitchen acyclovir (ZOVIRAX) 400 MG tablet Take 1 tablet (400 mg total) by mouth 3 (three) times daily.  Marland Kitchen acyclovir (ZOVIRAX) 400 MG tablet Take 1 tablet (400 mg total) by mouth 2 (two) times daily. Begin after 10 day full course dose for suppression of lesions for 1-2 months beyond completition of chemotherapy/RT.  Marland Kitchen loperamide (IMODIUM A-D) 2 MG tablet Take 2 mg by mouth 4 (four) times daily as needed for diarrhea or loose stools.  Marland Kitchen LORazepam (ATIVAN) 0.5 MG tablet Take 1 -2 tablets by mouth or under the tongue every 6 hours as needed for nausea.  Marland Kitchen  Norgestimate-Ethinyl Estradiol Triphasic 0.18/0.215/0.25 MG-25 MCG tab Take 1 tablet by mouth every morning.  . ondansetron (ZOFRAN) 8 MG tablet Take one tablet every 8 hours as needed for nausea. Start the 3rd day after chemotherapy.  . pantoprazole (PROTONIX) 40  MG tablet Take 1 tablet (40 mg total) by mouth daily.    Allergy: No Known Allergies  Social Hx:   History   Social History  . Marital Status: Divorced    Spouse Name: N/A    Number of Children: 2  . Years of Education: N/A   Occupational History  .     Social History Main Topics  . Smoking status: Former Smoker -- 0.50 packs/day for 2 years    Types: Cigarettes    Quit date: 08/30/2013  . Smokeless tobacco: Never Used  . Alcohol Use: Yes     Comment: occasionally  . Drug Use: No  . Sexual Activity: Yes   Other Topics Concern  . Not on file   Social History Narrative  . No narrative on file    Past Surgical Hx:  Past Surgical History  Procedure Laterality Date  . Wisdom tooth extraction    . Colposcopy    . Diagnostic laparoscopy  07-28-2000    w/ lysis adhesions  . Colonoscopy  07-11-1999    Past Medical Hx:  Past Medical History  Diagnosis Date  . 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 Anxiety   . Depression   . Adenocarcinoma of cervix, stage 1     dx  may 2015   STAGE I B2 (+pet scan pelvic nodes for mets)---  oncologist--  dr Marko Plume and dr Sondra Come--  chemo and external radiation    . Blister of genital area   . Itching in the vaginal area     Past Gynecological History:  Stage IB2 cervical cancer  Patient's last menstrual period was 10/15/2013.  Family Hx:  Family History  Problem Relation Age of Onset  . Heart disease Father   . COPD Father   . Diabetes Father   . Hypertension Father   . Arthritis Father   . Arthritis Mother   . Breast cancer Maternal Aunt   . Diabetes Maternal Grandmother   . Cancer Maternal Grandmother     skin cancer  . Heart disease Maternal Grandmother   . Breast cancer Paternal Grandmother   . Cancer Maternal Aunt     lung    Review of Systems:  Constitutional  Feels shaky and weak.  ENT Normal appearing ears and nares bilaterally Skin/Breast  No rash, sores, jaundice, itching,  dryness Cardiovascular  No chest pain, shortness of breath, or edema  Pulmonary  No cough or wheeze.  Gastro Intestinal  No nausea, vomitting, or diarrhoea. No bright red blood per rectum, no abdominal pain, change in bowel movement, or constipation.  Genito Urinary  No frequency, urgency, dysuria. + vaginal bleeding, + vulvar irritation Musculo Skeletal  No myalgia, arthralgia, joint swelling or pain  Neurologic  No weakness, numbness, change in gait,  Psychology  No depression, anxiety, insomnia.   Vitals:  Last menstrual period 10/15/2013.  Physical Exam: WD in NAD Neck  Supple NROM, without any enlargements.  Lymph Node Survey No cervical supraclavicular or inguinal adenopathy Cardiovascular  Pulse normal rate, regularity and rhythm. S1 and S2 normal.  Lungs  Clear to auscultation bilateraly, without wheezes/crackles/rhonchi. Good air movement.  Skin  No rash/lesions/breakdown  Psychiatry  Alert and oriented to person, place, and time  Abdomen  Normoactive bowel sounds, abdomen soft, non-tender and obese without evidence of hernia. Back No CVA tenderness Genito Urinary  Vulva/vagina: herpetic lesions on labia minor  Bladder/urethra:  No lesions or masses, well supported bladder  Vagina: normal  Cervix: 5cm, bulky, large, friable, spontaneous trickle of blood from friable cervix.  Uterus: deferred.  Adnexa: deferred. Rectal  Deferred. Extremities  No bilateral cyanosis, clubbing or edema.  Procedure: placement of vaginal packing and foley catheter. The patient was placed in lithotomy position in stirrups. Speculum was placed vaginally and apparently 100 cc of clot was removed from the vaginal fornix. The cervix was well well visualized and noted to be friable with low-volume bleeding from its surface. Monsel solution was applied to the surface of the cervix. A pressure was held for approximately minutes. After removal of pressure hemostasis was observed across the  cervix with no active bleeding noted. A Kerlix sponge was moistened and tightly packed within the vagina to apply pressure to the cervix. A single Kerlix sponge resides vaginally. The perineum was prepped with Betadine in 39 French Foley catheter was inserted sterilely into the urinary bladder and secured. The patient tolerated the procedure well with no active bleeding completion of the procedure.   Donaciano Eva, MD  cell [phone]  11/09/2013, 3:27 PM

## 2013-11-09 NOTE — Progress Notes (Signed)
Patient having burning and pressure in her vaginal area due to foley catheter and vaginal packing.  Rating at a 10/10.  Order received for 1 percocet per Dr. Sondra Come.  Patient refused percocet because she said it makes her sick to her stomach when she has not eaten.

## 2013-11-09 NOTE — Progress Notes (Signed)
  Radiation Oncology         [phone] ________________________________  Name: Karen Daniels MRN: [id number]  Date: 11/09/2013  DOB: [birth year]  Weekly Radiation Therapy Management  DIAGNOSIS: Clinical stage IB-2 adenocarcinoma the cervix with PET positive lymph nodes in the pelvis region   Current Dose: 36 Gy     Planned Dose:  54+ Gy  Narrative . . . . . . . . The patient presents prior to her treatment today. Earlier in the day the patient developed significant vaginal bleeding. I asked the patient come in early for examination.. Patient had significant clots in the vaginal vault and continued significant vaginal bleeding. I  placed vaginal packing in the area which soaked quickly due  to the vaginal bleeding.  Gynecologic oncology was consulted and graciously evaluated the patient in an expedited fashion.  Patient had evacuation of the vaginal clots and placement of moncells along the bleeding area of the cervix.                                                              Physical Findings. . .  oral temperature is 97.4 F (36.3 C). Her blood pressure is 106/69 and her pulse is 78. Marland Kitchen   Plan . . . . . . . . . . . Marland Kitchen the patient will be admitted to the hospital overnight with instructions from gynecologic oncology as above.  ________________________________   Blair Promise, PhD, MD

## 2013-11-10 ENCOUNTER — Telehealth: Payer: Self-pay | Admitting: Oncology

## 2013-11-10 ENCOUNTER — Ambulatory Visit
Admission: RE | Admit: 2013-11-10 | Discharge: 2013-11-10 | Disposition: A | Discharge status: Home / Self Care | Payer: Medicaid Other | Source: Ambulatory Visit | Attending: Radiation Oncology | Admitting: Radiation Oncology

## 2013-11-10 DIAGNOSIS — D62 Acute posthemorrhagic anemia: Secondary | ICD-10-CM | POA: Diagnosis not present

## 2013-11-10 LAB — CBC
HCT: 25.1 % — ABNORMAL LOW (ref 36.0–46.0)
HEMOGLOBIN: 8.6 g/dL — AB (ref 12.0–15.0)
MCH: 31 pg (ref 26.0–34.0)
MCHC: 34.3 g/dL (ref 30.0–36.0)
MCV: 90.6 fL (ref 78.0–100.0)
Platelets: 153 10*3/uL (ref 150–400)
RBC: 2.77 MIL/uL — AB (ref 3.87–5.11)
RDW: 14.2 % (ref 11.5–15.5)
WBC: 4.4 10*3/uL (ref 4.0–10.5)

## 2013-11-10 LAB — PREPARE RBC (CROSSMATCH)

## 2013-11-10 LAB — ABO/RH: ABO/RH(D): O POS

## 2013-11-10 LAB — FERRITIN: Ferritin: 252 ng/mL (ref 10–291)

## 2013-11-10 MED ORDER — NORGESTIM-ETH ESTRAD TRIPHASIC 0.18/0.215/0.25 MG-25 MCG PO TABS: 1.0000 | ORAL_TABLET | Freq: Every morning | ORAL | Status: DC

## 2013-11-10 NOTE — Progress Notes (Signed)
[facility] PROGRESS NOTE  Karen Daniels WPY:[id number] DOB: February 22, 1976 DOA: 11/09/2013 PCP: No PCP Per Patient  Assessment/Plan: Acute blood loss anemia -cervical cancer complicated with heavy vaginal bleeding , stable after packing done by gyn onc. Required 1U prbc this am for drop in H&h -packing removed with minimal blood on packing. Foley removed. -recheck h&H in am .   cervical ca adenocarcinoma of the cervix metastatic to pelvic lymph nodes On chemotherapy and radiation. Received radiation therapy today.   Generalized weakness Supportive care with fluids.   D/c home in am if stable    Code Status: full Family Communication: husband at bedside Disposition Plan: home in am    Consultants:  Gyn oncology  Procedures:  none  Antibiotics:  none  HPI/Subjective: Seen and examined. Reports Minimal vaginal bleeding. Feels very tired  Objective: Filed Vitals:   11/10/13 1530  BP: 103/56  Pulse: 87  Temp: 97.8 F (36.6 C)  Resp: 18    Intake/Output Summary (Last 24 hours) at 11/10/13 1734 Last data filed at 11/10/13 1631  Gross per 24 hour  Intake   1435 ml  Output   2075 ml  Net   -640 ml   Filed Weights   11/09/13 1632  Weight: 66.2 kg (145 lb 15.1 oz)    Exam:   General: middle aged female lying in bed appears fatigued  HEENT: pallor+, moist mucosa  Cardiovascular: NS1&S2, no murmurs  Respiratory: clear b/l  Abdomen: soft, NT, ND, BS+, no  vaginal bleeding  Musculoskeletal: Warm, no edema  Data Reviewed: Basic Metabolic Panel:  Recent Labs Lab 11/08/13 0856 11/08/13 0857 11/09/13 1327  NA  --  137 135*  K  --  4.0 4.4  CO2  --  24 26  GLUCOSE  --  117 90  BUN  --  13.1 13.9  CREATININE  --  0.8 0.7  CALCIUM  --  9.2 8.6  MG 1.9  --   --    Liver Function Tests:  Recent Labs Lab 11/08/13 0857  AST 13  ALT 10  ALKPHOS 80  BILITOT 0.39  PROT 6.9  ALBUMIN 3.4*   No results found for this basename: LIPASE,  AMYLASE,  in the last 168 hours No results found for this basename: AMMONIA,  in the last 168 hours CBC:  Recent Labs Lab 11/08/13 0856 11/09/13 1326 11/10/13 0410  WBC 5.4 5.5 4.4  NEUTROABS 4.6 4.6  --   HGB 11.9 9.9* 8.6*  HCT 35.2 29.7* 25.1*  MCV 92.3 91.7 90.6  PLT 217 197 153   Cardiac Enzymes: No results found for this basename: CKTOTAL, CKMB, CKMBINDEX, TROPONINI,  in the last 168 hours BNP (last 3 results) No results found for this basename: PROBNP,  in the last 8760 hours CBG: No results found for this basename: GLUCAP,  in the last 168 hours  No results found for this or any previous visit (from the past 240 hour(s)).   Studies: No results found.  Scheduled Meds: . acyclovir  400 mg Oral BID  . Norgestimate-Ethinyl Estradiol Triphasic  1 tablet Oral q morning - 10a  . pantoprazole  40 mg Oral Daily   Continuous Infusions: . sodium chloride 75 mL/hr at 11/10/13 0640      Time spent: 25 minutes    Kirbie Stodghill  [facility] Pager [phone] If 7PM-7AM, please contact night-coverage at [email or web address], password [facility] 11/10/2013, 5:34 PM  LOS: 1 day

## 2013-11-10 NOTE — Progress Notes (Signed)
[facility] Therapy Treatment Record Phone [phone]   Radiation Therapy was administered to Karen Daniels on: 11/10/2013  11:06 AM and was treatment # 21 out of a planned course of 25 treatments.

## 2013-11-10 NOTE — Progress Notes (Signed)
Utilization Review Completed.Karen Daniels T7/29/2015  

## 2013-11-10 NOTE — Progress Notes (Signed)
Patient ID: Karen Daniels, female   DOB: 03-15-1976, 38 y.o.   MRN: [id number] Subjective: Interval History: no complaints  Objective: Vital signs in last 24 hours: Temp:  [97.4 F (36.3 C)-98.6 F (37 C)] 98.6 F (37 C) (07/29 0610) Pulse Rate:  [75-86] 75 (07/29 0610) Resp:  [16-18] 18 (07/29 0610) BP: (96-106)/(57-76) 106/75 mmHg (07/29 0610) SpO2:  [97 %-100 %] 100 % (07/29 0610) Weight:  [66.2 kg (145 lb 15.1 oz)] 66.2 kg (145 lb 15.1 oz) (07/28 1632)  Intake/Output from previous day: 07/28 0701 - 07/29 0700 In: 860 [I.V.:860] Out: 500 [Urine:500] Intake/Output this shift:    Pelvic: packing removed; minimal blood on packing Ext: NT Results for orders placed during the hospital encounter of 11/09/13 (from the past 24 hour(s))  IRON AND TIBC     Status: Abnormal   Collection Time    11/09/13  7:12 PM      Result Value Ref Range   Iron 88  42 - 135 ug/dL   TIBC 230 (*) 250 - 470 ug/dL   Saturation Ratios 38  20 - 55 %   UIBC 142  125 - 400 ug/dL  FERRITIN     Status: None   Collection Time    11/09/13  7:12 PM      Result Value Ref Range   Ferritin 252  10 - 291 ng/mL  CBC     Status: Abnormal   Collection Time    11/10/13  4:10 AM      Result Value Ref Range   WBC 4.4  4.0 - 10.5 K/uL   RBC 2.77 (*) 3.87 - 5.11 MIL/uL   Hemoglobin 8.6 (*) 12.0 - 15.0 g/dL   HCT 25.1 (*) 36.0 - 46.0 %   MCV 90.6  78.0 - 100.0 fL   MCH 31.0  26.0 - 34.0 pg   MCHC 34.3  30.0 - 36.0 g/dL   RDW 14.2  11.5 - 15.5 %   Platelets 153  150 - 400 K/uL  PREPARE RBC (CROSSMATCH)     Status: None   Collection Time    11/10/13  7:30 AM      Result Value Ref Range   Order Confirmation ORDER PROCESSED BY BLOOD BANK      Studies/Results: No results found.  Scheduled Meds: . acyclovir  400 mg Oral BID  . Norgestimate-Ethinyl Estradiol Triphasic  1 tablet Oral q morning - 10a  . pantoprazole  40 mg Oral Daily   Continuous Infusions: . sodium chloride 75 mL/hr at 11/10/13 0640    PRN Meds:acetaminophen, acetaminophen, loperamide, LORazepam, ondansetron (ZOFRAN) IV, ondansetron, oxyCODONE-acetaminophen  Assessment/Plan: Principal Problem:   Acute blood loss anemia Active Problems:   Cervical cancer   Vaginal bleeding   Anemia  Acute bleed controlled at present, hemodynamically stable  -->monitor PV loss; if bleeding minimal-->d/c foley; consider d/c home later today -->transfuse 1 unit PRBC   LOS: 1 day   JACKSON-MOORE,Aztlan Coll A

## 2013-11-10 NOTE — Telephone Encounter (Signed)
Karen Sandhoff, RN on [address] for report.  Per Karen Daniels has had minimal bleeding since her vaginal packing was removed this morning.  She will be receiving one unit of blood today.  Advised her that Dr. Sondra Come would like the patient to have radiation today and the treatment time will be at 12:00.  Karen Daniels verbalized that this will be OK.

## 2013-11-11 ENCOUNTER — Ambulatory Visit
Admission: RE | Admit: 2013-11-11 | Discharge: 2013-11-11 | Disposition: A | Discharge status: Home / Self Care | Payer: Medicaid Other | Source: Ambulatory Visit | Attending: Radiation Oncology | Admitting: Radiation Oncology

## 2013-11-11 ENCOUNTER — Ambulatory Visit: Payer: Medicaid Other | Admitting: Gynecologic Oncology

## 2013-11-11 ENCOUNTER — Encounter: Payer: Self-pay | Admitting: Radiation Oncology

## 2013-11-11 DIAGNOSIS — D62 Acute posthemorrhagic anemia: Secondary | ICD-10-CM | POA: Diagnosis not present

## 2013-11-11 DIAGNOSIS — C539 Malignant neoplasm of cervix uteri, unspecified: Secondary | ICD-10-CM

## 2013-11-11 LAB — HEMOGLOBIN AND HEMATOCRIT, BLOOD
HCT: 28.1 % — ABNORMAL LOW (ref 36.0–46.0)
Hemoglobin: 9.7 g/dL — ABNORMAL LOW (ref 12.0–15.0)

## 2013-11-11 LAB — TYPE AND SCREEN
ABO/RH(D): O POS
ANTIBODY SCREEN: NEGATIVE
Unit division: 0

## 2013-11-11 NOTE — Discharge Instructions (Signed)

## 2013-11-11 NOTE — Progress Notes (Signed)
[facility] Therapy Treatment Record Phone [phone]   Radiation Therapy was administered to Karen Daniels on: 11/11/2013  11:23 AM and was treatment # 22 out of a planned course of 25 treatments.

## 2013-11-11 NOTE — Progress Notes (Signed)
Patient discharged to home with family, discharge instructions reviewed with patient who verbalized understanding. No new RX's for patient. 

## 2013-11-11 NOTE — Discharge Summary (Signed)
Physician Discharge Summary  Karen Daniels GBT:[id number] DOB: 1976/03/13 DOA: 11/09/2013  PCP: No PCP Per Patient  Admit date: 11/09/2013 Discharge date: 11/11/2013  Time spent: 25 minutes  Recommendations for Outpatient Follow-up:  1. D/c home with outpt oncology and radiation onc follow up  Discharge Diagnoses:  Principal Problem:   Acute blood loss anemia  Active Problems:   Cervical cancer   Vaginal bleeding    Discharge Condition:fair  Diet recommendation: regular  Filed Weights   11/09/13 1632  Weight: 66.2 kg (145 lb 15.1 oz)    History of present illness:   38 y.o. female with cervical CA (adenocarcinoma of the cervix metastatic to pelvic lymph nodes) Presented with generalized weakness, fatigue tiredness after chemotherapy; She also reported heavy intermittent vaginal bleeding which stopped after vaginal packing placed by Dr. Denman George ( gyn onc) office.  Patient denies chest pain, no SOB, had mild nausea, no vomiting, no abdominal pain, no diarrhea; no focal neurological  symptoms; she was afebrile but reported some chills . -Hospitalist asked for evaluation adn observation for anemia/bleeding  Hb on admission was 9.9   Hospital Course:  Acute blood loss anemia  -secondary to cervical cancer complicated with heavy vaginal bleeding , stable after packing done by gyn onc.  Required 1U prbc on 7/29  for drop in H&h and improved to 9.7 this am. -minimal blood on packing. Foley removed.  -pt feels better overall and can be dsicahrged home with follow up as outpt  cervical ca  adenocarcinoma of the cervix metastatic to pelvic lymph nodes  On chemotherapy and radiation.   Generalized weakness  Supportive care given with fluids.      Procedures:  none  Consultations:  Gyn -onc  Discharge Exam: Filed Vitals:   11/11/13 0606  BP: 105/60  Pulse: 79  Temp: 97.5 F (36.4 C)  Resp: 20    General: middle aged female lying in bed in NAD  HEENT:  pallor+, moist mucosa  Cardiovascular: NS1&S2, no murmurs  Respiratory: clear b/l  Abdomen: soft, NT, ND, BS+, no no bleeding on the pad  Musculoskeletal: Warm, no edema   Discharge Instructions You were cared for by a hospitalist during your hospital stay. If you have any questions about your discharge medications or the care you received while you were in the hospital after you are discharged, you can call the unit and asked to speak with the hospitalist on call if the hospitalist that took care of you is not available. Once you are discharged, your primary care physician will handle any further medical issues. Please note that NO REFILLS for any discharge medications will be authorized once you are discharged, as it is imperative that you return to your primary care physician (or establish a relationship with a primary care physician if you do not have one) for your aftercare needs so that they can reassess your need for medications and monitor your lab values.     Medication List         acyclovir 400 MG tablet  Commonly known as:  ZOVIRAX  Take 1 tablet (400 mg total) by mouth 2 (two) times daily. Begin after 10 day full course dose for suppression of lesions for 1-2 months beyond completition of chemotherapy/RT.     IMODIUM A-D 2 MG tablet  Generic drug:  loperamide  Take 2 mg by mouth 4 (four) times daily as needed for diarrhea or loose stools.     LORazepam 0.5 MG tablet  Commonly known  as:  ATIVAN  Take 1 -2 tablets by mouth or under the tongue every 6 hours as needed for nausea.     Norgestimate-Ethinyl Estradiol Triphasic 0.18/0.215/0.25 MG-25 MCG tab  Take 1 tablet by mouth every morning.     ondansetron 8 MG tablet  Commonly known as:  ZOFRAN  Take one tablet every 8 hours as needed for nausea. Start the 3rd day after chemotherapy.     pantoprazole 40 MG tablet  Commonly known as:  PROTONIX  Take 1 tablet (40 mg total) by mouth daily.       No Known Allergies      Follow-up Information   Please follow up. (has appt with Dr Marko Plume and radiation onc as outpt)        The results of significant diagnostics from this hospitalization (including imaging, microbiology, ancillary and laboratory) are listed below for reference.    Significant Diagnostic Studies: No results found.  Microbiology: No results found for this or any previous visit (from the past 240 hour(s)).   Labs: Basic Metabolic Panel:  Recent Labs Lab 11/08/13 0856 11/08/13 0857 11/09/13 1327  NA  --  137 135*  K  --  4.0 4.4  CO2  --  24 26  GLUCOSE  --  117 90  BUN  --  13.1 13.9  CREATININE  --  0.8 0.7  CALCIUM  --  9.2 8.6  MG 1.9  --   --    Liver Function Tests:  Recent Labs Lab 11/08/13 0857  AST 13  ALT 10  ALKPHOS 80  BILITOT 0.39  PROT 6.9  ALBUMIN 3.4*   No results found for this basename: LIPASE, AMYLASE,  in the last 168 hours No results found for this basename: AMMONIA,  in the last 168 hours CBC:  Recent Labs Lab 11/08/13 0856 11/09/13 1326 11/10/13 0410 11/11/13 0730  WBC 5.4 5.5 4.4  --   NEUTROABS 4.6 4.6  --   --   HGB 11.9 9.9* 8.6* 9.7*  HCT 35.2 29.7* 25.1* 28.1*  MCV 92.3 91.7 90.6  --   PLT 217 197 153  --    Cardiac Enzymes: No results found for this basename: CKTOTAL, CKMB, CKMBINDEX, TROPONINI,  in the last 168 hours BNP: BNP (last 3 results) No results found for this basename: PROBNP,  in the last 8760 hours CBG: No results found for this basename: GLUCAP,  in the last 168 hours     Signed:  Arvella Massingale, [place]  [facility] 11/11/2013, 9:04 AM

## 2013-11-11 NOTE — Progress Notes (Signed)
  Radiation Oncology         [phone] ________________________________  Name: Karen Daniels MRN: [id number]  Date: 11/11/2013  DOB: April 21, 1975  Weekly Radiation Therapy Management - Inpatient  DIAGNOSIS: Clinical stage IB-2 adenocarcinoma the cervix with PET positive lymph nodes in the pelvis region   Current Dose: 39.6 Gy     Planned Dose:  54 + Gy  Narrative . . . . . . . . The patient presents for routine under treatment assessment.                                   She is feeling better after receiving a transfusion. She continues to have a lot of fatigue but has been able to stand and brush her teeth this morning. She has very minimal bleeding at this time                                 Set-up films were reviewed.                                 The chart was checked. Physical Findings. . . the patient's color is much better today. She looks more rested. The lungs are clear. The heart has a regular rhythm and rate. The abdomen is soft and nontender with normal bowel sounds. Impression . . . . . . . The patient is tolerating radiation. Plan . . . . . . . . . . . . Continue treatment as planned.  The patient will be discharged from the hospital later today. She will continue her external beam radiation therapy as an outpatient tomorrow. Patient has 3 more treatments remaining to complete her pelvic radiation therapy. The patient will then proceed with her nodal boost which will begin next week (5 treatments). Patient will tentatively receive additional radiosensitizing chemotherapy on August 3 which will likely be her last cycle of radiosensitizing chemotherapy. I have tentatively scheduled the patient for her first brachytherapy procedure on August 11.  If there has been minimal response to her external beam and radiosensitizing chemotherapy,  then patient might be better served undergoing a extrafascial hysterectomy after this one brachytherapy  procedure  ________________________________   Blair Promise, PhD, MD

## 2013-11-11 NOTE — Progress Notes (Signed)
Karen Daniels has completed 22 fractions to her pelvis.  She reports pain with urination. She thinks she is irritated from the packing.  She reports feeling fatigue.  She does feel better after her blood transfusion yesterday.  She reports seeing a small amount of bleeding after she urinates.  She is being discharged today.

## 2013-11-11 NOTE — Progress Notes (Signed)
   Department of Radiation Oncology  Phone:  [phone] Fax:        [phone]  Simulation note  Today the patient underwent additional planning for radiation therapy directed at the pelvis area. Patient's treatment planning CT scan was reviewed as well as her recent PET scan. She had outlining of the PET positive nodes in the pelvis region. Patient then had outlining of a planning target volume to encompass these lymph nodes. The patient then had set up of a intensity modulated radiation therapy plan covering these 2 lymph node areas. The patient will be treated with 2 rapid arcs. 6 MV photons will be used to deliver the patient's treatment. The patient will receive 5 additional treatments at 1.8 gray per day for a boost dose of 9 gray directed at these lymph node areas.  Blair Promise,  M.D.

## 2013-11-12 ENCOUNTER — Ambulatory Visit
Admission: RE | Admit: 2013-11-12 | Discharge: 2013-11-12 | Disposition: A | Discharge status: Home / Self Care | Payer: Medicaid Other | Source: Ambulatory Visit | Attending: Radiation Oncology | Admitting: Radiation Oncology

## 2013-11-12 DIAGNOSIS — Z510 Encounter for antineoplastic radiation therapy: Secondary | ICD-10-CM | POA: Diagnosis not present

## 2013-11-15 ENCOUNTER — Other Ambulatory Visit: Payer: Self-pay

## 2013-11-15 ENCOUNTER — Ambulatory Visit
Admission: RE | Admit: 2013-11-15 | Discharge: 2013-11-15 | Disposition: A | Discharge status: Home / Self Care | Payer: Medicaid Other | Source: Ambulatory Visit | Attending: Radiation Oncology | Admitting: Radiation Oncology

## 2013-11-15 ENCOUNTER — Encounter: Payer: Self-pay | Admitting: Gynecologic Oncology

## 2013-11-15 ENCOUNTER — Telehealth: Payer: Self-pay | Admitting: *Deleted

## 2013-11-15 ENCOUNTER — Ambulatory Visit: Payer: Medicaid Other | Attending: Gynecologic Oncology | Admitting: Gynecologic Oncology

## 2013-11-15 ENCOUNTER — Ambulatory Visit: Payer: Medicaid Other | Admitting: Nutrition

## 2013-11-15 ENCOUNTER — Ambulatory Visit (HOSPITAL_BASED_OUTPATIENT_CLINIC_OR_DEPARTMENT_OTHER): Payer: Medicaid Other

## 2013-11-15 ENCOUNTER — Other Ambulatory Visit: Payer: Self-pay | Admitting: Gynecologic Oncology

## 2013-11-15 ENCOUNTER — Ambulatory Visit (HOSPITAL_COMMUNITY)
Admission: RE | Admit: 2013-11-15 | Discharge: 2013-11-15 | Disposition: A | Discharge status: Home / Self Care | Payer: Medicaid Other | Source: Ambulatory Visit | Attending: Gynecologic Oncology | Admitting: Gynecologic Oncology

## 2013-11-15 VITALS — BP 133/67 | HR 83 | Temp 98.3°F | Resp 20

## 2013-11-15 DIAGNOSIS — C539 Malignant neoplasm of cervix uteri, unspecified: Secondary | ICD-10-CM

## 2013-11-15 DIAGNOSIS — C530 Malignant neoplasm of endocervix: Secondary | ICD-10-CM

## 2013-11-15 DIAGNOSIS — N939 Abnormal uterine and vaginal bleeding, unspecified: Secondary | ICD-10-CM

## 2013-11-15 DIAGNOSIS — D649 Anemia, unspecified: Secondary | ICD-10-CM | POA: Diagnosis not present

## 2013-11-15 DIAGNOSIS — N898 Other specified noninflammatory disorders of vagina: Secondary | ICD-10-CM

## 2013-11-15 DIAGNOSIS — Z510 Encounter for antineoplastic radiation therapy: Secondary | ICD-10-CM | POA: Diagnosis not present

## 2013-11-15 DIAGNOSIS — L539 Erythematous condition, unspecified: Secondary | ICD-10-CM

## 2013-11-15 LAB — CBC
HEMATOCRIT: 22.8 % — AB (ref 34.8–46.6)
HEMOGLOBIN: 7.6 g/dL — AB (ref 11.6–15.9)
MCH: 29.9 pg (ref 25.1–34.0)
MCHC: 33.4 g/dL (ref 31.5–36.0)
MCV: 89.4 fL (ref 79.5–101.0)
PLATELETS: 148 10*3/uL (ref 145–400)
RBC: 2.55 10*6/uL — ABNORMAL LOW (ref 3.70–5.45)
RDW: 15.7 % — ABNORMAL HIGH (ref 11.2–14.5)
WBC: 3.8 10*3/uL — ABNORMAL LOW (ref 3.9–10.3)

## 2013-11-15 LAB — COMPREHENSIVE METABOLIC PANEL (CC13)
ALBUMIN: 3.2 g/dL — AB (ref 3.5–5.0)
ALT: 7 U/L (ref 0–55)
ANION GAP: 8 meq/L (ref 3–11)
AST: 14 U/L (ref 5–34)
Alkaline Phosphatase: 59 U/L (ref 40–150)
BUN: 12.3 mg/dL (ref 7.0–26.0)
CALCIUM: 9.1 mg/dL (ref 8.4–10.4)
CO2: 27 meq/L (ref 22–29)
Chloride: 105 mEq/L (ref 98–109)
Creatinine: 0.7 mg/dL (ref 0.6–1.1)
Glucose: 93 mg/dl (ref 70–140)
POTASSIUM: 3.6 meq/L (ref 3.5–5.1)
SODIUM: 140 meq/L (ref 136–145)
TOTAL PROTEIN: 6.4 g/dL (ref 6.4–8.3)
Total Bilirubin: 0.3 mg/dL (ref 0.20–1.20)

## 2013-11-15 LAB — CBC WITH DIFFERENTIAL/PLATELET
BASO%: 0.4 % (ref 0.0–2.0)
Basophils Absolute: 0 10*3/uL (ref 0.0–0.1)
EOS%: 0.6 % (ref 0.0–7.0)
Eosinophils Absolute: 0 10*3/uL (ref 0.0–0.5)
HEMATOCRIT: 27.5 % — AB (ref 34.8–46.6)
HEMOGLOBIN: 9.2 g/dL — AB (ref 11.6–15.9)
LYMPH#: 0.3 10*3/uL — AB (ref 0.9–3.3)
LYMPH%: 7 % — ABNORMAL LOW (ref 14.0–49.7)
MCH: 30.2 pg (ref 25.1–34.0)
MCHC: 33.5 g/dL (ref 31.5–36.0)
MCV: 90.2 fL (ref 79.5–101.0)
MONO#: 0.3 10*3/uL (ref 0.1–0.9)
MONO%: 8.4 % (ref 0.0–14.0)
NEUT#: 3.1 10*3/uL (ref 1.5–6.5)
NEUT%: 83.6 % — ABNORMAL HIGH (ref 38.4–76.8)
Platelets: 167 10*3/uL (ref 145–400)
RBC: 3.05 10*6/uL — ABNORMAL LOW (ref 3.70–5.45)
RDW: 15.3 % — ABNORMAL HIGH (ref 11.2–14.5)
WBC: 3.7 10*3/uL — AB (ref 3.9–10.3)

## 2013-11-15 LAB — PREPARE RBC (CROSSMATCH)

## 2013-11-15 LAB — MAGNESIUM (CC13): Magnesium: 1.9 mg/dl (ref 1.5–2.5)

## 2013-11-15 MED ORDER — SODIUM CHLORIDE 0.9 % IV SOLN
Freq: Once | INTRAVENOUS | Status: AC
  Administered 2013-11-15: 10:00:00 via INTRAVENOUS

## 2013-11-15 MED ORDER — LORAZEPAM 1 MG PO TABS: 0.5000 mg | ORAL_TABLET | Freq: Once | ORAL | Status: DC | PRN

## 2013-11-15 MED ORDER — PALONOSETRON HCL INJECTION 0.25 MG/5ML
INTRAVENOUS | Status: AC
  Filled 2013-11-15: qty 5

## 2013-11-15 MED ORDER — POTASSIUM CHLORIDE 2 MEQ/ML IV SOLN
Freq: Once | INTRAVENOUS | Status: AC
  Administered 2013-11-15: 10:00:00 via INTRAVENOUS
  Filled 2013-11-15: qty 10

## 2013-11-15 MED ORDER — LORAZEPAM 0.5 MG PO TABS: ORAL_TABLET | ORAL | Status: DC

## 2013-11-15 MED ORDER — SODIUM CHLORIDE 0.9 % IV SOLN
40.0000 mg/m2 | Freq: Once | INTRAVENOUS | Status: DC
  Filled 2013-11-15: qty 70

## 2013-11-15 MED ORDER — PALONOSETRON HCL INJECTION 0.25 MG/5ML
0.2500 mg | Freq: Once | INTRAVENOUS | Status: AC
  Administered 2013-11-15: 0.25 mg via INTRAVENOUS

## 2013-11-15 MED ORDER — SODIUM CHLORIDE 0.9 % IV SOLN
150.0000 mg | Freq: Once | INTRAVENOUS | Status: AC
  Administered 2013-11-15: 150 mg via INTRAVENOUS
  Filled 2013-11-15: qty 5

## 2013-11-15 NOTE — Progress Notes (Signed)
Gyn-Onc Followup   Karen Daniels 38 y.o. 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 Vaginal Bleeding    Assessment/Plan:  Karen Daniels  is a 38 y.o.  year old woman who is s/p 25 fractions of external beam radiation with radiosensitizing chemotherapy for stage IB2 adenocarcinoma of the cervix. She is having a modest response to therapy and has a 2nd major bleeding episode today eminating from the friable ectocervix.  She has acute blood loss anemia from hemorrhage. She is hemodynamically stable. Hb nadired at 7.6 today (down from 9.2). Given that she is receiving radiation we will transfuse her 2 units PRBC tomorrow. Consent obtained for this.  Bleeding stopped now with pressure and application of monsel's. Observation for 2 hours confirmed no major active bleeding. Counseled patient to avoid tub baths, and strict pelvic rest. Will plan for her to have cisplatin chemosens later this week (Thurs or Friday) if she has had no new bleeding episodes.   With respect to her modest response to therapy (cervix remains at least 5cm, with visible friale tumor): Dr Sondra Come is considering completing an abreviated course of radiation, with boosts to pelvic nodes, and following this with an extrafascial hysterectomy to reduce the risk of local persistent/recurrence given that complete response appears unlikely at the current response trajectory.   HPI: Spelled when is a 38 year old woman with a history of stage IB 2 adenocarcinoma of the cervix diagnosed in May of 2015. PET scan revealed positive pelvic lymph nodes. She was disposition to receive primary chemoradiation with curative intent. She's been undergoing external beam pelvic RT for the past 5 weeks and has completed approximate 25 fractions. She is also receiving weekly cisplatin radiosensitizing chemotherapy at  40 mg per meter squared. Her last dose of chemotherapy was 11/08/2013.  Overall she is tolerating therapy well. 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 This postponed her undergoing radiation fractions.  Last week she was admitted to the hospital on 11/09/13 for heavy vaginal bleeding requiring packing and transfusion.  This morning while she was at chemotherapy she began noticing bright red vaginal bleeding had a brisk rate from the vagina. She was also passing large volumes of clots. She denied vaginal penetration including in a coarse, placement of tampons, or any other vaginal activity. Prior to today her bleeding and only been light spotting. She's been bleeding for approximately 3 hours, and is beginning to feel somewhat weak.   Interval History: mild bleeding in past week since discharge.  Current Meds:  Outpatient Encounter Prescriptions as of 11/15/2013  Medication Sig  . acyclovir (ZOVIRAX) 400 MG tablet Take 1 tablet (400 mg total) by mouth 2 (two) times daily. Begin after 10 day full course dose for suppression of lesions for 1-2 months beyond completition of chemotherapy/RT.  Marland Kitchen loperamide (IMODIUM A-D) 2 MG tablet Take 2 mg by mouth 4 (four) times daily as needed for diarrhea or loose stools.  Marland Kitchen LORazepam (ATIVAN) 0.5 MG tablet Take 1 -2 tablets by mouth or under the tongue every 6 hours as needed for nausea.  . Norgestimate-Ethinyl Estradiol Triphasic 0.18/0.215/0.25 MG-25 MCG tab Take 1 tablet by mouth every morning.  . ondansetron (ZOFRAN) 8 MG tablet Take one tablet every 8 hours as needed for nausea. Start the 3rd day after chemotherapy.  . pantoprazole (PROTONIX) 40 MG tablet Take  1 tablet (40 mg total) by mouth daily.  . [DISCONTINUED] LORazepam (ATIVAN) 0.5 MG tablet Take 1 -2 tablets by mouth or under the tongue every 6 hours as needed for nausea.  . [DISCONTINUED] CISplatin (PLATINOL) 70 mg in sodium chloride 0.9 % 250 mL chemo infusion     Allergy: No  Known Allergies  Social Hx:   History   Social History  . Marital Status: Divorced    Spouse Name: N/A    Number of Children: 2  . Years of Education: N/A   Occupational History  .     Social History Main Topics  . Smoking status: Former Smoker -- 0.50 packs/day for 2 years    Types: Cigarettes    Quit date: 08/30/2013  . Smokeless tobacco: Never Used  . Alcohol Use: Yes     Comment: occasionally  . Drug Use: No  . Sexual Activity: Yes   Other Topics Concern  . Not on file   Social History Narrative  . No narrative on file    Past Surgical Hx:  Past Surgical History  Procedure Laterality Date  . Wisdom tooth extraction    . Colposcopy    . Diagnostic laparoscopy  07-28-2000    w/ lysis adhesions  . Colonoscopy  07-11-1999    Past Medical Hx:  Past Medical History  Diagnosis Date  . 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 Anxiety   . Depression   . Adenocarcinoma of cervix, stage 1     dx  may 2015   STAGE I B2 (+pet scan pelvic nodes for mets)---  oncologist--  dr Marko Plume and dr Sondra Come--  chemo and external radiation    . Blister of genital area   . Itching in the vaginal area     Past Gynecological History:  Stage IB2 cervical cancer  Patient's last menstrual period was 10/15/2013.  Family Hx:  Family History  Problem Relation Age of Onset  . Heart disease Father   . COPD Father   . Diabetes Father   . Hypertension Father   . Arthritis Father   . Arthritis Mother   . Breast cancer Maternal Aunt   . Diabetes Maternal Grandmother   . Cancer Maternal Grandmother     skin cancer  . Heart disease Maternal Grandmother   . Breast cancer Paternal Grandmother   . Cancer Maternal Aunt     lung    Review of Systems:  Constitutional  Feels shaky and weak.  ENT Normal appearing ears and nares bilaterally Skin/Breast  No rash, sores, jaundice, itching, dryness Cardiovascular  No chest pain, shortness of breath, or edema  Pulmonary  No  cough or wheeze.  Gastro Intestinal  No nausea, vomitting, or diarrhoea. No bright red blood per rectum, no abdominal pain, change in bowel movement, or constipation.  Genito Urinary  No frequency, urgency, dysuria. + vaginal bleeding, + vulvar irritation Musculo Skeletal  No myalgia, arthralgia, joint swelling or pain  Neurologic  No weakness, numbness, change in gait,  Psychology  No depression, anxiety, insomnia.   Vitals:  Last menstrual period 10/15/2013.  Physical Exam: WD in NAD Neck  Supple NROM, without any enlargements.  Lymph Node Survey No cervical supraclavicular or inguinal adenopathy Cardiovascular  Pulse normal rate, regularity and rhythm. S1 and S2 normal.  Lungs  Clear to auscultation bilateraly, without wheezes/crackles/rhonchi. Good air movement.  Skin  No rash/lesions/breakdown  Psychiatry  Alert and oriented to person, place, and time  Abdomen  Normoactive bowel sounds, abdomen soft, non-tender and obese without evidence of hernia. Back No CVA tenderness Genito Urinary  Vulva/vagina: herpetic lesions on labia minor  Bladder/urethra:  No lesions or masses, well supported bladder  Vagina: normal  Cervix: 5cm, bulky, large, friable, spontaneous trickle of blood from friable cervix.  Uterus: deferred.  Adnexa: deferred. Rectal  Deferred. Extremities  No bilateral cyanosis, clubbing or edema.  Procedure: vaginal pack (temporary) with monsel's. The patient was placed in lithotomy position in stirrups. Speculum was placed vaginally and apparently 100 cc of clot was removed from the vaginal fornix. The cervix was well well visualized and noted to be friable with low-volume bleeding from its surface. Monsel solution was applied to the surface of the cervix. A pressure was held for approximately 4minutes. After removal of pressure hemostasis was observed across the cervix with no active bleeding noted. The patient tolerated the procedure well with no active  bleeding completion of the procedure.   Donaciano Eva, MD  cell [phone]  11/15/2013, 4:03 PM

## 2013-11-15 NOTE — Progress Notes (Signed)
This provider was called in the infusion area and prior to the patient initiating her chemotherapy for complain of increased bright red vaginal bleeding and passing of clots.  Patient was noted slightly pale and was shaking.  Systolic blood pressure dropped from 121 down to 103.  Consulted with Dr. Denman George; and patient was transferred to Dr. Denman George in the breast/GYN clinic for further evaluation.

## 2013-11-15 NOTE — Patient Instructions (Signed)
[facility] Discharge Instructions for Patients Receiving Chemotherapy  Today you received the following chemotherapy agents: Cisplatin  To help prevent nausea and vomiting after your treatment, we encourage you to take your nausea medication as prescribed by your physician.    If you develop nausea and vomiting that is not controlled by your nausea medication, call the clinic.   BELOW ARE SYMPTOMS THAT SHOULD BE REPORTED IMMEDIATELY:  *FEVER GREATER THAN 100.5 F  *CHILLS WITH OR WITHOUT FEVER  NAUSEA AND VOMITING THAT IS NOT CONTROLLED WITH YOUR NAUSEA MEDICATION  *UNUSUAL SHORTNESS OF BREATH  *UNUSUAL BRUISING OR BLEEDING  TENDERNESS IN MOUTH AND THROAT WITH OR WITHOUT PRESENCE OF ULCERS  *URINARY PROBLEMS  *BOWEL PROBLEMS  UNUSUAL RASH Items with * indicate a potential emergency and should be followed up as soon as possible.  Feel free to call the clinic you have any questions or concerns. The clinic phone number is [phone].

## 2013-11-15 NOTE — Progress Notes (Signed)
Patient is in good spirits.  She reports increased fatigue and verbalizes that she "overdid it" over the weekend.  She reports nausea is improved with Ativan.  Weight decreased to 145.94 pounds July 28 down from 155.4 pounds July 22.  Nutrition diagnosis: Food and nutrition related knowledge deficit improved.  Intervention: Encouraged patient to continue healthy diet with adequate protein and calories to promote maintenance of lean body mass. Recommended small, frequent meals to improve fatigue. Encouraged patient to continue nausea medications to control nausea.   Recommended weight maintenance.  Monitoring, evaluation, goals: Patient has had weight loss.  However, this is in part due to fluid shifts and steroids.   Next visit: No followup scheduled.  Patient has my contact information for questions or concerns.  **Disclaimer: This note was dictated with voice recognition software. Similar sounding words can inadvertently be transcribed and this note may contain transcription errors which may not have been corrected upon publication of note.**

## 2013-11-15 NOTE — Progress Notes (Signed)
1120- Patient exhibiting erythema and swelling to left anterior arm before hanging CDDP. Patient reports pain. NS running and warm compresses applied to PIV site and anterior arm. Redness and swelling subsiding. This RN to monitor.   1256- Patient reports needing to go to bathroom, voided and had large amount of blood in toilet as reported by patient. Patient escorted back to bed, Cyndee NP made aware.   Wilson at bedside. Advised to hold chemo and continue NS; BSC had large amount of blood with clots. Patient shaking, pale, skin cool/clammy. NS wide open, patient in no respiratory distress.   1345- IV site changed to right AC due to left PIV occluded. NS @ 300 mL/hr. Dr. Denman George requesting to see patient for pelvic exam. Patient transported to exam room via wheelchair at 1355. This RN to stay with patient until MD arrives.  1405- Patient resting comfortably in exam room in no acute distress. Dr. Denman George at bedside.

## 2013-11-16 ENCOUNTER — Ambulatory Visit (HOSPITAL_BASED_OUTPATIENT_CLINIC_OR_DEPARTMENT_OTHER): Payer: Medicaid Other

## 2013-11-16 ENCOUNTER — Ambulatory Visit
Admission: RE | Admit: 2013-11-16 | Discharge: 2013-11-16 | Disposition: A | Discharge status: Home / Self Care | Payer: Medicaid Other | Source: Ambulatory Visit | Attending: Radiation Oncology | Admitting: Radiation Oncology

## 2013-11-16 ENCOUNTER — Other Ambulatory Visit: Payer: Self-pay | Admitting: Medical Oncology

## 2013-11-16 ENCOUNTER — Ambulatory Visit: Payer: Medicaid Other | Admitting: Radiation Oncology

## 2013-11-16 VITALS — BP 111/70 | HR 75 | Temp 98.2°F | Resp 18

## 2013-11-16 DIAGNOSIS — C539 Malignant neoplasm of cervix uteri, unspecified: Secondary | ICD-10-CM

## 2013-11-16 DIAGNOSIS — Z510 Encounter for antineoplastic radiation therapy: Secondary | ICD-10-CM | POA: Diagnosis not present

## 2013-11-16 DIAGNOSIS — D62 Acute posthemorrhagic anemia: Secondary | ICD-10-CM

## 2013-11-16 DIAGNOSIS — D649 Anemia, unspecified: Secondary | ICD-10-CM | POA: Diagnosis not present

## 2013-11-16 MED ORDER — DIPHENHYDRAMINE HCL 25 MG PO CAPS
25.0000 mg | ORAL_CAPSULE | Freq: Once | ORAL | Status: AC
  Administered 2013-11-16: 25 mg via ORAL

## 2013-11-16 MED ORDER — SODIUM CHLORIDE 0.9 % IJ SOLN
10.0000 mL | INTRAMUSCULAR | Status: DC | PRN
  Filled 2013-11-16: qty 10

## 2013-11-16 MED ORDER — HEPARIN SOD (PORK) LOCK FLUSH 100 UNIT/ML IV SOLN
500.0000 [IU] | Freq: Every day | INTRAVENOUS | Status: DC | PRN
  Filled 2013-11-16: qty 5

## 2013-11-16 MED ORDER — ACETAMINOPHEN 325 MG PO TABS
ORAL_TABLET | ORAL | Status: AC
  Filled 2013-11-16: qty 2

## 2013-11-16 MED ORDER — SODIUM CHLORIDE 0.9 % IV SOLN
INTRAVENOUS | Status: DC
  Administered 2013-11-16: 14:00:00 via INTRAVENOUS

## 2013-11-16 MED ORDER — ACETAMINOPHEN 325 MG PO TABS
650.0000 mg | ORAL_TABLET | Freq: Once | ORAL | Status: AC
  Administered 2013-11-16: 650 mg via ORAL

## 2013-11-16 MED ORDER — DIPHENHYDRAMINE HCL 25 MG PO CAPS
ORAL_CAPSULE | ORAL | Status: AC
  Filled 2013-11-16: qty 1

## 2013-11-16 NOTE — Patient Instructions (Signed)

## 2013-11-16 NOTE — Progress Notes (Signed)
1615- Patient taken to radiation via wheelchair for treatment. IV site intact, blood infusing without difficulty.

## 2013-11-16 NOTE — Progress Notes (Signed)
Weekly Management Note Current Dose:45 Gy  Projected Dose: 45Gy   Narrative:  The patient presents for routine under treatment assessment.  CBCT/MVCT images/Port film x-rays were reviewed.  The chart was checked. More bleeding yesterday and was packed again. Transfusing blood now. No pain. Nausea and diarrhea controlled with meds.  Physical Findings:  Alert, pleasant.    Wt Readings from Last 3 Encounters:  11/09/13 145 lb 15.1 oz (66.2 kg)  11/03/13 155 lb 6.4 oz (70.489 kg)  11/02/13 151 lb 11.2 oz (68.811 kg)   Lab Results  Component Value Date   WBC 3.8* 11/15/2013   HGB 7.6* 11/15/2013   HCT 22.8* 11/15/2013   MCV 89.4 11/15/2013   PLT 148 11/15/2013   Lab Results  Component Value Date   CREATININE 0.7 11/15/2013   BUN 12.3 11/15/2013   NA 140 11/15/2013   K 3.6 11/15/2013   CL 105 01/27/2009   CO2 27 11/15/2013     Impression:  The patient is tolerating radiation.  Plan:  Continue treatment as planned. Continue blood transfusion, imodium and zofran.

## 2013-11-17 ENCOUNTER — Ambulatory Visit
Admission: RE | Admit: 2013-11-17 | Discharge: 2013-11-17 | Disposition: A | Discharge status: Home / Self Care | Payer: Medicaid Other | Source: Ambulatory Visit | Attending: Radiation Oncology | Admitting: Radiation Oncology

## 2013-11-17 ENCOUNTER — Encounter (HOSPITAL_BASED_OUTPATIENT_CLINIC_OR_DEPARTMENT_OTHER): Payer: Self-pay | Admitting: *Deleted

## 2013-11-17 DIAGNOSIS — Z510 Encounter for antineoplastic radiation therapy: Secondary | ICD-10-CM | POA: Diagnosis not present

## 2013-11-17 LAB — TYPE AND SCREEN
ABO/RH(D): O POS
ANTIBODY SCREEN: NEGATIVE
UNIT DIVISION: 0
Unit division: 0

## 2013-11-17 NOTE — Progress Notes (Signed)
NPO AFTER MN. ARRIVE AT 0600. NEEDS ISTAT ,  UNLESS HAS LAB WORK DONE AGAIN AFTER 11-15-2013. PT HAS CHEMO SCHEDULED FOR THIS Friday 11-19-2013.  WILL TAKE PROTONIX AND ACYCLOVIR AM DOS W/ SIPS OF WATER.

## 2013-11-18 ENCOUNTER — Ambulatory Visit
Admission: RE | Admit: 2013-11-18 | Discharge: 2013-11-18 | Disposition: A | Discharge status: Home / Self Care | Payer: Medicaid Other | Source: Ambulatory Visit | Attending: Radiation Oncology | Admitting: Radiation Oncology

## 2013-11-18 DIAGNOSIS — Z510 Encounter for antineoplastic radiation therapy: Secondary | ICD-10-CM | POA: Diagnosis not present

## 2013-11-19 ENCOUNTER — Ambulatory Visit
Admission: RE | Admit: 2013-11-19 | Discharge: 2013-11-19 | Disposition: A | Discharge status: Home / Self Care | Payer: Medicaid Other | Source: Ambulatory Visit | Attending: Radiation Oncology | Admitting: Radiation Oncology

## 2013-11-19 ENCOUNTER — Emergency Department (HOSPITAL_BASED_OUTPATIENT_CLINIC_OR_DEPARTMENT_OTHER): Payer: Medicaid Other

## 2013-11-19 ENCOUNTER — Emergency Department (HOSPITAL_BASED_OUTPATIENT_CLINIC_OR_DEPARTMENT_OTHER)
Admission: EM | Admit: 2013-11-19 | Discharge: 2013-11-19 | Disposition: A | Discharge status: Home / Self Care | Payer: Medicaid Other | Attending: Emergency Medicine | Admitting: Emergency Medicine

## 2013-11-19 ENCOUNTER — Encounter (HOSPITAL_BASED_OUTPATIENT_CLINIC_OR_DEPARTMENT_OTHER): Payer: Self-pay | Admitting: Emergency Medicine

## 2013-11-19 DIAGNOSIS — Z510 Encounter for antineoplastic radiation therapy: Secondary | ICD-10-CM | POA: Diagnosis not present

## 2013-11-19 DIAGNOSIS — C539 Malignant neoplasm of cervix uteri, unspecified: Secondary | ICD-10-CM | POA: Insufficient documentation

## 2013-11-19 DIAGNOSIS — Z79899 Other long term (current) drug therapy: Secondary | ICD-10-CM | POA: Insufficient documentation

## 2013-11-19 DIAGNOSIS — IMO0001 Reserved for inherently not codable concepts without codable children: Secondary | ICD-10-CM | POA: Diagnosis not present

## 2013-11-19 DIAGNOSIS — Z8619 Personal history of other infectious and parasitic diseases: Secondary | ICD-10-CM | POA: Diagnosis not present

## 2013-11-19 DIAGNOSIS — R509 Fever, unspecified: Secondary | ICD-10-CM | POA: Insufficient documentation

## 2013-11-19 DIAGNOSIS — R5381 Other malaise: Secondary | ICD-10-CM | POA: Diagnosis not present

## 2013-11-19 DIAGNOSIS — F411 Generalized anxiety disorder: Secondary | ICD-10-CM | POA: Diagnosis not present

## 2013-11-19 DIAGNOSIS — N390 Urinary tract infection, site not specified: Secondary | ICD-10-CM | POA: Insufficient documentation

## 2013-11-19 DIAGNOSIS — Z87891 Personal history of nicotine dependence: Secondary | ICD-10-CM | POA: Insufficient documentation

## 2013-11-19 DIAGNOSIS — K219 Gastro-esophageal reflux disease without esophagitis: Secondary | ICD-10-CM | POA: Insufficient documentation

## 2013-11-19 DIAGNOSIS — R5383 Other fatigue: Secondary | ICD-10-CM

## 2013-11-19 LAB — CBC WITH DIFFERENTIAL/PLATELET
BASOS PCT: 0 % (ref 0–1)
Basophils Absolute: 0 10*3/uL (ref 0.0–0.1)
EOS ABS: 0 10*3/uL (ref 0.0–0.7)
Eosinophils Relative: 1 % (ref 0–5)
HCT: 32.9 % — ABNORMAL LOW (ref 36.0–46.0)
HEMOGLOBIN: 11 g/dL — AB (ref 12.0–15.0)
Lymphocytes Relative: 8 % — ABNORMAL LOW (ref 12–46)
Lymphs Abs: 0.4 10*3/uL — ABNORMAL LOW (ref 0.7–4.0)
MCH: 30.3 pg (ref 26.0–34.0)
MCHC: 33.4 g/dL (ref 30.0–36.0)
MCV: 90.6 fL (ref 78.0–100.0)
Monocytes Absolute: 0.4 10*3/uL (ref 0.1–1.0)
Monocytes Relative: 9 % (ref 3–12)
NEUTROS PCT: 82 % — AB (ref 43–77)
Neutro Abs: 3.8 10*3/uL (ref 1.7–7.7)
Platelets: 148 10*3/uL — ABNORMAL LOW (ref 150–400)
RBC: 3.63 MIL/uL — ABNORMAL LOW (ref 3.87–5.11)
RDW: 15.6 % — AB (ref 11.5–15.5)
WBC: 4.7 10*3/uL (ref 4.0–10.5)

## 2013-11-19 LAB — URINE MICROSCOPIC-ADD ON

## 2013-11-19 LAB — URINALYSIS, ROUTINE W REFLEX MICROSCOPIC
BILIRUBIN URINE: NEGATIVE
Glucose, UA: NEGATIVE mg/dL
Ketones, ur: NEGATIVE mg/dL
Nitrite: NEGATIVE
PROTEIN: NEGATIVE mg/dL
Specific Gravity, Urine: 1.022 (ref 1.005–1.030)
UROBILINOGEN UA: 0.2 mg/dL (ref 0.0–1.0)
pH: 5 (ref 5.0–8.0)

## 2013-11-19 LAB — BASIC METABOLIC PANEL
Anion gap: 12 (ref 5–15)
BUN: 12 mg/dL (ref 6–23)
CO2: 28 mEq/L (ref 19–32)
CREATININE: 0.8 mg/dL (ref 0.50–1.10)
Calcium: 9.6 mg/dL (ref 8.4–10.5)
Chloride: 99 mEq/L (ref 96–112)
GFR calc Af Amer: >90 mL/min (ref >90)
Glucose, Bld: 115 mg/dL — ABNORMAL HIGH (ref 70–99)
POTASSIUM: 4.2 meq/L (ref 3.7–5.3)
Sodium: 139 mEq/L (ref 137–147)

## 2013-11-19 LAB — I-STAT CG4 LACTIC ACID, ED: Lactic Acid, Venous: 1.21 mmol/L (ref 0.5–2.2)

## 2013-11-19 MED ORDER — CEPHALEXIN 500 MG PO CAPS: 500.0000 mg | ORAL_CAPSULE | Freq: Three times a day (TID) | ORAL | Status: DC

## 2013-11-19 MED ORDER — CEFTRIAXONE SODIUM 1 G IJ SOLR
INTRAMUSCULAR | Status: AC
  Filled 2013-11-19: qty 10

## 2013-11-19 MED ORDER — DEXTROSE 5 % IV SOLN
1.0000 g | Freq: Once | INTRAVENOUS | Status: AC
  Administered 2013-11-19: 1 g via INTRAVENOUS

## 2013-11-19 NOTE — ED Notes (Signed)
Fever onset this pm after radiation tx this am  Legs aching which is abnormal after radiation

## 2013-11-19 NOTE — Discharge Instructions (Signed)
Fever, Adult A fever is a higher than normal body temperature. In an adult, an oral temperature around 98.6 F (37 C) is considered normal. A temperature of 100.4 F (38 C) or higher is generally considered a fever. Mild or moderate fevers generally have no long-term effects and often do not require treatment. Extreme fever (greater than or equal to 106 F or 41.1 C) can cause seizures. The sweating that may occur with repeated or prolonged fever may cause dehydration. Elderly people can develop confusion during a fever. A measured temperature can vary with:  Age.  Time of day.  Method of measurement (mouth, underarm, rectal, or ear). The fever is confirmed by taking a temperature with a thermometer. Temperatures can be taken different ways. Some methods are accurate and some are not.  An oral temperature is used most commonly. Electronic thermometers are fast and accurate.  An ear temperature will only be accurate if the thermometer is positioned as recommended by the manufacturer.  A rectal temperature is accurate and done for those adults who have a condition where an oral temperature cannot be taken.  An underarm (axillary) temperature is not accurate and not recommended. Fever is a symptom, not a disease.  CAUSES   Infections commonly cause fever.  Some noninfectious causes for fever include:  Some arthritis conditions.  Some thyroid or adrenal gland conditions.  Some immune system conditions.  Some types of cancer.  A medicine reaction.  High doses of certain street drugs such as methamphetamine.  Dehydration.  Exposure to high outside or room temperatures.  Occasionally, the source of a fever cannot be determined. This is sometimes called a "fever of unknown origin" (FUO).  Some situations may lead to a temporary rise in body temperature that may go away on its own. Examples are:  Childbirth.  Surgery.  Intense exercise. HOME CARE INSTRUCTIONS   Take  appropriate medicines for fever. Follow dosing instructions carefully. If you use acetaminophen to reduce the fever, be careful to avoid taking other medicines that also contain acetaminophen. Do not take aspirin for a fever if you are younger than age 67. There is an association with Reye's syndrome. Reye's syndrome is a rare but potentially deadly disease.  If an infection is present and antibiotics have been prescribed, take them as directed. Finish them even if you start to feel better.  Rest as needed.  Maintain an adequate fluid intake. To prevent dehydration during an illness with prolonged or recurrent fever, you may need to drink extra fluid.Drink enough fluids to keep your urine clear or pale yellow.  Sponging or bathing with room temperature water may help reduce body temperature. Do not use ice water or alcohol sponge baths.  Dress comfortably, but do not over-bundle. SEEK MEDICAL CARE IF:   You are unable to keep fluids down.  You develop vomiting or diarrhea.  You are not feeling at least partly better after 3 days.  You develop new symptoms or problems. SEEK IMMEDIATE MEDICAL CARE IF:   You have shortness of breath or trouble breathing.  You develop excessive weakness.  You are dizzy or you faint.  You are extremely thirsty or you are making little or no urine.  You develop new pain that was not there before (such as in the head, neck, chest, back, or abdomen).  You have persistent vomiting and diarrhea for more than 1 to 2 days.  You develop a stiff neck or your eyes become sensitive to light.  You develop a  skin rash. °· You have a fever or persistent symptoms for more than 2 to 3 days. °· You have a fever and your symptoms suddenly get worse. °MAKE SURE YOU:  °· Understand these instructions. °· Will watch your condition. °· Will get help right away if you are not doing well or get worse. °Document Released: 09/25/2000 Document Revised: 08/16/2013 Document  Reviewed: 01/31/2011 °ExitCare® Patient Information ©2015 [employer]. This information is not intended to replace advice given to you by your health care provider. Make sure you discuss any questions you have with your health care provider. ° °Urinary Tract Infection °Urinary tract infections (UTIs) can develop anywhere along your urinary tract. Your urinary tract is your body's drainage system for removing wastes and extra water. Your urinary tract includes two kidneys, two ureters, a bladder, and a urethra. Your kidneys are a pair of bean-shaped organs. Each kidney is about the size of your fist. They are located below your ribs, one on each side of your spine. °CAUSES °Infections are caused by microbes, which are microscopic organisms, including fungi, viruses, and bacteria. These organisms are so small that they can only be seen through a microscope. Bacteria are the microbes that most commonly cause UTIs. °SYMPTOMS  °Symptoms of UTIs may vary by age and gender of the patient and by the location of the infection. Symptoms in young women typically include a frequent and intense urge to urinate and a painful, burning feeling in the bladder or urethra during urination. Older women and men are more likely to be tired, shaky, and weak and have muscle aches and abdominal pain. A fever may mean the infection is in your kidneys. Other symptoms of a kidney infection include pain in your back or sides below the ribs, nausea, and vomiting. °DIAGNOSIS °To diagnose a UTI, your caregiver will ask you about your symptoms. Your caregiver also will ask to provide a urine sample. The urine sample will be tested for bacteria and white blood cells. White blood cells are made by your body to help fight infection. °TREATMENT  °Typically, UTIs can be treated with medication. Because most UTIs are caused by a bacterial infection, they usually can be treated with the use of antibiotics. The choice of antibiotic and length of  treatment depend on your symptoms and the type of bacteria causing your infection. °HOME CARE INSTRUCTIONS °· If you were prescribed antibiotics, take them exactly as your caregiver instructs you. Finish the medication even if you feel better after you have only taken some of the medication. °· Drink enough water and fluids to keep your urine clear or pale yellow. °· Avoid caffeine, tea, and carbonated beverages. They tend to irritate your bladder. °· Empty your bladder often. Avoid holding urine for long periods of time. °· Empty your bladder before and after sexual intercourse. °· After a bowel movement, women should cleanse from front to back. Use each tissue only once. °SEEK MEDICAL CARE IF:  °· You have back pain. °· You develop a fever. °· Your symptoms do not begin to resolve within 3 days. °SEEK IMMEDIATE MEDICAL CARE IF:  °· You have severe back pain or lower abdominal pain. °· You develop chills. °· You have nausea or vomiting. °· You have continued burning or discomfort with urination. °MAKE SURE YOU:  °· Understand these instructions. °· Will watch your condition. °· Will get help right away if you are not doing well or get worse. °Document Released: 01/09/2005 Document Revised: 10/01/2011 Document Reviewed: 05/10/2011 °  ExitCare® Patient Information ©2015 [employer]. This information is not intended to replace advice given to you by your health care provider. Make sure you discuss any questions you have with your health care provider. ° °

## 2013-11-19 NOTE — ED Notes (Signed)
Paged Dr.Sehbai at [phone], returned call at [phone]

## 2013-11-19 NOTE — ED Provider Notes (Signed)
CSN: [id number]     Arrival date & time 11/19/13  1953 History  This chart was scribed for Karen Hamburger, MD by Einar Pheasant, ED Scribe. This patient was seen in room MH11/MH11 and the patient's care was started at 8:24 PM.    Chief Complaint  Patient presents with  . Fever   HPI HPI Comments: Karen Daniels is a 38 y.o. female with a history of cervical cancer presents to the Emergency Department complaining of a fever that started 1.5 hours ago.  Pt is currently receiving chemotherapy once a week and radiation everyday. She was told to report to the ED if she spiked a fever. Pt states that when her symptoms began to feel really tired, with a measured TMAX of a 100.2. Current ED temp is 99.8. She is also experiencing bilateral leg pain  (aching) that started approximately 2 days ago. She describes the pain as "thorobbing and aching" in nature. Denies cough, dysuria, vomiting, nausea, emesis, or abdominal pain.    Past Medical History  Diagnosis Date  . 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 Anxiety   . Depression   . Adenocarcinoma of cervix, stage 1     dx  may 2015   STAGE I B2 (+pet scan pelvic nodes for mets)---  oncologist--  dr Marko Plume and dr Sondra Come--  chemo and external radiation    . GERD (gastroesophageal reflux disease)    Past Surgical History  Procedure Laterality Date  . Wisdom tooth extraction    . Colposcopy    . Diagnostic laparoscopy  07-28-2000    w/ lysis adhesions  . Colonoscopy  07-11-1999   Family History  Problem Relation Age of Onset  . Heart disease Father   . COPD Father   . Diabetes Father   . Hypertension Father   . Arthritis Father   . Arthritis Mother   . Breast cancer Maternal Aunt   . Diabetes Maternal Grandmother   . Cancer Maternal Grandmother     skin cancer  . Heart disease Maternal Grandmother   . Breast cancer Paternal Grandmother   . Cancer Maternal Aunt     lung   History  Substance Use Topics  . Smoking status:  Former Smoker -- 0.50 packs/day for 2 years    Types: Cigarettes    Quit date: 08/30/2013  . Smokeless tobacco: Never Used  . Alcohol Use: Yes     Comment: occasionally   OB History   Grav Para Term Preterm Abortions TAB SAB Ect Mult Living   4 2 2  2 1 1   2      Review of Systems  Constitutional: Positive for fever and fatigue. Negative for chills.  HENT: Negative for congestion.   Respiratory: Negative for cough and shortness of breath.   Gastrointestinal: Negative for nausea, vomiting and abdominal pain.  Genitourinary: Negative for dysuria, vaginal bleeding and vaginal discharge.  Musculoskeletal: Positive for myalgias (biilateral lower extremities).  Neurological: Negative for headaches.  All other systems reviewed and are negative.  Allergies  Review of patient's allergies indicates no known allergies.  Home Medications   Prior to Admission medications   Medication Sig Start Date End Date Taking? Authorizing Provider  acyclovir (ZOVIRAX) 400 MG tablet Take 1 tablet (400 mg total) by mouth 2 (two) times daily. Begin after 10 day full course dose for suppression of lesions for 1-2 months beyond completition of chemotherapy/RT. 11/05/13   Lennis Marion Downer, MD  loperamide (IMODIUM A-D)  2 MG tablet Take 2 mg by mouth 4 (four) times daily as needed for diarrhea or loose stools.    Historical Provider, MD  LORazepam (ATIVAN) 0.5 MG tablet Take 1 -2 tablets by mouth or under the tongue every 6 hours as needed for nausea. 11/15/13   Everitt Amber, MD  Norgestimate-Ethinyl Estradiol Triphasic 0.18/0.215/0.25 MG-25 MCG tab Take 1 tablet by mouth every morning. 10/18/13   Lavonia Drafts, MD  ondansetron (ZOFRAN) 8 MG tablet Take one tablet every 8 hours as needed for nausea. Start the 3rd day after chemotherapy. 10/11/13   Lennis Marion Downer, MD  pantoprazole (PROTONIX) 40 MG tablet Take 40 mg by mouth as needed. 11/05/13   Lennis P Livesay, MD   BP 120/67  Pulse 95  Temp(Src) 99.8 F  (37.7 C) (Oral)  Resp 18  Ht 5\' 5"  (1.651 m)  Wt 147 lb (66.679 kg)  BMI 24.46 kg/m2  SpO2 100%  LMP 11/10/2013  Physical Exam  Nursing note and vitals reviewed. Constitutional: She is oriented to person, place, and time. She appears well-developed and well-nourished. No distress.  HENT:  Head: Normocephalic and atraumatic.  Right Ear: External ear normal.  Left Ear: External ear normal.  Nose: Nose normal.  Eyes: Right eye exhibits no discharge. Left eye exhibits no discharge.  Neck: Neck supple.  Cardiovascular: Normal rate, regular rhythm and normal heart sounds.   Pulmonary/Chest: Effort normal and breath sounds normal. She has no wheezes. She has no rales.  Abdominal: Soft. She exhibits no distension. There is no tenderness.  Musculoskeletal: Normal range of motion. She exhibits no edema and no tenderness.  No tenderness, swelling, or erythema noted to bilateral lower extremities.   Neurological: She is alert and oriented to person, place, and time.  Skin: Skin is warm and dry. No erythema.  Psychiatric: She has a normal mood and affect. Her behavior is normal.    ED Course  Procedures (including critical care time)  DIAGNOSTIC STUDIES: Oxygen Saturation is 100% on, RA, normal by my interpretation.    COORDINATION OF CARE: 8:28 PM- Pt advised of plan for treatment and pt agrees.  Labs Review Labs Reviewed  CBC WITH DIFFERENTIAL - Abnormal; Notable for the following:    RBC 3.63 (*)    Hemoglobin 11.0 (*)    HCT 32.9 (*)    RDW 15.6 (*)    Platelets 148 (*)    Neutrophils Relative % 82 (*)    Lymphocytes Relative 8 (*)    Lymphs Abs 0.4 (*)    All other components within normal limits  BASIC METABOLIC PANEL - Abnormal; Notable for the following:    Glucose, Bld 115 (*)    All other components within normal limits  URINALYSIS, ROUTINE W REFLEX MICROSCOPIC - Abnormal; Notable for the following:    APPearance CLOUDY (*)    Hgb urine dipstick SMALL (*)     Leukocytes, UA LARGE (*)    All other components within normal limits  URINE MICROSCOPIC-ADD ON - Abnormal; Notable for the following:    Squamous Epithelial / LPF FEW (*)    Bacteria, UA FEW (*)    All other components within normal limits  URINE CULTURE  CULTURE, BLOOD (ROUTINE X 2)  CULTURE, BLOOD (ROUTINE X 2)  I-STAT CG4 LACTIC ACID, ED    Imaging Review Dg Chest 2 View  11/19/2013   CLINICAL DATA:  38 year old female with fever. Currently undergoing chemo and radiation treatment for cervical cancer.  EXAM: CHEST  2 VIEW  COMPARISON:  08/29/2013 chest radiograph  FINDINGS: The cardiomediastinal silhouette is unremarkable.  The lungs are clear.  There is no evidence of focal airspace disease, pulmonary edema, suspicious pulmonary nodule/mass, pleural effusion, or pneumothorax. No acute bony abnormalities are identified.  IMPRESSION: No active cardiopulmonary disease.   Electronically Signed   By: Hassan Rowan M.D.   On: 11/19/2013 20:39    MDM   Final diagnoses:  Other specified fever  UTI (lower urinary tract infection)    Patient has no indwelling lines. No neutropenia. Well appearing, vital signs benign. Declines pain or anti-pyretics. Has likely UTI on urine, no current symptoms. Given rocephin after blood and urine cultures. She is otherwise well appearing and I feel is able to treat as an outpatient. D/w oncology on call, Dr. Lona Kettle, who is recommending oral abx and f/u Monday (3 days) as scheduled. Patient comfortable with this plan, will return if symptoms worsen.  I personally performed the services described in this documentation, which was scribed in my presence. The recorded information has been reviewed and is accurate.     Karen Hamburger, MD 11/19/13 [phone]

## 2013-11-19 NOTE — ED Notes (Signed)
Fever x today (100.2-no meds)-pt is being treated for cervical cancer x 6 weeks and was advised to come to ED if develop fever-last radiation tx today

## 2013-11-21 LAB — URINE CULTURE: Colony Count: >=100000

## 2013-11-22 ENCOUNTER — Telehealth: Payer: Self-pay | Admitting: *Deleted

## 2013-11-22 ENCOUNTER — Ambulatory Visit: Payer: Medicaid Other

## 2013-11-22 ENCOUNTER — Ambulatory Visit
Admission: RE | Admit: 2013-11-22 | Discharge: 2013-11-22 | Disposition: A | Discharge status: Home / Self Care | Payer: Medicaid Other | Source: Ambulatory Visit | Attending: Radiation Oncology | Admitting: Radiation Oncology

## 2013-11-22 DIAGNOSIS — Z510 Encounter for antineoplastic radiation therapy: Secondary | ICD-10-CM | POA: Diagnosis not present

## 2013-11-22 NOTE — Telephone Encounter (Signed)
CALLED PATIENT TO INFORM THAT  TANDEM , RING PLACEMENT HAS BEEN CANCELLED FOR 11-23-13 PER DR. KINARD REQUEST, SPOKE WITH PATIENT AND SHE IS AWARE OF THIS

## 2013-11-23 ENCOUNTER — Encounter: Payer: Self-pay | Admitting: Radiation Oncology

## 2013-11-23 ENCOUNTER — Ambulatory Visit (HOSPITAL_BASED_OUTPATIENT_CLINIC_OR_DEPARTMENT_OTHER): Admission: RE | Admit: 2013-11-23 | Payer: Medicaid Other | Source: Ambulatory Visit | Admitting: Radiation Oncology

## 2013-11-23 ENCOUNTER — Ambulatory Visit (HOSPITAL_COMMUNITY): Payer: Medicaid Other

## 2013-11-23 ENCOUNTER — Ambulatory Visit: Payer: Medicaid Other | Admitting: Radiation Oncology

## 2013-11-23 ENCOUNTER — Ambulatory Visit
Admission: RE | Admit: 2013-11-23 | Discharge: 2013-11-23 | Disposition: A | Discharge status: Home / Self Care | Payer: Medicaid Other | Source: Ambulatory Visit | Attending: Radiation Oncology | Admitting: Radiation Oncology

## 2013-11-23 ENCOUNTER — Ambulatory Visit: Payer: Medicaid Other

## 2013-11-23 VITALS — BP 110/68 | HR 95 | Temp 98.1°F | Ht 65.0 in | Wt 147.6 lb

## 2013-11-23 DIAGNOSIS — C539 Malignant neoplasm of cervix uteri, unspecified: Secondary | ICD-10-CM

## 2013-11-23 DIAGNOSIS — Z510 Encounter for antineoplastic radiation therapy: Secondary | ICD-10-CM | POA: Diagnosis not present

## 2013-11-23 HISTORY — DX: Gastro-esophageal reflux disease without esophagitis: K21.9

## 2013-11-23 SURGERY — INSERTION, UTERINE TANDEM AND RING OR CYLINDER, FOR BRACHYTHERAPY
Anesthesia: General

## 2013-11-23 NOTE — Progress Notes (Signed)
Karen Daniels completed radiation therapy today. Denies and pain nor bleeding, and reports that her energy level "is getting better".

## 2013-11-23 NOTE — Progress Notes (Signed)
  Radiation Oncology         [phone] ________________________________  Name: Karen Daniels MRN: [id number]  Date: 11/23/2013  DOB: Jul 18, 1975  Weekly Radiation Therapy Management  DIAGNOSIS: Clinical stage IB-2 adenocarcinoma the cervix with PET positive lymph nodes in the pelvis region   Current Dose: 54 Gy     Planned Dose:  54 Gy  Narrative . . . . . . . . The patient presents for routine under treatment assessment.                                   The patient is without complaint. She is having no more significant vaginal bleeding since her second episode last week. She feels well today and happy to complete her radiation therapy. The patient was originally scheduled for her first intracavitary brachytherapy treatment today however in light of 2 significant cervical bleeds and modest response to radiation therapy, this procedure was canceled                                 Set-up films were reviewed.                                 The chart was checked. Physical Findings. . .  height is 5\' 5"  (1.651 m) and weight is 147 lb 9.6 oz (66.951 kg). Her temperature is 98.1 F (36.7 C). Her blood pressure is 110/68 and her pulse is 95. . Weight essentially stable.  No significant changes. Impression . . . . . . . The patient is tolerating radiation. Plan . . . . . . . . . . . Marland Kitchen routine followup in one month. The patient will be scheduled for re-evaluation with gynecologic oncology to be evaluated for extrafascial hysterectomy.  ________________________________   Blair Promise, PhD, MD

## 2013-11-24 ENCOUNTER — Ambulatory Visit: Payer: Medicaid Other

## 2013-11-24 ENCOUNTER — Telehealth: Payer: Self-pay | Admitting: *Deleted

## 2013-11-24 NOTE — Telephone Encounter (Signed)
Called pt regarding Surgery per Dr. Sondra Come. LMOVM for pt to call back to discuss f/u appt with MD prior to surgery on Sept 22

## 2013-11-25 ENCOUNTER — Telehealth: Payer: Self-pay | Admitting: Gynecologic Oncology

## 2013-11-25 LAB — CULTURE, BLOOD (ROUTINE X 2)
CULTURE: NO GROWTH
Culture: NO GROWTH

## 2013-11-25 NOTE — Telephone Encounter (Signed)
Called pt regaridng up coming surgery. Pt advised she spoke with Dr. Alycia Rossetti and she is planning on having surgery at [facility] sometime around 2nd week of September. Pt is expecting a call from [facility] to confirm pre-op and surgery dates.  Encouraged pt to call us if she has any concerns.  Pt surgery at [facility] to be cancelled per this conversation.

## 2013-11-25 NOTE — Telephone Encounter (Signed)
LM for patient to call me back. PG 

## 2013-11-26 ENCOUNTER — Telehealth: Payer: Self-pay | Admitting: *Deleted

## 2013-11-26 NOTE — Telephone Encounter (Signed)
Called scheduling and cancelled procedure sept 15. Pt going to [facility] for surgery

## 2013-12-03 ENCOUNTER — Encounter: Payer: Self-pay | Admitting: Radiation Oncology

## 2013-12-03 NOTE — Progress Notes (Signed)
  Radiation Oncology         [phone] ________________________________  Name: GELISA TIEKEN MRN: [id number]  Date: 12/03/2013  DOB: [birth year]  End of Treatment Note  Diagnosis: Clinical stage IB-2 adenocarcinoma the cervix with PET positive lymph nodes in the pelvis region     Indication for treatment:  Initially the patient was scheduled for definitive treatment along with radiosensitizing chemotherapy, however in light of the suboptimal response to her treatment, treatment goal was preop      Radiation treatment dates:   June 29 through August 11  Site/dose:   Whole pelvis 45 gray in 25 fractions,  the PET positive lymph nodes were boosted an additional 9 Gray for a cumulative dose of 54 gray  Beams/energy:   Initial treatment was 4 field set up, 3-D conformal 15 mv photons,  PET positive lymph nodes were boosted using IMRT with 2 rapid arcs fields, 6 MV photons  Narrative: The patient tolerated radiation treatment relatively well.   She did experience some nausea and diarrhea during her radiation therapy which was controlled well with medication.  The patient experienced 2 significant episodes of vaginal bleeding during the course of her radiation therapy which is quite unusual. On clinical exam she had suboptimal tumor shrinkage and therefore brachytherapy was not performed. After discussion with gynecologic oncology it was felt best that the patient proceed with an extrafascial hysterectomy rather than brachytherapy treatments  Plan: The patient will be scheduled for surgery at [facility] ~ one-month out from completion of her radiation therapy.  -----------------------------------  Blair Promise, PhD, MD

## 2013-12-05 ENCOUNTER — Other Ambulatory Visit: Payer: Self-pay | Admitting: Oncology

## 2013-12-06 ENCOUNTER — Ambulatory Visit (HOSPITAL_BASED_OUTPATIENT_CLINIC_OR_DEPARTMENT_OTHER): Payer: Medicaid Other | Admitting: Oncology

## 2013-12-06 ENCOUNTER — Other Ambulatory Visit (HOSPITAL_BASED_OUTPATIENT_CLINIC_OR_DEPARTMENT_OTHER): Payer: Medicaid Other

## 2013-12-06 ENCOUNTER — Encounter: Payer: Self-pay | Admitting: Oncology

## 2013-12-06 VITALS — BP 128/76 | HR 74 | Temp 98.6°F | Resp 18 | Ht 65.0 in | Wt 152.8 lb

## 2013-12-06 DIAGNOSIS — C539 Malignant neoplasm of cervix uteri, unspecified: Secondary | ICD-10-CM

## 2013-12-06 DIAGNOSIS — C775 Secondary and unspecified malignant neoplasm of intrapelvic lymph nodes: Secondary | ICD-10-CM

## 2013-12-06 DIAGNOSIS — Z30432 Encounter for removal of intrauterine contraceptive device: Secondary | ICD-10-CM

## 2013-12-06 DIAGNOSIS — D649 Anemia, unspecified: Secondary | ICD-10-CM

## 2013-12-06 LAB — COMPREHENSIVE METABOLIC PANEL (CC13)
ALBUMIN: 3.6 g/dL (ref 3.5–5.0)
ALK PHOS: 71 U/L (ref 40–150)
ALT: 12 U/L (ref 0–55)
AST: 19 U/L (ref 5–34)
Anion Gap: 6 mEq/L (ref 3–11)
BUN: 10.8 mg/dL (ref 7.0–26.0)
CO2: 28 mEq/L (ref 22–29)
Calcium: 9.1 mg/dL (ref 8.4–10.4)
Chloride: 107 mEq/L (ref 98–109)
Creatinine: 0.8 mg/dL (ref 0.6–1.1)
Glucose: 78 mg/dl (ref 70–140)
Potassium: 4.1 mEq/L (ref 3.5–5.1)
Sodium: 141 mEq/L (ref 136–145)
Total Bilirubin: 0.21 mg/dL (ref 0.20–1.20)
Total Protein: 6.5 g/dL (ref 6.4–8.3)

## 2013-12-06 LAB — CBC WITH DIFFERENTIAL/PLATELET
BASO%: 0.3 % (ref 0.0–2.0)
BASOS ABS: 0 10*3/uL (ref 0.0–0.1)
EOS%: 1.1 % (ref 0.0–7.0)
Eosinophils Absolute: 0 10*3/uL (ref 0.0–0.5)
HCT: 30.9 % — ABNORMAL LOW (ref 34.8–46.6)
HEMOGLOBIN: 10.5 g/dL — AB (ref 11.6–15.9)
LYMPH%: 29.8 % (ref 14.0–49.7)
MCH: 30.9 pg (ref 25.1–34.0)
MCHC: 33.9 g/dL (ref 31.5–36.0)
MCV: 91.3 fL (ref 79.5–101.0)
MONO#: 0.5 10*3/uL (ref 0.1–0.9)
MONO%: 18.3 % — AB (ref 0.0–14.0)
NEUT#: 1.3 10*3/uL — ABNORMAL LOW (ref 1.5–6.5)
NEUT%: 50.5 % (ref 38.4–76.8)
Platelets: 267 10*3/uL (ref 145–400)
RBC: 3.39 10*6/uL — ABNORMAL LOW (ref 3.70–5.45)
RDW: 20.1 % — AB (ref 11.2–14.5)
WBC: 2.5 10*3/uL — ABNORMAL LOW (ref 3.9–10.3)
lymph#: 0.7 10*3/uL — ABNORMAL LOW (ref 0.9–3.3)

## 2013-12-06 MED ORDER — FERROUS FUMARATE 325 (106 FE) MG PO TABS: ORAL_TABLET | ORAL | Status: DC

## 2013-12-06 NOTE — Progress Notes (Signed)
OFFICE PROGRESS NOTE   12/06/2013   Physicians:Paola Gehrig; J.Kinard, C.Harraway-Smith    INTERVAL HISTORY:  [name], together with mother, in follow up of sensitizing CDDP used with radiation for clinical IB2 adenocarcinoma of cervix. She had 5 cycles of CDDP from 10-11-13 thru 11-08-13, cycle 6 held 11-15-13 due to recurrrent bleeding. She had heavy vaginal bleedig on 11-09-13 and again on 11-15-13, transfused total 3 units PRBCs from 7-29 thru 11-16-13. External beam RT was given 10-11-13 thru 11-23-13, 45 cGy to whole pelvis, + boost to positive nodes, however she had suboptimal response so that HDR will not be done and the patient is to go to surgery at [facility] 12-24-13 instead. She is scheduled for preop evaluation at [facility] next week, which she understands will include CT; surgery by Dr Alycia Rossetti will be 12-24-13.  Patient has felt well overall for past week or longer. She has had no further bleeding, no nausea now and appetite is better, energy mostly good and she is sleeping some better. She has had some aching in low back and  thighs bilaterally since the last episode of vaginal bleeding, without swelling in LE.    She does not have PAC.   ONCOLOGIC HISTORY   Cervical cancer   08/30/2013 Initial Diagnosis Cervical cancer  Patient had no regular medical care in some years, some bleeding with intercourse as well as regular menstrual periods, then increased vaginal discharge with fatigue, lethargy, SOB and flu-like symptoms. She was seen at ED on 08-29-13 with finding of large cervical mass, friable on physical exam and confirmed by [place]. The vaginal discharge had few clue cells, negative for trich and yeast. Pregnancy test was negative. She was seen by Dr Ihor Dow, with cervical biopsy ( [id number]) with invasive adenocarcinoma of cervix, no LVSI noted. She saw Dr Alycia Rossetti on 09-07-13, with exam remarkable for large mass filling vagina ~ 8 cm, free of vaginal sidewalls, cervix ~ 4-5 cm, no parametrial  involvement, for clinical IB2 adenocarcinoma of cervix. After discussion options, decision was made to use definitive chemoradiation. PET showed positive pelvic nodes, with 7.3 x 6.9 cm cervical mass and no uptake chest/liver/skeleton or other. She received radiation from 10-11-13 thru 11-23-13, which was 45 cGy to whole pelvis and additional 9 cGy to PET positive nodes, with sensitizing CDDP x 5 weekly cycles from 10-11-13 thru 11-08-13 (cycle 6 held due to heavy vaginal bleeding).   Review of systems as above, also: No fever or symptoms of infection. Bowels moving adequately. No bladder symptoms. No other bleeding. Remainder of 10 point Review of Systems negative.  Objective:  Vital signs in last 24 hours:  BP 128/76  Pulse 74  Temp(Src) 98.6 F (37 C) (Oral)  Resp 18  Ht 5\' 5"  (1.651 m)  Wt 152 lb 12.8 oz (69.31 kg)  BMI 25.43 kg/m2  SpO2 100%  Alert, oriented and appropriate. Ambulatory without assistance difficulty.  No alopecia  HEENT:PERRL, sclerae not icteric. Oral mucosa moist without lesions, posterior pharynx clear.  Neck supple. No JVD.  Lymphatics:no cervical,suraclavicular or inguinal adenopathy Resp: clear to auscultation bilaterally and normal percussion bilaterally Cardio: regular rate and rhythm. No gallop. GI: soft, nontender, not distended, no mass or organomegaly. Normally active bowel sounds.  Musculoskeletal/ Extremities: without pitting edema, cords, tenderness Neuro: nonfocal  PSYCH normal mood and affect Skin without rash, ecchymosis, petechiae   Lab Results:  Results for orders placed in visit on 12/06/13  CBC WITH DIFFERENTIAL      Result Value Ref  Range   WBC 2.5 (*) 3.9 - 10.3 10e3/uL   NEUT# 1.3 (*) 1.5 - 6.5 10e3/uL   HGB 10.5 (*) 11.6 - 15.9 g/dL   HCT 30.9 (*) 34.8 - 46.6 %   Platelets 267  145 - 400 10e3/uL   MCV 91.3  79.5 - 101.0 fL   MCH 30.9  25.1 - 34.0 pg   MCHC 33.9  31.5 - 36.0 g/dL   RBC 3.39 (*) 3.70 - 5.45 10e6/uL   RDW 20.1  (*) 11.2 - 14.5 %   lymph# 0.7 (*) 0.9 - 3.3 10e3/uL   MONO# 0.5  0.1 - 0.9 10e3/uL   Eosinophils Absolute 0.0  0.0 - 0.5 10e3/uL   Basophils Absolute 0.0  0.0 - 0.1 10e3/uL   NEUT% 50.5  38.4 - 76.8 %   LYMPH% 29.8  14.0 - 49.7 %   MONO% 18.3 (*) 0.0 - 14.0 %   EOS% 1.1  0.0 - 7.0 %   BASO% 0.3  0.0 - 2.0 %  COMPREHENSIVE METABOLIC PANEL (NT61)      Result Value Ref Range   Sodium 141  136 - 145 mEq/L   Potassium 4.1  3.5 - 5.1 mEq/L   Chloride 107  98 - 109 mEq/L   CO2 28  22 - 29 mEq/L   Glucose 78  70 - 140 mg/dl   BUN 10.8  7.0 - 26.0 mg/dL   Creatinine 0.8  0.6 - 1.1 mg/dL   Total Bilirubin 0.21  0.20 - 1.20 mg/dL   Alkaline Phosphatase 71  40 - 150 U/L   AST 19  5 - 34 U/L   ALT 12  0 - 55 U/L   Total Protein 6.5  6.4 - 8.3 g/dL   Albumin 3.6  3.5 - 5.0 g/dL   Calcium 9.1  8.4 - 10.4 mg/dL   Anion Gap 6  3 - 11 mEq/L     Studies/Results:  No results found. CT planned at [facility] next week per patient  Medications: I have reviewed the patient's current medications. Add ferrous fumarate/ hemocyte one daily on empty stomach with OJ  DISCUSSION: discussed adding oral iron now, as this may be helpful for upcoming surgery. She understands that I am glad to see her back at any time if I can be of help.  Assessment/Plan:  1.clinical IB2 adenocarcinoma of cervix, with PET + pelvic nodes (IIIB) in 38 yo lady:  suboptimal response to external beam RT + sensitizing CDDP, now for surgery at [facility] 12-24-13. She has recovered well from the sensitizing chemotherapy. I am glad to see her back at any time if needed. 2. 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  4.recently discontinued tobacco 5.anemia: gyn bleeding, RT and chemo. Add oral ferrous fumarate.  Patient and mother understood discussion and express appreciation for care.   Karen Daniels P, MD   12/06/2013, 2:01 PM

## 2013-12-07 ENCOUNTER — Encounter: Payer: Self-pay | Admitting: Oncology

## 2013-12-07 NOTE — Progress Notes (Signed)
[place] END OF TREATMENT   Name: Karen Daniels Date: 12/07/2013 MRN: [id number] DOB: 03/27/1976   TREATMENT DATES: 10-11-13 thru 11-08-13   REFERRING PHYSICIAN: Nancy Marus  DIAGNOSIS: adenocarcinoma cervix   STAGE AT START OF TREATMENT: clinical IB2 with PET + pelvic nodes    INTENT: curative  DRUGS OR REGIMENS GIVEN: sensitizing weekly cisplatinum   MAJOR TOXICITIES: nausea   REASON TREATMENT STOPPED: recurrent vaginal bleeding, suboptimal response   PERFORMANCE STATUS AT END: 0-1   ONGOING PROBLEMS: persistent cervical tumor   FOLLOW UP PLANS: restaging CT followed by gyn oncology surgery at [facility]

## 2013-12-13 ENCOUNTER — Telehealth: Payer: Self-pay | Admitting: *Deleted

## 2013-12-13 ENCOUNTER — Other Ambulatory Visit: Payer: Self-pay | Admitting: Gynecologic Oncology

## 2013-12-13 DIAGNOSIS — C539 Malignant neoplasm of cervix uteri, unspecified: Secondary | ICD-10-CM

## 2013-12-13 NOTE — Telephone Encounter (Signed)
Notified pt of Scheduled CT scan on 12/17/2013 @ 2:30. Pt will come to GYN Onc to pick up contrast and instruction on 12/14/2013

## 2013-12-17 ENCOUNTER — Ambulatory Visit (HOSPITAL_COMMUNITY)
Admission: RE | Admit: 2013-12-17 | Discharge: 2013-12-17 | Disposition: A | Discharge status: Home / Self Care | Payer: Medicaid Other | Source: Ambulatory Visit | Attending: Gynecologic Oncology | Admitting: Gynecologic Oncology

## 2013-12-17 ENCOUNTER — Encounter (HOSPITAL_COMMUNITY): Payer: Self-pay

## 2013-12-17 DIAGNOSIS — C539 Malignant neoplasm of cervix uteri, unspecified: Secondary | ICD-10-CM | POA: Diagnosis present

## 2013-12-17 MED ORDER — IOHEXOL 300 MG/ML  SOLN
100.0000 mL | Freq: Once | INTRAMUSCULAR | Status: AC | PRN
  Administered 2013-12-17: 100 mL via INTRAVENOUS

## 2013-12-21 ENCOUNTER — Telehealth: Payer: Self-pay | Admitting: Gynecologic Oncology

## 2013-12-21 NOTE — Telephone Encounter (Signed)
LM for patient to call me. CT scan shows improvement in size of pelvic nodes as well as a decrease in the size of her cervical mass. Surgery scheduled in [place]. PG

## 2013-12-24 HISTORY — PX: ABDOMINAL HYSTERECTOMY: SHX81

## 2013-12-27 ENCOUNTER — Ambulatory Visit: Payer: Medicaid Other | Admitting: Radiation Oncology

## 2013-12-27 ENCOUNTER — Encounter: Payer: Self-pay | Admitting: Oncology

## 2013-12-28 ENCOUNTER — Encounter (HOSPITAL_COMMUNITY): Admission: RE | Payer: Self-pay | Source: Ambulatory Visit

## 2013-12-28 ENCOUNTER — Ambulatory Visit (HOSPITAL_COMMUNITY): Admission: RE | Admit: 2013-12-28 | Payer: Medicaid Other | Source: Ambulatory Visit | Admitting: Gynecologic Oncology

## 2013-12-28 SURGERY — ROBOTIC ASSISTED TOTAL HYSTERECTOMY WITH BILATERAL SALPINGO OOPHORECTOMY
Anesthesia: General | Laterality: Bilateral

## 2013-12-31 ENCOUNTER — Other Ambulatory Visit: Payer: Self-pay | Admitting: Gynecologic Oncology

## 2013-12-31 DIAGNOSIS — C539 Malignant neoplasm of cervix uteri, unspecified: Secondary | ICD-10-CM

## 2014-01-01 ENCOUNTER — Encounter (HOSPITAL_COMMUNITY): Payer: Self-pay | Admitting: Emergency Medicine

## 2014-01-01 ENCOUNTER — Emergency Department (HOSPITAL_COMMUNITY)
Admission: EM | Admit: 2014-01-01 | Discharge: 2014-01-01 | Disposition: A | Discharge status: Home / Self Care | Payer: Medicaid Other | Attending: Emergency Medicine | Admitting: Emergency Medicine

## 2014-01-01 DIAGNOSIS — F329 Major depressive disorder, single episode, unspecified: Secondary | ICD-10-CM | POA: Insufficient documentation

## 2014-01-01 DIAGNOSIS — R319 Hematuria, unspecified: Secondary | ICD-10-CM | POA: Diagnosis present

## 2014-01-01 DIAGNOSIS — Z8619 Personal history of other infectious and parasitic diseases: Secondary | ICD-10-CM | POA: Insufficient documentation

## 2014-01-01 DIAGNOSIS — N390 Urinary tract infection, site not specified: Secondary | ICD-10-CM | POA: Diagnosis not present

## 2014-01-01 DIAGNOSIS — R000 Tachycardia, unspecified: Secondary | ICD-10-CM | POA: Insufficient documentation

## 2014-01-01 DIAGNOSIS — Z7901 Long term (current) use of anticoagulants: Secondary | ICD-10-CM | POA: Diagnosis not present

## 2014-01-01 DIAGNOSIS — Z87891 Personal history of nicotine dependence: Secondary | ICD-10-CM | POA: Diagnosis not present

## 2014-01-01 DIAGNOSIS — Z90710 Acquired absence of both cervix and uterus: Secondary | ICD-10-CM | POA: Insufficient documentation

## 2014-01-01 DIAGNOSIS — F3289 Other specified depressive episodes: Secondary | ICD-10-CM | POA: Insufficient documentation

## 2014-01-01 DIAGNOSIS — Z79899 Other long term (current) drug therapy: Secondary | ICD-10-CM | POA: Insufficient documentation

## 2014-01-01 DIAGNOSIS — Z8719 Personal history of other diseases of the digestive system: Secondary | ICD-10-CM | POA: Insufficient documentation

## 2014-01-01 DIAGNOSIS — F411 Generalized anxiety disorder: Secondary | ICD-10-CM | POA: Insufficient documentation

## 2014-01-01 DIAGNOSIS — Z923 Personal history of irradiation: Secondary | ICD-10-CM | POA: Insufficient documentation

## 2014-01-01 DIAGNOSIS — R7401 Elevation of levels of liver transaminase levels: Secondary | ICD-10-CM | POA: Diagnosis not present

## 2014-01-01 DIAGNOSIS — Z90721 Acquired absence of ovaries, unilateral: Secondary | ICD-10-CM

## 2014-01-01 DIAGNOSIS — Z960 Presence of urogenital implants: Secondary | ICD-10-CM

## 2014-01-01 DIAGNOSIS — C539 Malignant neoplasm of cervix uteri, unspecified: Secondary | ICD-10-CM | POA: Insufficient documentation

## 2014-01-01 DIAGNOSIS — R7402 Elevation of levels of lactic acid dehydrogenase (LDH): Secondary | ICD-10-CM | POA: Insufficient documentation

## 2014-01-01 DIAGNOSIS — Z9079 Acquired absence of other genital organ(s): Secondary | ICD-10-CM | POA: Diagnosis not present

## 2014-01-01 DIAGNOSIS — R740 Nonspecific elevation of levels of transaminase and lactic acid dehydrogenase [LDH]: Secondary | ICD-10-CM

## 2014-01-01 DIAGNOSIS — Z978 Presence of other specified devices: Secondary | ICD-10-CM

## 2014-01-01 LAB — CBC WITH DIFFERENTIAL/PLATELET
BASOS PCT: 0 % (ref 0–1)
Basophils Absolute: 0 10*3/uL (ref 0.0–0.1)
EOS ABS: 0.1 10*3/uL (ref 0.0–0.7)
Eosinophils Relative: 2 % (ref 0–5)
HCT: 30.2 % — ABNORMAL LOW (ref 36.0–46.0)
HEMOGLOBIN: 10.6 g/dL — AB (ref 12.0–15.0)
Lymphocytes Relative: 12 % (ref 12–46)
Lymphs Abs: 0.7 10*3/uL (ref 0.7–4.0)
MCH: 31.5 pg (ref 26.0–34.0)
MCHC: 35.1 g/dL (ref 30.0–36.0)
MCV: 89.6 fL (ref 78.0–100.0)
Monocytes Absolute: 0.6 10*3/uL (ref 0.1–1.0)
Monocytes Relative: 10 % (ref 3–12)
NEUTROS ABS: 4.6 10*3/uL (ref 1.7–7.7)
Neutrophils Relative %: 76 % (ref 43–77)
PLATELETS: 321 10*3/uL (ref 150–400)
RBC: 3.37 MIL/uL — AB (ref 3.87–5.11)
RDW: 15.6 % — ABNORMAL HIGH (ref 11.5–15.5)
WBC: 6 10*3/uL (ref 4.0–10.5)

## 2014-01-01 LAB — COMPREHENSIVE METABOLIC PANEL WITH GFR
ALT: 152 U/L — ABNORMAL HIGH (ref 0–35)
AST: 161 U/L — ABNORMAL HIGH (ref 0–37)
Albumin: 3.6 g/dL (ref 3.5–5.2)
Alkaline Phosphatase: 79 U/L (ref 39–117)
Anion gap: 12 (ref 5–15)
BUN: 14 mg/dL (ref 6–23)
CO2: 26 meq/L (ref 19–32)
Calcium: 9.5 mg/dL (ref 8.4–10.5)
Chloride: 99 meq/L (ref 96–112)
Creatinine, Ser: 0.84 mg/dL (ref 0.50–1.10)
GFR calc Af Amer: >90 mL/min
GFR calc non Af Amer: 88 mL/min — ABNORMAL LOW
Glucose, Bld: 110 mg/dL — ABNORMAL HIGH (ref 70–99)
Potassium: 3.9 meq/L (ref 3.7–5.3)
Sodium: 137 meq/L (ref 137–147)
Total Bilirubin: 0.2 mg/dL — ABNORMAL LOW (ref 0.3–1.2)
Total Protein: 7.1 g/dL (ref 6.0–8.3)

## 2014-01-01 LAB — URINALYSIS, ROUTINE W REFLEX MICROSCOPIC
Bilirubin Urine: NEGATIVE
Glucose, UA: NEGATIVE mg/dL
Ketones, ur: NEGATIVE mg/dL
Nitrite: NEGATIVE
Protein, ur: NEGATIVE mg/dL
Specific Gravity, Urine: 1.005 (ref 1.005–1.030)
Urobilinogen, UA: 0.2 mg/dL (ref 0.0–1.0)
pH: 5.5 (ref 5.0–8.0)

## 2014-01-01 LAB — URINE MICROSCOPIC-ADD ON

## 2014-01-01 MED ORDER — CEPHALEXIN 500 MG PO CAPS
500.0000 mg | ORAL_CAPSULE | Freq: Once | ORAL | Status: AC
  Administered 2014-01-01: 500 mg via ORAL
  Filled 2014-01-01: qty 1

## 2014-01-01 MED ORDER — SODIUM CHLORIDE 0.9 % IV BOLUS (SEPSIS)
1000.0000 mL | INTRAVENOUS | Status: AC
  Administered 2014-01-01: 1000 mL via INTRAVENOUS

## 2014-01-01 MED ORDER — CEPHALEXIN 500 MG PO CAPS: 500.0000 mg | ORAL_CAPSULE | Freq: Two times a day (BID) | ORAL | Status: DC

## 2014-01-01 MED ORDER — SODIUM CHLORIDE 0.9 % IV BOLUS (SEPSIS): 1000.0000 mL | Freq: Once | INTRAVENOUS | Status: DC

## 2014-01-01 NOTE — ED Provider Notes (Signed)
CSN: [id number]     Arrival date & time 01/01/14  1416 History   First MD Initiated Contact with Patient 01/01/14 1456     Chief Complaint  Patient presents with  . Hematuria     HPI Karen Daniels is a 38 y.o. female with PMH of adenocarcinoma of the cervix, status post total abdominal colectomy and hysterectomy presenting with hematuria that started one hour prior to presentation. She noted dark urine last night. Patient also notes super pubic pressure and mild burning or pressure in the opening of the urethra. Foul odor to area. No dysuria or pain. She reports cleaning the area frequently. Patient has had a catheter in the bladder for one week post surgery. Patient has no other complaints. No fevers, chills. No chest pain abdominal pain nausea or vomiting. Patient has been walking like normal. Eating and drinking with normal. Patient has appointment with Dr. Ronald Pippins on Monday. Patient's last dose of chemotherapy was August 11.   Past Medical History  Diagnosis Date  . 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 Anxiety   . Depression   . Adenocarcinoma of cervix, stage 1     dx  may 2015   STAGE I B2 (+pet scan pelvic nodes for mets)---  oncologist--  dr Marko Plume and dr Sondra Come--  chemo and external radiation    . GERD (gastroesophageal reflux disease)   . Radiation 10/11/13-11/23/13    whole pelvis 45 gray   Past Surgical History  Procedure Laterality Date  . Wisdom tooth extraction    . Colposcopy    . Diagnostic laparoscopy  07-28-2000    w/ lysis adhesions  . Colonoscopy  07-11-1999  . Abdominal hysterectomy     Family History  Problem Relation Age of Onset  . Heart disease Father   . COPD Father   . Diabetes Father   . Hypertension Father   . Arthritis Father   . Arthritis Mother   . Breast cancer Maternal Aunt   . Diabetes Maternal Grandmother   . Cancer Maternal Grandmother     skin cancer  . Heart disease Maternal Grandmother   . Breast cancer Paternal  Grandmother   . Cancer Maternal Aunt     lung   History  Substance Use Topics  . Smoking status: Former Smoker -- 0.50 packs/day for 2 years    Types: Cigarettes    Quit date: 08/30/2013  . Smokeless tobacco: Never Used  . Alcohol Use: Yes     Comment: occasionally   OB History   Grav Para Term Preterm Abortions TAB SAB Ect Mult Living   4 2 2  2 1 1   2      Review of Systems  Constitutional: Negative for fever and chills.  HENT: Negative for congestion and rhinorrhea.   Eyes: Negative for visual disturbance.  Respiratory: Negative for cough and shortness of breath.   Cardiovascular: Negative for chest pain and palpitations.  Gastrointestinal: Negative for nausea, vomiting and diarrhea.  Genitourinary: Positive for hematuria. Negative for dysuria.  Musculoskeletal: Negative for back pain and gait problem.  Skin: Negative for rash.  Neurological: Negative for weakness and headaches.      Allergies  Review of patient's allergies indicates no known allergies.  Home Medications   Prior to Admission medications   Medication Sig Start Date End Date Taking? Authorizing Provider  acyclovir (ZOVIRAX) 400 MG tablet Take 1 tablet (400 mg total) by mouth 2 (two) times daily. Begin after 10 day  full course dose for suppression of lesions for 1-2 months beyond completition of chemotherapy/RT. 11/05/13  Yes Lennis Marion Downer, MD  docusate sodium (COLACE) 100 MG capsule Take 100 mg by mouth 2 (two) times daily.   Yes Historical Provider, MD  enoxaparin (LOVENOX) 40 MG/0.4ML injection Inject 40 mg into the skin daily. For 26 days, starting 12/27/2013 and ending 01/22/2014.   Yes Historical Provider, MD  ferrous fumarate (HEMOCYTE) 325 (106 FE) MG TABS tablet Take 1 tablet daily on an empty stomachwith OJ 12/06/13  Yes Lennis Marion Downer, MD  HYDROcodone-acetaminophen (NORCO/VICODIN) 5-325 MG per tablet Take 2 tablets by mouth every 4 (four) hours as needed (pain.).   Yes Historical Provider, MD   LORazepam (ATIVAN) 0.5 MG tablet Take 1 -2 tablets by mouth or under the tongue every 6 hours as needed for nausea. 11/15/13  Yes Everitt Amber, MD  ondansetron (ZOFRAN) 8 MG tablet Take one tablet every 8 hours as needed for nausea. Start the 3rd day after chemotherapy. 10/11/13  Yes Lennis Marion Downer, MD  cephALEXin (KEFLEX) 500 MG capsule Take 1 capsule (500 mg total) by mouth 2 (two) times daily. 01/01/14   Pura Spice, PA-C   BP 119/73  Pulse 87  Temp(Src) 98.6 F (37 C) (Oral)  Resp 18  SpO2 98%  LMP 11/16/2013 Physical Exam  Nursing note and vitals reviewed. Constitutional: She appears well-developed and well-nourished. No distress.  HENT:  Head: Normocephalic and atraumatic.  Mouth/Throat: Oropharynx is clear and moist.  Eyes: Conjunctivae and EOM are normal. Right eye exhibits no discharge. Left eye exhibits no discharge.  Cardiovascular: Normal rate, regular rhythm and normal heart sounds.   Tachycardia  Pulmonary/Chest: Effort normal and breath sounds normal. No respiratory distress. She has no wheezes.  Abdominal: Soft. Bowel sounds are normal. She exhibits no distension. There is no tenderness.  Abdominal incision clear dry and intact with appropriate induration and erythema.  Genitourinary:  Catheter in place without surrounding, erythema, lesions or outer tear. Minimal purulent material in the area but not around urethra   Musculoskeletal:  No back tenderness  Neurological: She is alert. She exhibits normal muscle tone. Coordination normal.  Skin: Skin is warm and dry. She is not diaphoretic.    ED Course  Procedures (including critical care time) Labs Review Labs Reviewed  CBC WITH DIFFERENTIAL - Abnormal; Notable for the following:    RBC 3.37 (*)    Hemoglobin 10.6 (*)    HCT 30.2 (*)    RDW 15.6 (*)    All other components within normal limits  COMPREHENSIVE METABOLIC PANEL - Abnormal; Notable for the following:    Glucose, Bld 110 (*)    AST 161 (*)     ALT 152 (*)    Total Bilirubin 0.2 (*)    GFR calc non Af Amer 88 (*)    All other components within normal limits  URINALYSIS, ROUTINE W REFLEX MICROSCOPIC - Abnormal; Notable for the following:    Hgb urine dipstick MODERATE (*)    Leukocytes, UA SMALL (*)    All other components within normal limits  URINE CULTURE  URINE MICROSCOPIC-ADD ON    Imaging Review No results found.   EKG Interpretation None     Meds given in ED:  Medications  sodium chloride 0.9 % bolus 1,000 mL (0 mLs Intravenous Stopped 01/01/14 1659)  cephALEXin (KEFLEX) capsule 500 mg (500 mg Oral Given 01/01/14 1755)    Discharge Medication List as of 01/01/2014  6:14  PM    START taking these medications   Details  cephALEXin (KEFLEX) 500 MG capsule Take 1 capsule (500 mg total) by mouth 2 (two) times daily., Starting 01/01/2014, Until Discontinued, Print          MDM   Final diagnoses:  Adenocarcinoma of cervix, stage 1  S/P hysterectomy with oophorectomy  Hematuria  Foley catheter in place  Transaminasemia  UTI (lower urinary tract infection)   Patient with foley catheter in place for one week and was to remove it yesterday. She developed hematuria today with mild suprapubic tenderness with otherwise NT abdomen. No fevers, chills, no CVA tenderness. She is eating drinking like normal. Incision C/D/I. She does have tachycardia. IV fluids given, tachycardia stabilized. Catheter removed and in and out cath for UA. Patient with some signs of infection. She is symptomatic with prolonged catheter placement. Will treat for UTI. Follow up with surgeon on Monday.  Discussed return precautions with patient. Discussed all results and patient verbalizes understanding and agrees with plan.  This is a shared patient. This patient was discussed with the physician who saw and evaluated the patient and agrees with the plan.      Pura Spice, PA-C 01/02/14 1245

## 2014-01-01 NOTE — ED Notes (Signed)
Patient also used the bathroom on her own and was able to urine

## 2014-01-01 NOTE — ED Notes (Signed)
Will do In and out when fluids have been ran and patient feels like she has to urinate

## 2014-01-01 NOTE — ED Notes (Addendum)
Pt has cervical cancer. Had hysterectomy a week ago and had foley catheter placed. Tried to fit in a follow up appointment yesterday, but was unable, follow-up appointment scheduled for Monday. Noticed urine was dark last night, and then began to have hematuria an hour prior to arrival. No fever in triage, but has elevated HR. Last chemo a month ago.

## 2014-01-01 NOTE — Discharge Instructions (Signed)
Return to the emergency room with worsening of symptoms, new symptoms or with symptoms that are concerning, fevers, nausea, vomiting, unable to keep fluids or solids down.  Please call your doctor for a followup appointment within 24-48 hours. When you talk to your doctor please let them know that you were seen in the emergency department and have them acquire all of your records so that they can discuss the findings with you and formulate a treatment plan to fully care for your new and ongoing problems. You have elevated liver function tests. These may be temporary but follow up with your PCP to see if persistent. Please take all of your antibiotics until finished!   You may develop abdominal discomfort or diarrhea from the antibiotic.  You may help offset this with probiotics which you can buy or get in yogurt. Do not eat  or take the probiotics until 2 hours after your antibiotic.  Follow up with your surgeon on Monday, in two days.

## 2014-01-02 NOTE — ED Provider Notes (Signed)
Medical screening examination/treatment/procedure(s) were conducted as a shared visit with non-physician practitioner(s) and myself.  I personally evaluated the patient during the encounter.   EKG Interpretation None      Will cover for UTI. Foley catheter removed. Well appearing. Urine culture sent. Instructions to return for new or worsening symptoms  Hoy Morn, MD 01/02/14 [phone]

## 2014-01-03 ENCOUNTER — Telehealth: Payer: Self-pay | Admitting: Gynecologic Oncology

## 2014-01-03 ENCOUNTER — Ambulatory Visit: Payer: Medicaid Other

## 2014-01-03 ENCOUNTER — Ambulatory Visit: Payer: Medicaid Other | Attending: Gynecologic Oncology | Admitting: Gynecologic Oncology

## 2014-01-03 ENCOUNTER — Ambulatory Visit (HOSPITAL_BASED_OUTPATIENT_CLINIC_OR_DEPARTMENT_OTHER): Payer: Medicaid Other

## 2014-01-03 VITALS — BP 106/70 | HR 96 | Temp 98.3°F | Resp 20 | Ht 65.0 in | Wt 154.8 lb

## 2014-01-03 DIAGNOSIS — Z808 Family history of malignant neoplasm of other organs or systems: Secondary | ICD-10-CM | POA: Insufficient documentation

## 2014-01-03 DIAGNOSIS — Z90710 Acquired absence of both cervix and uterus: Secondary | ICD-10-CM | POA: Insufficient documentation

## 2014-01-03 DIAGNOSIS — Z79899 Other long term (current) drug therapy: Secondary | ICD-10-CM | POA: Diagnosis not present

## 2014-01-03 DIAGNOSIS — Z923 Personal history of irradiation: Secondary | ICD-10-CM | POA: Insufficient documentation

## 2014-01-03 DIAGNOSIS — Z9079 Acquired absence of other genital organ(s): Secondary | ICD-10-CM | POA: Diagnosis not present

## 2014-01-03 DIAGNOSIS — Z803 Family history of malignant neoplasm of breast: Secondary | ICD-10-CM | POA: Insufficient documentation

## 2014-01-03 DIAGNOSIS — C774 Secondary and unspecified malignant neoplasm of inguinal and lower limb lymph nodes: Secondary | ICD-10-CM | POA: Insufficient documentation

## 2014-01-03 DIAGNOSIS — Z09 Encounter for follow-up examination after completed treatment for conditions other than malignant neoplasm: Secondary | ICD-10-CM | POA: Insufficient documentation

## 2014-01-03 DIAGNOSIS — Z9221 Personal history of antineoplastic chemotherapy: Secondary | ICD-10-CM | POA: Diagnosis not present

## 2014-01-03 DIAGNOSIS — R748 Abnormal levels of other serum enzymes: Secondary | ICD-10-CM

## 2014-01-03 DIAGNOSIS — C539 Malignant neoplasm of cervix uteri, unspecified: Secondary | ICD-10-CM

## 2014-01-03 DIAGNOSIS — Z87891 Personal history of nicotine dependence: Secondary | ICD-10-CM | POA: Diagnosis not present

## 2014-01-03 DIAGNOSIS — Z801 Family history of malignant neoplasm of trachea, bronchus and lung: Secondary | ICD-10-CM | POA: Insufficient documentation

## 2014-01-03 DIAGNOSIS — C775 Secondary and unspecified malignant neoplasm of intrapelvic lymph nodes: Secondary | ICD-10-CM

## 2014-01-03 DIAGNOSIS — K219 Gastro-esophageal reflux disease without esophagitis: Secondary | ICD-10-CM | POA: Insufficient documentation

## 2014-01-03 LAB — COMPREHENSIVE METABOLIC PANEL (CC13)
ALT: 96 U/L — ABNORMAL HIGH (ref 0–55)
ANION GAP: 10 meq/L (ref 3–11)
AST: 67 U/L — ABNORMAL HIGH (ref 5–34)
Albumin: 3.2 g/dL — ABNORMAL LOW (ref 3.5–5.0)
Alkaline Phosphatase: 74 U/L (ref 40–150)
BUN: 12.8 mg/dL (ref 7.0–26.0)
CO2: 25 meq/L (ref 22–29)
CREATININE: 0.7 mg/dL (ref 0.6–1.1)
Calcium: 8.9 mg/dL (ref 8.4–10.4)
Chloride: 107 mEq/L (ref 98–109)
Glucose: 92 mg/dl (ref 70–140)
Potassium: 4.1 mEq/L (ref 3.5–5.1)
Sodium: 141 mEq/L (ref 136–145)
Total Bilirubin: 0.21 mg/dL (ref 0.20–1.20)
Total Protein: 6.6 g/dL (ref 6.4–8.3)

## 2014-01-03 LAB — CBC WITH DIFFERENTIAL/PLATELET
BASO%: 0.5 % (ref 0.0–2.0)
Basophils Absolute: 0 10*3/uL (ref 0.0–0.1)
EOS%: 3.8 % (ref 0.0–7.0)
Eosinophils Absolute: 0.2 10*3/uL (ref 0.0–0.5)
HEMATOCRIT: 29.5 % — AB (ref 34.8–46.6)
HGB: 9.8 g/dL — ABNORMAL LOW (ref 11.6–15.9)
LYMPH%: 14.6 % (ref 14.0–49.7)
MCH: 31.2 pg (ref 25.1–34.0)
MCHC: 33.3 g/dL (ref 31.5–36.0)
MCV: 93.7 fL (ref 79.5–101.0)
MONO#: 0.7 10*3/uL (ref 0.1–0.9)
MONO%: 12.9 % (ref 0.0–14.0)
NEUT#: 3.6 10*3/uL (ref 1.5–6.5)
NEUT%: 68.2 % (ref 38.4–76.8)
PLATELETS: 353 10*3/uL (ref 145–400)
RBC: 3.15 10*6/uL — ABNORMAL LOW (ref 3.70–5.45)
RDW: 17.7 % — ABNORMAL HIGH (ref 11.2–14.5)
WBC: 5.3 10*3/uL (ref 3.9–10.3)
lymph#: 0.8 10*3/uL — ABNORMAL LOW (ref 0.9–3.3)

## 2014-01-03 NOTE — Telephone Encounter (Signed)
Patient informed of Cmet results.  Repeat lab scheduled for Oct 8.  She is to call for any questions or concerns.

## 2014-01-03 NOTE — Patient Instructions (Signed)
We will contact you with the results of your labwork from today.  Please call for any questions or concerns.

## 2014-01-04 ENCOUNTER — Encounter: Payer: Self-pay | Admitting: Gynecologic Oncology

## 2014-01-04 LAB — URINE CULTURE

## 2014-01-04 NOTE — Progress Notes (Signed)
Follow Up Note: Gyn-Onc  Karen Daniels 38 y.o. female  CC:  Chief Complaint  Patient presents with  . Cervical Cancer    Follow up post-op    HPI: Karen Daniels is a 38 y.o. Woman referred by Dr. Ihor Dow in May 2015 for evaluation of a cervical mass.  See below for detailed history.  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     Cervical cancer ([facility])   08/29/2013 Initial Diagnosis Presented to ED with vaginal discharge. Found to have large friable mass, biopsy showed adenocarcinoma of cervix, no LVSI.   09/07/2013 -  Cancer Staged Saw Dr. Alycia Rossetti in [place]. Exam showed 8cm mass filling vagina, no parametrial or sidewall involvement - clinical IB2. PET showed positive pelvic LN.   10/11/2013 - 11/23/2013 Radiation EBRT, total of 45 cGy to whole pelvis and additional 9 cGy to PET positive nodes. Suboptimal response, not candidate for HDR.   10/11/2013 - 11/08/2013 Chemotherapy Sensitizing cisplatin 40 mg/m2 x5 cycles. Cycle 6 held due to heavy vaginal bleeding on 11/09/13, requiring admission to hospital and vaginal packing and transfusion of 3U pRBCs. Exam 11/15/13 showed persistent 5cm bulky, friable mass.  Given her poor response to chemoradiation, with a persistent 5cm friable cervical mass, the decision was made to proceed with completion hysterectomy.  On 12/24/13, she underwent a modified abdominal radical hysterectomy with upper vaginectomy, bilateral salpingo-oophorectomy, bilateral pelvic lymphadenectomy with Dr. Nancy Marus at [facility].  Operative findings included: There was an exophytic 5-6 cm cervical tumor with no evidence of parametrial invasion or obvious metastatic disease. The tubes and ovaries appeared normal bilaterally. The appendix appeared normal.  Final pathology revealed:  Accession #:  [id number] Diagnosis: A: Uterus, cervix, bilateral tubes and ovaries, parametria, radical hysterectomy and upper vaginectomy  Histologic type: Mucinous adenocarcinoma  Histologic grade: well differentiated (see comment) Tumor focality/site: (quadrant(s) of cervix) circumferential  Tumor size: (greatest dimension) 5.0 cm  Extent of invasion: Depth: 1.2 cm Wall thickness: 1.8 cm Percent: 71% Lymphovascular space invasion: present  Parametrial soft tissue involvement: negative  Vaginal cuff involvement: positive for invasive adenocarcinoma from approximately 8 to 10 o'clock  Surgical margins: Invasive: vaginal cuff margins positive from 8 to 10 o'clock  Intraepithelial: not applicable, none seen   Regional lymph nodes (see specimens B and C): Total number involved: 0  Total number examined: 8  Additional pathologic findings:  Endometrium: atrophy Myometrium: small benign leiomyomata  Right ovary: atrophy  Right fallopian tube: no significant pathologic abnormality, no neoplasm identified Left ovary: atrophy  Left fallopian tube: no significant pathologic abnormality, no neoplasm identified   Best block(s) for additional studies: A7.  B: Lymph nodes, right pelvic, regional resection  - No evidence of metastatic disease in five lymph nodes (0/5)   C: Lymph nodes, left pelvic, regional resection  - No evidence of metastatic disease in three lymph nodes (0/3)   Comment: The tumor cells exhibit grade 2 nuclear changes, but some of the atypia may be secondary to radiation effect. Many of the tumor cells are flattened. The infiltrating glands contain abundant mucin.  Interval History:  Karen Daniels presents today with her mother for post-operative follow up, staple removal, and foley removal/ bladder training.  She reports doing well since surgery except for hematuria that developed on Saturday after going to her son's football game.  When  she noted the blood in her urine (foley in  place), she went to the ER.  At the ER, she reports having a UA/culture, labs, and removal of her foley.  She states she has been voiding well since that time and feels she is emptying her bladder.  She has been taking keflex BID prescribed in the ER with no problems/adverse effects.  Appetite good with no nausea or emesis reported.  Bowels functioning without difficulty.  Pain minimal.  Denies vaginal discharge or bleeding.  No issues voiced with her incision.  Reporting elevated liver enzymes with her lab work obtained in the ER.  No other concerns voiced.   Review of Systems Constitutional: Feels well.  No fever, chills, change in appetite, unintentional weight loss or gain.  Cardiovascular: No chest pain, shortness of breath, or edema.  Pulmonary: No cough or wheeze.  Gastrointestinal: No nausea, vomiting, or diarrhea. No bright red blood per rectum or change in bowel movement.  Genitourinary: No frequency, urgency, or dysuria. No vaginal bleeding or discharge.  Musculoskeletal: No myalgia or joint pain. Neurologic: No weakness, numbness, or change in gait.  Psychology: No depression, anxiety, or insomnia.  Current Meds:  Outpatient Encounter Prescriptions as of 01/03/2014  Medication Sig  . acyclovir (ZOVIRAX) 400 MG tablet Take 1 tablet (400 mg total) by mouth 2 (two) times daily. Begin after 10 day full course dose for suppression of lesions for 1-2 months beyond completition of chemotherapy/RT.  Marland Kitchen cephALEXin (KEFLEX) 500 MG capsule Take 1 capsule (500 mg total) by mouth 2 (two) times daily.  Marland Kitchen docusate sodium (COLACE) 100 MG capsule Take 100 mg by mouth 2 (two) times daily.  Marland Kitchen enoxaparin (LOVENOX) 40 MG/0.4ML injection Inject 40 mg into the skin daily. For 26 days, starting 12/27/2013 and ending 01/22/2014.  . ferrous fumarate (HEMOCYTE) 325 (106 FE) MG TABS tablet Take 1 tablet daily on an empty stomachwith OJ  . HYDROcodone-acetaminophen (NORCO/VICODIN) 5-325 MG per tablet Take 2 tablets  by mouth every 4 (four) hours as needed (pain.).  Marland Kitchen LORazepam (ATIVAN) 0.5 MG tablet Take 1 -2 tablets by mouth or under the tongue every 6 hours as needed for nausea.  . ondansetron (ZOFRAN) 8 MG tablet Take one tablet every 8 hours as needed for nausea. Start the 3rd day after chemotherapy.   Medications reviewed with Dr. Denman George.  Patient has been taking acyclovir for the past two months.  Keflex started on 01/01/14.  Allergy: No Known Allergies  Social Hx:   History   Social History  . Marital Status: Divorced    Spouse Name: N/A    Number of Children: 2  . Years of Education: N/A   Occupational History  .     Social History Main Topics  . Smoking status: Former Smoker -- 0.50 packs/day for 2 years    Types: Cigarettes    Quit date: 08/30/2013  . Smokeless tobacco: Never Used  . Alcohol Use: Yes     Comment: occasionally  . Drug Use: No  . Sexual Activity: Yes   Other Topics Concern  . Not on file   Social History Narrative  . No narrative on file    Past Surgical Hx:  Past Surgical History  Procedure Laterality Date  . Wisdom tooth extraction    . Colposcopy    . Diagnostic laparoscopy  07-28-2000    w/ lysis adhesions  . Colonoscopy  07-11-1999  . Abdominal hysterectomy      Past Medical Hx:  Past  Medical History  Diagnosis Date  . 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 Anxiety   . Depression   . Adenocarcinoma of cervix, stage 1     dx  may 2015   STAGE I B2 (+pet scan pelvic nodes for mets)---  oncologist--  dr Marko Plume and dr Sondra Come--  chemo and external radiation    . GERD (gastroesophageal reflux disease)   . Radiation 10/11/13-11/23/13    whole pelvis 45 gray    Family Hx:  Family History  Problem Relation Age of Onset  . Heart disease Father   . COPD Father   . Diabetes Father   . Hypertension Father   . Arthritis Father   . Arthritis Mother   . Breast cancer Maternal Aunt   . Diabetes Maternal Grandmother   . Cancer Maternal  Grandmother     skin cancer  . Heart disease Maternal Grandmother   . Breast cancer Paternal Grandmother   . Cancer Maternal Aunt     lung    Vitals:  Blood pressure 106/70, pulse 96, temperature 98.3 F (36.8 C), temperature source Oral, resp. rate 20, height 5\' 5"  (1.651 m), weight 154 lb 12.8 oz (70.217 kg), last menstrual period 11/16/2013.  Labs: CBC    Component Value Date/Time   WBC 5.3 01/03/2014 1200   WBC 6.0 01/01/2014 1541   RBC 3.15* 01/03/2014 1200   RBC 3.37* 01/01/2014 1541   HGB 9.8* 01/03/2014 1200   HGB 10.6* 01/01/2014 1541   HCT 29.5* 01/03/2014 1200   HCT 30.2* 01/01/2014 1541   PLT 353 01/03/2014 1200   PLT 321 01/01/2014 1541   MCV 93.7 01/03/2014 1200   MCV 89.6 01/01/2014 1541   MCH 31.2 01/03/2014 1200   MCH 31.5 01/01/2014 1541   MCHC 33.3 01/03/2014 1200   MCHC 35.1 01/01/2014 1541   RDW 17.7* 01/03/2014 1200   RDW 15.6* 01/01/2014 1541   LYMPHSABS 0.8* 01/03/2014 1200   LYMPHSABS 0.7 01/01/2014 1541   MONOABS 0.7 01/03/2014 1200   MONOABS 0.6 01/01/2014 1541   EOSABS 0.2 01/03/2014 1200   EOSABS 0.1 01/01/2014 1541   BASOSABS 0.0 01/03/2014 1200   BASOSABS 0.0 01/01/2014 1541   CBC at [facility] on 12/26/13: Hgb 9.6 and Hct 27.4.  CBC from ER on 01/01/14: Hgb 10.6 and Hct 30.2. Bmet last checked at [facility] on 12/26/13.    Cmet from office visit 01/03/14: AST 67 (from 161 in ER on 01/01/14) and ALT 96 (from 152 in ER)  Physical Exam:  General: Well developed, well nourished female in no acute distress. Alert and oriented x 3.  Cardiovascular: Regular rate and rhythm. S1 and S2 normal.  Lungs: Clear to auscultation bilaterally. No wheezes/crackles/rhonchi noted.  Skin: No rashes or lesions present. Back: No CVA tenderness.  Abdomen: Abdomen soft, non-tender and non-obese. Active bowel sounds in all quadrants. No evidence of a fluid wave or abdominal masses.  16 staples removed from the midline incision without difficulty.  1/2 steri strips applied.  No drainage or erythema  noted.  Extremities: No bilateral cyanosis, edema, or clubbing.  I&O cath to evaluate bladder emptying refused at this time.  Assessment/Plan:  38 year old s/p modified abdominal radical hysterectomy with upper vaginectomy, bilateral salpingo-oophorectomy, bilateral pelvic lymphadenectomy with Dr. Nancy Marus at [facility] for a Stage IB2 adenocarcinoma of the cervix.  She is doing well post-operatively.  Reportable signs and symptoms reviewed.  Information reviewed with Dr. Denman George, who agrees with the plan to recheck  liver enzymes before her visit on 01/20/14 with Dr. Alycia Rossetti since her values today show improvement.  Incisional care discussed.  Urine culture pending from recent ER visit.  She will be notified of the results.  She is advised to call for any questions or concerns and to follow up as scheduled or sooner if needed.      CROSS, MELISSA DEAL, NP 01/04/2014, 8:53 AM

## 2014-01-10 ENCOUNTER — Other Ambulatory Visit: Payer: Self-pay | Admitting: *Deleted

## 2014-01-10 MED ORDER — CLARITHROMYCIN 500 MG PO TABS: 1000.0000 mg | ORAL_TABLET | Freq: Once | ORAL | Status: DC

## 2014-01-10 NOTE — Telephone Encounter (Signed)
Pt called regarding incontinence at grocery store today. , Radical Hysterectomy at [facility] on 9/11, reviewed with provider.  Notified pt Rx for Clarithromycin 1000mg  1 PO Once sent to pt pharmacy, appt to see MD on Friday at 1:15pm. Pt verbalized understanding, no concerns at this time.

## 2014-01-13 ENCOUNTER — Encounter: Payer: Self-pay | Admitting: Radiation Oncology

## 2014-01-13 ENCOUNTER — Ambulatory Visit
Admission: RE | Admit: 2014-01-13 | Discharge: 2014-01-13 | Disposition: A | Discharge status: Home / Self Care | Payer: Medicaid Other | Source: Ambulatory Visit | Attending: Radiation Oncology | Admitting: Radiation Oncology

## 2014-01-13 VITALS — BP 98/58 | HR 90 | Temp 98.1°F | Resp 12 | Wt 151.2 lb

## 2014-01-13 DIAGNOSIS — C539 Malignant neoplasm of cervix uteri, unspecified: Secondary | ICD-10-CM

## 2014-01-13 NOTE — Progress Notes (Signed)
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  On September 11 the patient underwent a modified abdominal radical hysterectomy with upper vaginectomy, bilateral salpingo-oophorectomy and bilateral pelvic lymphadenectomy.  A residual 5.0 x 4.5 x 2.5 cm tumor was noted pathologically.  A total of 8 pelvic lymph nodes were recovered at the time of surgery none of which showed metastasis.  the patient's pathology report did show positive surgical margin at the vaginal cuff. Patient is been doing well since her surgery except for some bladder issues. She saw Dr. Alycia Rossetti at [facility] earlier this week concerning this issue. Patient is on no pain medication at this time. The patient is scheduled for additional chemotherapy in the near future. In light of the positive vaginal cuff margin the patient will also proceed with intracavitary radiotherapy treatments, but this treatment not starting until November to allow adequate healing from her recent surgery.                          ALLERGIES:  has No Known Allergies.  Meds: Current Outpatient Prescriptions  Medication Sig Dispense Refill  . acyclovir (ZOVIRAX) 400 MG tablet Take 1 tablet (400 mg total) by mouth 2 (two) times daily. Begin after 10 day full course dose for suppression of lesions for 1-2 months beyond completition of chemotherapy/RT.  60 tablet  1  . docusate sodium (COLACE) 100 MG capsule Take 100 mg by mouth 2 (two) times daily.      Marland Kitchen enoxaparin (LOVENOX) 40 MG/0.4ML injection Inject 40 mg into the skin daily. For 26 days, starting 12/27/2013 and  ending 01/22/2014.      . ferrous fumarate (HEMOCYTE) 325 (106 FE) MG TABS tablet Take 1 tablet daily on an empty stomachwith OJ  30 each  2  . cephALEXin (KEFLEX) 500 MG capsule Take 1 capsule (500 mg total) by mouth 2 (two) times daily.  14 capsule  0  . clarithromycin (BIAXIN) 500 MG tablet Take 2 tablets (1,000 mg total) by mouth once.  2 tablet  0  . HYDROcodone-acetaminophen (NORCO/VICODIN) 5-325 MG per tablet Take 2 tablets by mouth every 4 (four) hours as needed (pain.).      Marland Kitchen LORazepam (ATIVAN) 0.5 MG tablet Take 1 -2 tablets by mouth or under the tongue every 6 hours as needed for nausea.  30 tablet  0  . ondansetron (ZOFRAN) 8 MG tablet Take one tablet every 8 hours as needed for nausea. Start the 3rd day after chemotherapy.  20 tablet  0  . [DISCONTINUED] gabapentin (NEURONTIN) 100 MG capsule Take 100 mg by mouth 4 (four) times daily as needed.      . [DISCONTINUED] norethindrone (MICRONOR,CAMILA,ERRIN) 0.35 MG tablet Take 1 tablet (0.35 mg total) by mouth daily.  1 Package  11   No current facility-administered medications for this encounter.    Physical Findings: The patient is in no acute distress. Patient is alert and oriented.  weight is 151 lb 3.2 oz (68.584 kg). Her oral temperature is 98.1 F (36.7 C). Her blood pressure  is 98/58 and her pulse is 90. Her respiration is 12 and oxygen saturation is 98%. .  No palpable cervical or supraclavicular adenopathy. The lungs are clear to auscultation. The heart has a regular rhythm and rate. The pelvic exam as not performed in light of her recent surgery.  Lab Findings: Lab Results  Component Value Date   WBC 5.3 01/03/2014   HGB 9.8* 01/03/2014   HCT 29.5* 01/03/2014   MCV 93.7 01/03/2014   PLT 353 01/03/2014      Radiographic Findings: Ct Abdomen Pelvis W Contrast  12/17/2013   CLINICAL DATA:  Cervical cancer.  EXAM: CT ABDOMEN AND PELVIS WITH CONTRAST  TECHNIQUE: Multidetector CT imaging of the abdomen and pelvis was performed  using the standard protocol following bolus administration of intravenous contrast.  CONTRAST:  129mL OMNIPAQUE IOHEXOL 300 MG/ML  SOLN  COMPARISON:  09/20/2013  FINDINGS: Lung bases appear clear.  No pleural or pericardial effusion.  No focal liver abnormalities. The gallbladder appears normal. No biliary dilatation. Normal appearance of the pancreas and spleen.  The adrenal glands are both normal. The kidneys are on unremarkable. The urinary bladder appears normal. Cervical mass is identified measuring 5.1 x 4.8. Previously 7.3 x 6.9 cm.  Normal caliber of the abdominal aorta. No adenopathy within the upper abdomen. Previous index left external iliac lymph node measures 5 mm, image 69/ series 2. Previously 10 mm. Previous right external iliac lymph node measures 5 mm, image 71/ series 2. Previously 1 cm. No new or progressive pelvic adenopathy identified. The index right inguinal lymph node measures 8 mm, image 80/series 2. Unchanged from previous exam.  The stomach appears normal. The small bowel loops have a normal course and caliber and there is no obstruction. The appendix is visualized and appears normal. Normal appearance of the colon.  Review of the visualized bony structures is negative for aggressive lytic or sclerotic bone lesion.  IMPRESSION: 1. Decrease in size of cervical mass. 2. Interval improvement in index iliac lymph nodes. 8 mm right inguinal lymph node is unchanged.   Electronically Signed   By: Kerby Moors M.D.   On: 12/17/2013 15:33    Impression: The patient is doing well since her recent surgery and will proceed with systemic chemotherapy in the near future. I discussed with patient that she conceivably could receive her intracavitary brachytherapy treatments in [place] but given the ongoing chemotherapy in [place] she is scheduled to see Dr. Ladona Horns for this issue.  Plan:  Today I discussed the importance of the vaginal dilator as part of her long-term followup. Patient  understands that she would not proceed with this procedure until approved by Dr. Alycia Rossetti. I would be happy to alternate followups with gynecologic oncology, whichever is most convenient for the patient.   ____________________________________ Blair Promise, MD

## 2014-01-13 NOTE — Progress Notes (Signed)
She is currently in no pain. Pt complains of, Loss of Sleep, reports she only sleeps for 3 hrs a nights, reports not taking naps during the day.   Reports urinary frequency, urinary hesitancy and flank pain on right side and Incontinence.  Pt states they urinate 3 - 4 times per night. Reports her urine is yellow without a foul odor.  Pt reports Abdominal Pain-reports she has "gas" pain and having a bowel movement every other day. The patient eats a regular, healthy diet.

## 2014-01-13 NOTE — Progress Notes (Signed)
Surgical pathology exam9/02/2014  [place]  Specimen  Tissue   Result Narrative  Patient:     Karen Daniels, Karen Daniels MRN:         [id number] DOB (Age):   July 26, 1975 (Age: 38) Collected:   12/24/2013 Received:    12/24/2013 Completed:   12/30/2013  Surgical Pathology Report   * Amended *  Accession #:   [id number]  Amendments: Amended:  12/30/2013 by Connye Burkitt. Sharlene Motts Reason: Additional Information Comment: Additional informatin from the GYN disposition conference. Previous Signout Date:  12/28/2013  Diagnosis: A: Uterus, cervix, bilateral tubes and ovaries, parametria, radical hysterectomy and upper vaginectomy   Histologic type:     Mucinous adenocarcinoma                  Histologic grade:     well differentiated (see comment)  Tumor focality/site: (quadrant(s) of cervix)     circumferential   Tumor size: (greatest dimension)     5.0 cm   Extent of invasion:            Depth:     1.2 cm           Wall thickness:     1.8 cm           Percent:     71%  Lymphovascular space invasion:     present   Parametrial soft tissue involvement:     negative   Vaginal cuff involvement:     positive for invasive adenocarcinoma from approximately 8 to 10 o'clock   Surgical margins:           Invasive:     vaginal cuff margins positive from 8 to 10 o'clock       Intraepithelial:     not applicable, none seen   Regional lymph nodes (see specimens B and C):      Total number involved:     0           Total number examined:     8  Additional pathologic findings:      Endometrium:     atrophy Myometrium:     small benign leiomyomata  Right ovary:     atrophy  Right fallopian tube:     no significant pathologic abnormality, no neoplasm identified Left ovary:     atrophy  Left fallopian tube:     no significant pathologic abnormality, no neoplasm identified   Best block(s) for additional studies:     A7.  B: Lymph nodes, right pelvic, regional resection  - No evidence of  metastatic disease in five lymph nodes (0/5)   C: Lymph nodes, left pelvic, regional resection  - No evidence of metastatic disease in three lymph nodes (0/3)   Comment: The tumor cells exhibit grade 2 nuclear changes, but some of the atypia may be secondary to radiation effect. Many of the tumor cells are flattened. The infiltrating glands contain abundant mucin.    Clinical History: Persistent 5 cm friable cervical mass with no parametrial invasion or obvious metastatic disease.  The patient was diagnosed in [place] with a biopsy and the patient was diagnosed with stage IB2 adenocarcinoma of the cervix. A PET scan showed positive lymph nodes. She underwent whole pelvis radiation therapy with suboptimal response.     Gross Description: Received are three appropriately labeled containers.  Container A is additionally labeled "uterus, cervix, bilateral tubes and ovaries, parametria."    Specimen fixation:     formalin  Specimen type:          radical hysterectomy and upper vaginectomy   Adnexa:               present, bilateral, attached   Weight:               160 grams   Shape:               pyriform   Dimensions:                Height: 9.0 cm       Anterior to posterior width: 3.5 cm       Breadth at fundus: 6.0 cm   Serosa:               intact; tan, smooth and glistening   Cervix:      Ectocervix:     completely replaced by tumor       Tumor size:     5.0 x 4.5 x 2.5 cm      Tumor site:           Endocervix:     The mass obliterates the transition zone, extends into the endocervical canal, is 1.2 cm from the anterior and            posterior lower uterine segment, the uninvolved endocervix is white and smooth. The length of the endocervical            canal is 1.7 cm.       Other features: The mass is soft, tan/brown, irregularly shaped and ill-defined, and on cut section has a diffuse microcystic            change of which the cysts are filled with a  clear mucinous material. The mass is 0.6 cm to the blue inked anterior            cervical soft tissue margin, 0.4 cm to the black inked posterior cervical soft tissue margin, is 0.6 cm to the 12            o'clock vaginal cuff margin, 0.7 cm from the 3 o'clock vaginal cuff margin, 1.2 cm to the 6 o'clock vaginal cuff            margin, and 0.5 cm to the 9 o'clock vaginal cuff margin. The left and right lateral parametrial soft tissue is            uninvolved by mass.   Endometrium:      Length of endometrial cavity: 3.5 cm       Width at fundus: 2.2 cm       Endometrial surface: tan and smooth, less than 0.1 cm thick        Other findings: none   Myometrium:       Thickness of wall: 2.5 cm         Other findings: The myometrium is tan, trabeculated, and has three homogeneous white, whorled rubbery nodules up to 0.3  '          cm in greatest dimension.   Adnexa:      Right ovary:           Measurement: 2.2 x 2.1 x 0.9 cm            External surface: unremarkable            Cut surface: unremarkable       Right fallopian tube:  Measurement: 4.5 cm long x 0.5 cm in diameter; The tube is fimbriated and unremarkable.        Left ovary:           Measurement:  2.6 x 2.0 x 1.0 cm            External surface:  gray/tan and smooth            Cut surface:  has two subcortical hemorrhagic simple cysts up to 0.3 cm in greatest dimension       Left fallopian tube:           Measurement: 3.7 cm long x 0.6 cm in diameter; The tube is fimbriated and unremarkable.    Other organs present:  Vagina: tan and smooth, 5.0 cm in diameter and up to 1.3 cm long x 0.1 cm thick   Urinary bladder: n/a   Rectum:     n/a   Digital picture: none   Tissue give to tissue procurement: none   Block summary:   A1     - 12 to 3 o'clock vaginal margin, en face  A2     - 6 o'clock vaginal margin, en face  A3     - 6 to 9 o'clock vaginal margin, en face  A4     - 9 to 12 o'clock vaginal  margin, en face A5,A6     - cervix at 12 o'clock, with mass, full length up through the lower uterine segment, one piece bisected  A7,A8     - full length cervix at 3 o'clock, up through the lower uterine segment with mass, one piece, bisected  A9-A10     - full length section of cervix at 6 o'clock up through the lower uterine segment, one piece, bisected  A11-A12     - cervix at 9 o'clock, full length up to the lower uterine segment, with mass, one piece, bisected  A13-A14     - anterior corpus, full thickness, bisected, with intramural nodule  A15-A16     - posterior corpus, full thickness, bisected, with intramural nodule  A17-A21     - right lateral parametrial soft tissue  A22-A27     - left lateral parametrial soft tissue  A28     - right fimbria, sectioned  A29     - right fallopian tube, cross sections  A30     - right ovary  A31     - left ovary  A32     - left fimbria, trisected A33     - cross sections of left fallopian tube    Container B is additionally labeled "right pelvic lymph nodes." It consists of two fragments of lobulated adipose tissue, 6.1 x 2.0 x 1.0 cm in aggregate that contains five lymph node candidates up to 2.9 cm in greatest dimension.   Block summary:  B1-B4     - each has one lymph node candidate, serially sectioned  B5-B7     - one lymph node, serially sectioned  No tissue remains.   Container C is additionally labeled "left pelvic lymph node." It consists of a 3.7 x 2.6 x 1.2 cm aggregate of adipose tissue that contains three lymph node candidates up to 2.6 cm in greatest dimension.   Block summary:  C1-C4     - each has one lymph node candidate, serially sectioned  No tissue remains.   (sh)    P.A.: Janine Ores, PA (ASCP) gkf//12/28/2013  [facility] Microscopy: [facility]  microscopic examination is performed by Dr. Kelly Splinter.    Signature Attending Pathologist: Carol Ada, MD abk(12/28/2013) I have personally conducted the evaluation of  the above specimens and have rendered the above diagnosis(es).  Electronically Signed by: Carol Ada, MD Date 12/30/2013   Billing Fee Code(s)/CPT Code(s): A; 16606 B; [id number] C; 30160

## 2014-01-14 ENCOUNTER — Ambulatory Visit: Payer: Medicaid Other | Admitting: Gynecologic Oncology

## 2014-01-18 ENCOUNTER — Other Ambulatory Visit: Payer: Self-pay | Admitting: *Deleted

## 2014-01-18 DIAGNOSIS — C539 Malignant neoplasm of cervix uteri, unspecified: Secondary | ICD-10-CM

## 2014-01-20 ENCOUNTER — Telehealth: Payer: Self-pay | Admitting: Gynecologic Oncology

## 2014-01-20 ENCOUNTER — Encounter: Payer: Self-pay | Admitting: Gynecologic Oncology

## 2014-01-20 ENCOUNTER — Other Ambulatory Visit: Payer: Self-pay | Admitting: *Deleted

## 2014-01-20 ENCOUNTER — Other Ambulatory Visit: Payer: Medicaid Other

## 2014-01-20 ENCOUNTER — Ambulatory Visit: Payer: Medicaid Other | Attending: Gynecologic Oncology | Admitting: Gynecologic Oncology

## 2014-01-20 ENCOUNTER — Other Ambulatory Visit (HOSPITAL_BASED_OUTPATIENT_CLINIC_OR_DEPARTMENT_OTHER): Payer: Medicaid Other

## 2014-01-20 VITALS — BP 115/67 | HR 97 | Temp 98.7°F | Resp 16 | Ht 65.0 in | Wt 156.2 lb

## 2014-01-20 DIAGNOSIS — C775 Secondary and unspecified malignant neoplasm of intrapelvic lymph nodes: Secondary | ICD-10-CM | POA: Insufficient documentation

## 2014-01-20 DIAGNOSIS — C539 Malignant neoplasm of cervix uteri, unspecified: Secondary | ICD-10-CM

## 2014-01-20 DIAGNOSIS — Z87891 Personal history of nicotine dependence: Secondary | ICD-10-CM | POA: Insufficient documentation

## 2014-01-20 DIAGNOSIS — R748 Abnormal levels of other serum enzymes: Secondary | ICD-10-CM

## 2014-01-20 DIAGNOSIS — Z79899 Other long term (current) drug therapy: Secondary | ICD-10-CM | POA: Diagnosis not present

## 2014-01-20 LAB — COMPREHENSIVE METABOLIC PANEL (CC13)
ALT: 20 U/L (ref 0–55)
ANION GAP: 5 meq/L (ref 3–11)
AST: 19 U/L (ref 5–34)
Albumin: 3.1 g/dL — ABNORMAL LOW (ref 3.5–5.0)
Alkaline Phosphatase: 90 U/L (ref 40–150)
BUN: 10.5 mg/dL (ref 7.0–26.0)
CALCIUM: 9.5 mg/dL (ref 8.4–10.4)
CO2: 30 meq/L — AB (ref 22–29)
Chloride: 105 mEq/L (ref 98–109)
Creatinine: 0.8 mg/dL (ref 0.6–1.1)
Glucose: 115 mg/dl (ref 70–140)
Potassium: 4.5 mEq/L (ref 3.5–5.1)
SODIUM: 141 meq/L (ref 136–145)
Total Bilirubin: 0.23 mg/dL (ref 0.20–1.20)
Total Protein: 6.5 g/dL (ref 6.4–8.3)

## 2014-01-20 LAB — CBC WITH DIFFERENTIAL/PLATELET
BASO%: 0.4 % (ref 0.0–2.0)
Basophils Absolute: 0 10*3/uL (ref 0.0–0.1)
EOS%: 3.4 % (ref 0.0–7.0)
Eosinophils Absolute: 0.2 10*3/uL (ref 0.0–0.5)
HCT: 32.2 % — ABNORMAL LOW (ref 34.8–46.6)
HGB: 10.7 g/dL — ABNORMAL LOW (ref 11.6–15.9)
LYMPH#: 0.7 10*3/uL — AB (ref 0.9–3.3)
LYMPH%: 14.6 % (ref 14.0–49.7)
MCH: 31 pg (ref 25.1–34.0)
MCHC: 33.2 g/dL (ref 31.5–36.0)
MCV: 93.3 fL (ref 79.5–101.0)
MONO#: 0.5 10*3/uL (ref 0.1–0.9)
MONO%: 11.2 % (ref 0.0–14.0)
NEUT#: 3.4 10*3/uL (ref 1.5–6.5)
NEUT%: 70.4 % (ref 38.4–76.8)
Platelets: 306 10*3/uL (ref 145–400)
RBC: 3.45 10*6/uL — ABNORMAL LOW (ref 3.70–5.45)
RDW: 14.1 % (ref 11.2–14.5)
WBC: 4.9 10*3/uL (ref 3.9–10.3)

## 2014-01-20 NOTE — Progress Notes (Signed)
. Consult Note: Gyn-Onc  Karen Daniels 38 y.o. female  CC:  Chief Complaint  Patient presents with  . adnocarcinoma    HPI: CC:  Chief Complaint   Patient presents with   .  Follow-up     HISTORY: Karen Daniels is a 38 y.o. year old woman who presented with a history of Stage IB2 adenocarcinoma of the cervix with the following tumor history: Oncology History    Clinical Stage IB2 adenocarcinoma of the cervix with metastasis to pelvic lymph nodes.     Cervical cancer (RAF-HCC)    08/29/2013  Initial Diagnosis  Presented to ED with vaginal discharge. Found to have large friable mass, biopsy showed adenocarcinoma of cervix, no LVSI.    09/07/2013 -  Cancer Staged  Saw Dr. Alycia Rossetti in [place]. Exam showed 8cm mass fillign vagina, no parametrial or sidewall involvement - clinical IB2. PET showed positive pelvic LN.    10/11/2013 - 11/23/2013  Radiation  EBRT, total of 45 cGy to whole pelvis and additional 9 cGy to PET positive nodes. Suboptimal response, not candidate for HDR.    10/11/2013 - 11/08/2013  Chemotherapy  Sensitizing cisplatin 40 mg/m2 x5 cycles. Cycle 6 held due to heavy vaginal bleeding on 11/09/13, requiring admission to hospital and vaginal packing and transfusion of 3U pRBCs. Exam 11/15/13 showed persistent 5cm bulky, friable mass.    Given her poor response to chemoradiation, with a persistent 5cm friable cervical mass, the decision was made to proceed with completion hysterectomy.   INTERVAL HISTORY: Procedure: 12/24/13 - Abdominal modified radical hysterectomy, bilateral salpingo-oophorectomy, bilateral pelvic lymphadenectomy.  Intraoperative Findings: There was an exophytic 5-6 cm cervical tumor with no evidence of parametrial invasion or obvious metastatic disease. The tubes and ovaries appeared normal bilaterally. The appendix appeared normal.   Pathology: Accession #: [id number]  Diagnosis: A: Uterus, cervix, bilateral tubes and ovaries, parametria, radical  hysterectomy and upper vaginectomy   Histologic type: Mucinous adenocarcinoma   Histologic grade: well differentiated (see comment)  Tumor focality/site: (quadrant(s) of cervix) circumferential   Tumor size: (greatest dimension) 5.0 cm   Extent of invasion:  Depth: 1.2 cm Wall thickness: 1.8 cm Percent: 71%  Lymphovascular space invasion: present   Parametrial soft tissue involvement: negative   Vaginal cuff involvement: positive for invasive adenocarcinoma from approximately 8 to 10 o'clock   Surgical margins:  Invasive: vaginal cuff margins positive from 8 to 10 o'clock  Intraepithelial: not applicable, none seen   Regional lymph nodes (see specimens B and C): Total number involved: 0  Total number examined: 8  Additional pathologic findings:  Endometrium: atrophy Myometrium: small benign leiomyomata  Right ovary: atrophy  Right fallopian tube: no significant pathologic abnormality, no neoplasm identified Left ovary: atrophy  Left fallopian tube: no significant pathologic abnormality, no neoplasm identified   B: Lymph nodes, right pelvic, regional resection  - No evidence of metastatic disease in five lymph nodes (0/5)   C: Lymph nodes, left pelvic, regional resection  - No evidence of metastatic disease in three lymph nodes (0/3)   Comment: The tumor cells exhibit grade 2 nuclear changes, but some of the atypia may be secondary to radiation effect. Many of the tumor cells are flattened. The infiltrating glands contain abundant mucin.  Stage/Disposition: Karen Daniels is a 38 y.o. 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 Disposition to vaginal brachytherapy and consideration for further treatment with Carbo/Taxol after completing radiation treatment.  Interval History:  I saw her at  [facility] on September 22. She was complaining of significant significant amount of fluid coming to the vagina. Both the methylene blue and protein tests were  negative. Since that time she states the drainage is was completely abated and she's feeling very well. She scheduled for cycle #1 of paclitaxel and carboplatin on Monday.  Review of Systems  Current Meds:  Outpatient Encounter Prescriptions as of 01/20/2014  Medication Sig  . acyclovir (ZOVIRAX) 400 MG tablet Take 1 tablet (400 mg total) by mouth 2 (two) times daily. Begin after 10 day full course dose for suppression of lesions for 1-2 months beyond completition of chemotherapy/RT.  Marland Kitchen docusate sodium (COLACE) 100 MG capsule Take 100 mg by mouth 2 (two) times daily.  Marland Kitchen enoxaparin (LOVENOX) 40 MG/0.4ML injection Inject 40 mg into the skin daily. For 26 days, starting 12/27/2013 and ending 01/22/2014.  Marland Kitchen LORazepam (ATIVAN) 0.5 MG tablet Take 1 -2 tablets by mouth or under the tongue every 6 hours as needed for nausea.  . [DISCONTINUED] enoxaparin (LOVENOX) 40 MG/0.4ML injection Inject 40 mg into the skin.  . ferrous fumarate (HEMOCYTE) 325 (106 FE) MG TABS tablet Take 1 tablet daily on an empty stomachwith OJ  . HYDROcodone-acetaminophen (NORCO/VICODIN) 5-325 MG per tablet Take 2 tablets by mouth every 4 (four) hours as needed (pain.).  Marland Kitchen ondansetron (ZOFRAN) 8 MG tablet Take one tablet every 8 hours as needed for nausea. Start the 3rd day after chemotherapy.  . [DISCONTINUED] cephALEXin (KEFLEX) 500 MG capsule Take 1 capsule (500 mg total) by mouth 2 (two) times daily.  . [DISCONTINUED] clarithromycin (BIAXIN) 500 MG tablet Take 2 tablets (1,000 mg total) by mouth once.    Allergy: No Known Allergies  Social Hx:   History   Social History  . Marital Status: Divorced    Spouse Name: N/A    Number of Children: 2  . Years of Education: N/A   Occupational History  .     Social History Main Topics  . Smoking status: Former Smoker -- 0.50 packs/day for 2 years    Types: Cigarettes    Quit date: 08/30/2013  . Smokeless tobacco: Never Used  . Alcohol Use: Yes     Comment: occasionally   . Drug Use: No  . Sexual Activity: Yes   Other Topics Concern  . Not on file   Social History Narrative  . No narrative on file    Past Surgical Hx:  Past Surgical History  Procedure Laterality Date  . Wisdom tooth extraction    . Colposcopy    . Diagnostic laparoscopy  07-28-2000    w/ lysis adhesions  . Colonoscopy  07-11-1999  . Abdominal hysterectomy      Past Medical Hx:  Past Medical History  Diagnosis Date  . 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 Anxiety   . Depression   . Adenocarcinoma of cervix, stage 1     dx  may 2015   STAGE I B2 (+pet scan pelvic nodes for mets)---  oncologist--  dr Marko Plume and dr Sondra Come--  chemo and external radiation    . GERD (gastroesophageal reflux disease)   . Radiation 10/11/13-11/23/13    whole pelvis 45 gray    Oncology Hx:    Cervical cancer   08/30/2013 Initial Diagnosis Cervical cancer   09/07/2013 Cancer Staging IB2   10/11/2013 - 11/23/2013 Radiation Therapy 35 cGy to pelvis and an additional 9 cGy a PET positive nodes.   10/11/2013 - 11/08/2013 Chemotherapy 5  cycles of sensitizing cisplatin 40 mg per meter squared   12/24/2013 Surgery Abdominal modified radical hysterectomy, bilateral salpingo-oophorectomy, bilateral pelvic lymphadenectomy.   01/20/2014 -  Chemotherapy Paclitaxel and carboplatin x3. Vaginal cuff brachy therapy.    Family Hx:  Family History  Problem Relation Age of Onset  . Heart disease Father   . COPD Father   . Diabetes Father   . Hypertension Father   . Arthritis Father   . Arthritis Mother   . Breast cancer Maternal Aunt   . Diabetes Maternal Grandmother   . Cancer Maternal Grandmother     skin cancer  . Heart disease Maternal Grandmother   . Breast cancer Paternal Grandmother   . Cancer Maternal Aunt     lung    Vitals:  Blood pressure 115/67, pulse 97, temperature 98.7 F (37.1 C), temperature source Oral, resp. rate 16, height 5\' 5"  (1.651 m), weight 156 lb 3.2 oz (70.852  kg).  Physical Exam: Well-nourished well-developed female in no acute distress.  Abdomen: Well-healed vertical midline incision. Abdomen is soft nontender nondistended. No palpable masses. No evidence of any incisional hernias.  Pelvic: Normal female external genitalia. The vaginal cuff is closed. There is no fluid in the vagina. Bimanual examination reveals no masses, nodularity, fluctuance, tenderness.  Assessment/Plan:  Karen Daniels 38 y.o. 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 She was initially started therapy with definitive chemoradiation but had a persistent large cervical mass. She therefore underwent a modified radical hysterectomy and pelvic lymph node dissection. Her lymph nodes were positive on PET scan prior to radiation but those were sterilized and were negative on final pathology. She had a large amount of residual tumor and a positive microscopic vaginal margin. She therefore will undergo 3 cycles of paclitaxel and carboplatin. She's also scheduled to see radiation oncology for vaginal cuff brachii therapy. The patient is electing to do this therapy at [facility]. She scheduled for her to therapy on October 12. We'll check chemistries today in order to write her orders.  Adalie Mand A., MD 01/20/2014, 1:27 PM

## 2014-01-20 NOTE — Telephone Encounter (Signed)
Attempted to call patient about lab results.  Will retry at a later time.

## 2014-01-27 ENCOUNTER — Other Ambulatory Visit: Payer: Medicaid Other

## 2014-01-27 ENCOUNTER — Telehealth: Payer: Self-pay | Admitting: Gynecologic Oncology

## 2014-01-27 NOTE — Telephone Encounter (Signed)
Message left asking the patient to call the office to discuss her labs

## 2014-01-31 ENCOUNTER — Telehealth: Payer: Self-pay | Admitting: *Deleted

## 2014-01-31 NOTE — Telephone Encounter (Signed)
On 01-31-14 fax medical records to [place] [facility] at [employer].

## 2014-02-03 ENCOUNTER — Other Ambulatory Visit: Payer: Medicaid Other

## 2014-02-03 DIAGNOSIS — C539 Malignant neoplasm of cervix uteri, unspecified: Secondary | ICD-10-CM

## 2014-02-03 LAB — CBC WITH DIFFERENTIAL/PLATELET
BASO%: 0.5 % (ref 0.0–2.0)
Basophils Absolute: 0 10*3/uL (ref 0.0–0.1)
EOS%: 4.3 % (ref 0.0–7.0)
Eosinophils Absolute: 0.1 10*3/uL (ref 0.0–0.5)
HEMATOCRIT: 28 % — AB (ref 34.8–46.6)
HGB: 9.7 g/dL — ABNORMAL LOW (ref 11.6–15.9)
LYMPH%: 28.3 % (ref 14.0–49.7)
MCH: 32.2 pg (ref 25.1–34.0)
MCHC: 34.5 g/dL (ref 31.5–36.0)
MCV: 93.3 fL (ref 79.5–101.0)
MONO#: 0.3 10*3/uL (ref 0.1–0.9)
MONO%: 19.1 % — AB (ref 0.0–14.0)
NEUT#: 0.7 10*3/uL — ABNORMAL LOW (ref 1.5–6.5)
NEUT%: 47.8 % (ref 38.4–76.8)
PLATELETS: 205 10*3/uL (ref 145–400)
RBC: 3 10*6/uL — ABNORMAL LOW (ref 3.70–5.45)
RDW: 13.1 % (ref 11.2–14.5)
WBC: 1.4 10*3/uL — ABNORMAL LOW (ref 3.9–10.3)
lymph#: 0.4 10*3/uL — ABNORMAL LOW (ref 0.9–3.3)

## 2014-02-10 ENCOUNTER — Other Ambulatory Visit: Payer: Medicaid Other

## 2014-02-10 DIAGNOSIS — C539 Malignant neoplasm of cervix uteri, unspecified: Secondary | ICD-10-CM

## 2014-02-10 LAB — CBC WITH DIFFERENTIAL/PLATELET
BASO%: 1 % (ref 0.0–2.0)
Basophils Absolute: 0 10*3/uL (ref 0.0–0.1)
EOS%: 1 % (ref 0.0–7.0)
Eosinophils Absolute: 0 10*3/uL (ref 0.0–0.5)
HCT: 27.2 % — ABNORMAL LOW (ref 34.8–46.6)
HGB: 9.3 g/dL — ABNORMAL LOW (ref 11.6–15.9)
LYMPH%: 29 % (ref 14.0–49.7)
MCH: 31.4 pg (ref 25.1–34.0)
MCHC: 34.2 g/dL (ref 31.5–36.0)
MCV: 91.9 fL (ref 79.5–101.0)
MONO#: 0.5 10*3/uL (ref 0.1–0.9)
MONO%: 26.9 % — AB (ref 0.0–14.0)
NEUT#: 0.8 10*3/uL — ABNORMAL LOW (ref 1.5–6.5)
NEUT%: 42.1 % (ref 38.4–76.8)
PLATELETS: 128 10*3/uL — AB (ref 145–400)
RBC: 2.96 10*6/uL — AB (ref 3.70–5.45)
RDW: 12.9 % (ref 11.2–14.5)
WBC: 1.9 10*3/uL — ABNORMAL LOW (ref 3.9–10.3)
lymph#: 0.6 10*3/uL — ABNORMAL LOW (ref 0.9–3.3)

## 2014-02-10 LAB — COMPREHENSIVE METABOLIC PANEL (CC13)
ALT: 27 U/L (ref 0–55)
AST: 28 U/L (ref 5–34)
Albumin: 3 g/dL — ABNORMAL LOW (ref 3.5–5.0)
Alkaline Phosphatase: 101 U/L (ref 40–150)
Anion Gap: 5 mEq/L (ref 3–11)
BUN: 11.1 mg/dL (ref 7.0–26.0)
CALCIUM: 9 mg/dL (ref 8.4–10.4)
CHLORIDE: 107 meq/L (ref 98–109)
CO2: 28 mEq/L (ref 22–29)
CREATININE: 0.8 mg/dL (ref 0.6–1.1)
Glucose: 98 mg/dl (ref 70–140)
POTASSIUM: 4.2 meq/L (ref 3.5–5.1)
Sodium: 141 mEq/L (ref 136–145)
Total Bilirubin: <0.2 mg/dL (ref 0.20–1.20)
Total Protein: 6 g/dL — ABNORMAL LOW (ref 6.4–8.3)

## 2014-02-10 LAB — TECHNOLOGIST REVIEW

## 2014-02-14 ENCOUNTER — Encounter: Payer: Self-pay | Admitting: Gynecologic Oncology

## 2014-02-24 ENCOUNTER — Other Ambulatory Visit: Payer: Self-pay | Admitting: Gynecologic Oncology

## 2014-02-24 DIAGNOSIS — C539 Malignant neoplasm of cervix uteri, unspecified: Secondary | ICD-10-CM

## 2014-03-03 ENCOUNTER — Other Ambulatory Visit: Payer: Medicaid Other

## 2014-03-03 DIAGNOSIS — C539 Malignant neoplasm of cervix uteri, unspecified: Secondary | ICD-10-CM

## 2014-03-03 LAB — CBC WITH DIFFERENTIAL/PLATELET
BASO%: 0.4 % (ref 0.0–2.0)
BASOS ABS: 0 10*3/uL (ref 0.0–0.1)
EOS ABS: 0 10*3/uL (ref 0.0–0.5)
EOS%: 1.9 % (ref 0.0–7.0)
HEMATOCRIT: 27.3 % — AB (ref 34.8–46.6)
HGB: 9.1 g/dL — ABNORMAL LOW (ref 11.6–15.9)
LYMPH%: 33.6 % (ref 14.0–49.7)
MCH: 30.3 pg (ref 25.1–34.0)
MCHC: 33.4 g/dL (ref 31.5–36.0)
MCV: 90.7 fL (ref 79.5–101.0)
MONO#: 0.4 10*3/uL (ref 0.1–0.9)
MONO%: 33.3 % — AB (ref 0.0–14.0)
NEUT%: 30.8 % — AB (ref 38.4–76.8)
NEUTROS ABS: 0.4 10*3/uL — AB (ref 1.5–6.5)
PLATELETS: 72 10*3/uL — AB (ref 145–400)
RBC: 3.01 10*6/uL — ABNORMAL LOW (ref 3.70–5.45)
RDW: 13.6 % (ref 11.2–14.5)
WBC: 1.2 10*3/uL — AB (ref 3.9–10.3)
lymph#: 0.4 10*3/uL — ABNORMAL LOW (ref 0.9–3.3)

## 2014-03-03 LAB — COMPREHENSIVE METABOLIC PANEL (CC13)
ALK PHOS: 84 U/L (ref 40–150)
ALT: 30 U/L (ref 0–55)
ANION GAP: 7 meq/L (ref 3–11)
AST: 26 U/L (ref 5–34)
Albumin: 3.4 g/dL — ABNORMAL LOW (ref 3.5–5.0)
BILIRUBIN TOTAL: 0.36 mg/dL (ref 0.20–1.20)
BUN: 9.6 mg/dL (ref 7.0–26.0)
CO2: 28 meq/L (ref 22–29)
CREATININE: 0.8 mg/dL (ref 0.6–1.1)
Calcium: 9.4 mg/dL (ref 8.4–10.4)
Chloride: 105 mEq/L (ref 98–109)
GLUCOSE: 93 mg/dL (ref 70–140)
Potassium: 4.1 mEq/L (ref 3.5–5.1)
Sodium: 140 mEq/L (ref 136–145)
Total Protein: 6.8 g/dL (ref 6.4–8.3)

## 2014-03-04 ENCOUNTER — Telehealth: Payer: Self-pay | Admitting: *Deleted

## 2014-03-04 NOTE — Telephone Encounter (Signed)
error 

## 2014-03-21 ENCOUNTER — Ambulatory Visit: Payer: Medicaid Other | Attending: Gynecologic Oncology | Admitting: Gynecologic Oncology

## 2014-03-21 ENCOUNTER — Telehealth: Payer: Self-pay | Admitting: Gynecologic Oncology

## 2014-03-21 ENCOUNTER — Encounter: Payer: Self-pay | Admitting: Gynecologic Oncology

## 2014-03-21 ENCOUNTER — Ambulatory Visit (HOSPITAL_BASED_OUTPATIENT_CLINIC_OR_DEPARTMENT_OTHER): Payer: Medicaid Other

## 2014-03-21 VITALS — BP 129/88 | HR 107 | Temp 98.2°F | Resp 16 | Ht 65.0 in | Wt 154.5 lb

## 2014-03-21 DIAGNOSIS — Z9079 Acquired absence of other genital organ(s): Secondary | ICD-10-CM | POA: Insufficient documentation

## 2014-03-21 DIAGNOSIS — Z90710 Acquired absence of both cervix and uterus: Secondary | ICD-10-CM | POA: Diagnosis not present

## 2014-03-21 DIAGNOSIS — Z792 Long term (current) use of antibiotics: Secondary | ICD-10-CM | POA: Diagnosis not present

## 2014-03-21 DIAGNOSIS — R05 Cough: Secondary | ICD-10-CM | POA: Diagnosis not present

## 2014-03-21 DIAGNOSIS — Z7952 Long term (current) use of systemic steroids: Secondary | ICD-10-CM | POA: Insufficient documentation

## 2014-03-21 DIAGNOSIS — J069 Acute upper respiratory infection, unspecified: Secondary | ICD-10-CM

## 2014-03-21 DIAGNOSIS — Z9221 Personal history of antineoplastic chemotherapy: Secondary | ICD-10-CM | POA: Diagnosis not present

## 2014-03-21 DIAGNOSIS — Z923 Personal history of irradiation: Secondary | ICD-10-CM | POA: Insufficient documentation

## 2014-03-21 DIAGNOSIS — J029 Acute pharyngitis, unspecified: Secondary | ICD-10-CM | POA: Insufficient documentation

## 2014-03-21 DIAGNOSIS — Z8541 Personal history of malignant neoplasm of cervix uteri: Secondary | ICD-10-CM | POA: Diagnosis not present

## 2014-03-21 DIAGNOSIS — Z87891 Personal history of nicotine dependence: Secondary | ICD-10-CM | POA: Insufficient documentation

## 2014-03-21 DIAGNOSIS — Z90722 Acquired absence of ovaries, bilateral: Secondary | ICD-10-CM | POA: Diagnosis not present

## 2014-03-21 DIAGNOSIS — B009 Herpesviral infection, unspecified: Secondary | ICD-10-CM | POA: Diagnosis not present

## 2014-03-21 LAB — CBC WITH DIFFERENTIAL/PLATELET
BASO%: 0.6 % (ref 0.0–2.0)
Basophils Absolute: 0 10*3/uL (ref 0.0–0.1)
EOS ABS: 0 10*3/uL (ref 0.0–0.5)
EOS%: 3 % (ref 0.0–7.0)
HEMATOCRIT: 24.1 % — AB (ref 34.8–46.6)
HEMOGLOBIN: 8.2 g/dL — AB (ref 11.6–15.9)
LYMPH%: 44.3 % (ref 14.0–49.7)
MCH: 30.6 pg (ref 25.1–34.0)
MCHC: 33.8 g/dL (ref 31.5–36.0)
MCV: 90.5 fL (ref 79.5–101.0)
MONO#: 0.1 10*3/uL (ref 0.1–0.9)
MONO%: 13.4 % (ref 0.0–14.0)
NEUT%: 38.7 % (ref 38.4–76.8)
NEUTROS ABS: 0.3 10*3/uL — AB (ref 1.5–6.5)
PLATELETS: 154 10*3/uL (ref 145–400)
RBC: 2.67 10*6/uL — ABNORMAL LOW (ref 3.70–5.45)
RDW: 15.8 % — ABNORMAL HIGH (ref 11.2–14.5)
WBC: 0.8 10*3/uL — AB (ref 3.9–10.3)
lymph#: 0.4 10*3/uL — ABNORMAL LOW (ref 0.9–3.3)

## 2014-03-21 LAB — COMPREHENSIVE METABOLIC PANEL (CC13)
ALT: 25 U/L (ref 0–55)
ANION GAP: 11 meq/L (ref 3–11)
AST: 27 U/L (ref 5–34)
Albumin: 3.4 g/dL — ABNORMAL LOW (ref 3.5–5.0)
Alkaline Phosphatase: 86 U/L (ref 40–150)
BUN: 12.5 mg/dL (ref 7.0–26.0)
CALCIUM: 8.8 mg/dL (ref 8.4–10.4)
CHLORIDE: 98 meq/L (ref 98–109)
CO2: 29 meq/L (ref 22–29)
CREATININE: 0.8 mg/dL (ref 0.6–1.1)
Glucose: 99 mg/dl (ref 70–140)
Potassium: 3.1 mEq/L — ABNORMAL LOW (ref 3.5–5.1)
Sodium: 138 mEq/L (ref 136–145)
TOTAL PROTEIN: 6.6 g/dL (ref 6.4–8.3)
Total Bilirubin: 0.27 mg/dL (ref 0.20–1.20)

## 2014-03-21 MED ORDER — AMOXICILLIN-POT CLAVULANATE 875-125 MG PO TABS: 1.0000 | ORAL_TABLET | Freq: Two times a day (BID) | ORAL | Status: DC

## 2014-03-21 NOTE — Telephone Encounter (Signed)
Spoke with Lyn, RN at [facility].  Called patient to assess symptoms.  Reporting a "horrible sore throat, really bad cough, coughing up a little bit of stuff in the am, dry cough at night.  Chest feels heavy and back hurts."  Denies fever.  Sputum reported as yellow.  Appt made for this afternoon for evaluation.

## 2014-03-21 NOTE — Patient Instructions (Addendum)
Augmentin twice daily for seven days.  Complete full course of antibiotics.  Begin neutropenic precautions and return to [facility] for labs on Thursday, Dec 10.  Please call if symptoms worsen or persist between that time.  Please call for any questions or concerns.  Amoxicillin; Clavulanic Acid tablets What is this medicine? AMOXICILLIN; CLAVULANIC ACID (a mox i SIL in; KLAV yoo lan ic AS id) is a penicillin antibiotic. It is used to treat certain kinds of bacterial infections. It will not work for colds, flu, or other viral infections. This medicine may be used for other purposes; ask your health care provider or pharmacist if you have questions. COMMON BRAND NAME(S): Augmentin What should I tell my health care provider before I take this medicine? They need to know if you have any of these conditions: -bowel disease, like colitis -kidney disease -liver disease -mononucleosis -an unusual or allergic reaction to amoxicillin, penicillin, cephalosporin, other antibiotics, clavulanic acid, other medicines, foods, dyes, or preservatives -pregnant or trying to get pregnant -breast-feeding How should I use this medicine? Take this medicine by mouth with a full glass of water. Follow the directions on the prescription label. Take at the start of a meal. Do not crush or chew. If the tablet has a score line, you may cut it in half at the score line for easier swallowing. Take your medicine at regular intervals. Do not take your medicine more often than directed. Take all of your medicine as directed even if you think you are better. Do not skip doses or stop your medicine early. Talk to your pediatrician regarding the use of this medicine in children. Special care may be needed. Overdosage: If you think you have taken too much of this medicine contact a poison control center or emergency room at once. NOTE: This medicine is only for you. Do not share this medicine with others. What if I miss a  dose? If you miss a dose, take it as soon as you can. If it is almost time for your next dose, take only that dose. Do not take double or extra doses. What may interact with this medicine? -allopurinol -anticoagulants -birth control pills -methotrexate -probenecid This list may not describe all possible interactions. Give your health care provider a list of all the medicines, herbs, non-prescription drugs, or dietary supplements you use. Also tell them if you smoke, drink alcohol, or use illegal drugs. Some items may interact with your medicine. What should I watch for while using this medicine? Tell your doctor or health care professional if your symptoms do not improve. Do not treat diarrhea with over the counter products. Contact your doctor if you have diarrhea that lasts more than 2 days or if it is severe and watery. If you have diabetes, you may get a false-positive result for sugar in your urine. Check with your doctor or health care professional. Birth control pills may not work properly while you are taking this medicine. Talk to your doctor about using an extra method of birth control. What side effects may I notice from receiving this medicine? Side effects that you should report to your doctor or health care professional as soon as possible: -allergic reactions like skin rash, itching or hives, swelling of the face, lips, or tongue -breathing problems -dark urine -fever or chills, sore throat -redness, blistering, peeling or loosening of the skin, including inside the mouth -seizures -trouble passing urine or change in the amount of urine -unusual bleeding, bruising -unusually weak  or tired -white patches or sores in the mouth or throat Side effects that usually do not require medical attention (report to your doctor or health care professional if they continue or are bothersome): -diarrhea -dizziness -headache -nausea, vomiting -stomach upset -vaginal or anal  irritation This list may not describe all possible side effects. Call your doctor for medical advice about side effects. You may report side effects to FDA at 1-800-FDA-1088. Where should I keep my medicine? Keep out of the reach of children. Store at room temperature below 25 degrees C (77 degrees F). Keep container tightly closed. Throw away any unused medicine after the expiration date. NOTE: This sheet is a summary. It may not cover all possible information. If you have questions about this medicine, talk to your doctor, pharmacist, or health care provider.  2015, Elsevier/Gold Standard. (2007-06-25 12:04:30)   Neutropenia Neutropenia is a condition that occurs when the level of a certain type of white blood cell (neutrophil) in your body becomes lower than normal. Neutrophils are made in the bone marrow and fight infections. These cells protect against bacteria and viruses. The fewer neutrophils you have, and the longer your body remains without them, the greater your risk of getting a severe infection becomes. CAUSES  The cause of neutropenia may be hard to determine. However, it is usually due to 3 main problems:   Decreased production of neutrophils. This may be due to:  Certain medicines such as chemotherapy.  Genetic problems.  Cancer.  Radiation treatments.  Vitamin deficiency.  Some pesticides.  Increased destruction of neutrophils. This may be due to:  Overwhelming infections.  Hemolytic anemia. This is when the body destroys its own blood cells.  Chemotherapy.  Neutrophils moving to areas of the body where they cannot fight infections. This may be due to:  Dialysis procedures.  Conditions where the spleen becomes enlarged. Neutrophils are held in the spleen and are not available to the rest of the body.  Overwhelming infections. The neutrophils are held in the area of the infection and are not available to the rest of the body. SYMPTOMS  There are no  specific symptoms of neutropenia. The lack of neutrophils can result in an infection, and an infection can cause various problems. DIAGNOSIS  Diagnosis is made by a blood test. A complete blood count is performed. The normal level of neutrophils in human blood differs with age and race. Infants have lower counts than older children and adults. African Americans have lower counts than Caucasians or Asians. The average adult level is 1500 cells/mm3 of blood. Neutrophil counts are interpreted as follows:  Greater than 1000 cells/mm3 gives normal protection against infection.  500 to 1000 cells/mm3 gives an increased risk for infection.  200 to 500 cells/mm3 is a greater risk for severe infection.  Lower than 200 cells/mm3 is a marked risk of infection. This may require hospitalization and treatment with antibiotic medicines. TREATMENT  Treatment depends on the underlying cause, severity, and presence of infections or symptoms. It also depends on your health. Your caregiver will discuss the treatment plan with you. Mild cases are often easily treated and have a good outcome. Preventative measures may also be started to limit your risk of infections. Treatment can include:  Taking antibiotics.  Stopping medicines that are known to cause neutropenia.  Correcting nutritional deficiencies by eating green vegetables to supply folic acid and taking vitamin B supplements.  Stopping exposure to pesticides if your neutropenia is related to pesticide exposure.  Taking a  blood growth factor called sargramostim, pegfilgrastim, or filgrastim if you are undergoing chemotherapy for cancer. This stimulates white blood cell production.  Removal of the spleen if you have Felty's syndrome and have repeated infections. HOME CARE INSTRUCTIONS   Follow your caregiver's instructions about when you need to have blood work done.  Wash your hands often. Make sure others who come in contact with you also wash their  hands.  Wash raw fruits and vegetables before eating them. They can carry bacteria and fungi.  Avoid people with colds or spreadable (contagious) diseases (chickenpox, herpes zoster, influenza).  Avoid large crowds.  Avoid construction areas. The dust can release fungus into the air.  Be cautious around children in daycare or school environments.  Take care of your respiratory system by coughing and deep breathing.  Bathe daily.  Protect your skin from cuts and burns.  Do not work in the garden or with flowers and plants.  Care for the mouth before and after meals by brushing with a soft toothbrush. If you have mucositis, do not use mouthwash. Mouthwash contains alcohol and can dry out the mouth even more.  Clean the area between the genitals and the anus (perineal area) after urination and bowel movements. Women need to wipe from front to back.  Use a water soluble lubricant during sexual intercourse and practice good hygiene after. Do not have intercourse if you are severely neutropenic. Check with your caregiver for guidelines.  Exercise daily as tolerated.  Avoid people who were vaccinated with a live vaccine in the past 30 days. You should not receive live vaccines (polio, typhoid).  Do not provide direct care for pets. Avoid animal droppings. Do not clean litter boxes and bird cages.  Do not share food utensils.  Do not use tampons, enemas, or rectal suppositories unless directed by your caregiver.  Use an electric razor to remove hair.  Wash your hands after handling magazines, letters, and newspapers. SEEK IMMEDIATE MEDICAL CARE IF:   You have a fever.  You have chills or start to shake.  You feel nauseous or vomit.  You develop mouth sores.  You develop aches and pains.  You have redness and swelling around open wounds.  Your skin is warm to the touch.  You have pus coming from your wounds.  You develop swollen lymph nodes.  You feel weak or  fatigued.  You develop red streaks on the skin. MAKE SURE YOU:  Understand these instructions.  Will watch your condition.  Will get help right away if you are not doing well or get worse. Document Released: 09/21/2001 Document Revised: 06/24/2011 Document Reviewed: 10/19/2010 [facility] Patient Information 2015 [place], [place]. This information is not intended to replace advice given to you by your health care provider. Make sure you discuss any questions you have with your health care provider.  Food Safety for the Immunocompromised Person  Food safety is important for people who are immunocompromised. Immunocompromised means that the immune system is impaired or weakened (as by drugs or illness). This diet is often recommended before and after certain cancer treatments and after an organ or bone marrow transplant. It is important to follow these food safety guidelines for as long as your dietitian or caregiver instructs you to. These food safety guidelines are sometimes called the neutropenic diet or low-microbial diet. Bacteria and other harmful microorganisms are more likely to be present in raw or fresh foods. Thoroughly cooking foods destroys these microorganisms. For example, fresh vegetables should be cooked  until tender; meats should be cooked until well-done; and eggs should be cooked until the yolks are firm. Also, certain food products are treated with a method known as pasteurization. Pasteurization briefly exposes food to high heat that kills any bacteria. Look for dairy products, juices, and ciders that have the word "pasteurized" on the label.  GENERAL GUIDELINES  Check expiration dates on all products before you buy them. Nothing you buy should be past the expiration date.  Wash the following items with soap and hot water before and after touching food:  Countertops.  Cutting boards (wash these in a dishwasher if you have one).  All cooking utensils.  All  silverware.  All pots and pans.  Before preparing food, wash your hands frequently with warm, soapy water and dry your hands with paper towels. This is especially important after touching raw meat, eggs, and fish.  Wash dishes in hot, soapy water or in a dishwasher. Air dry dishes. Do not use a cloth towel.  Keep perishable food very hot or very cold. Do not leave perishable items at room temperature for more than 10-15 minutes.  All perishable foods should be cooked thoroughly. No rare meat should be eaten.  Wash fruits and vegetables thoroughly under cold running water before peeling or cutting. Individually scrub produce that has a thick, rough skin or rind, such as lettuce, spinach, or cabbage. Do not use commercial rinses to wash fruits and vegetables.   Packaged salads, slaw mix, and other prepared produce (even marked "prewashed") should be rinsed again under cold running water.   If consuming unpasteurized or fresh tofu, cut tofu into 1 inch (or smaller) cubes. Then, boil the tofu for at least 5 minutes in water or broth before eating or using the tofu in recipes.  Use distilled or bottled water if you are using a water service other than the city water service.  Thaw frozen foods in the refrigerator overnight or quickly in the microwave. Do not thaw food on the countertop.  Refrigerate leftovers promptly in airtight containers.  Use leftovers only if they have been stored properly and have been around for no more than 24 hours.  When food shopping, avoid salad bars, bulk food bins, food samples, and snacks that are out in the open.  When dining out:  Avoid salad bars, delis, and buffets.  Use single-serve condiments, such as ketchup, mustard, mayonnaise, soy sauce, steak sauce, salt, pepper, and sugar. Check with your caregiver after blood work is done to see when your neutropenic diet can be modified with fewer restrictions. SPECIFIC EXAMPLE  GUIDELINES Beverages  Allowed: Boiled well water. Bottled spring, distilled, and natural water. Tap water and ice made from bottled or tap water. All canned, bottled, powdered beverages. Instant and brewed coffee, tea; cold-brewed tea made with boiling water. Brewed herbal teas using commercially-packaged tea bags. Liquid and powdered commercial nutritional supplements. Other beverages not listed below.   Avoid: Unboiled well water.Cold-brewed tea made with warm or cold water. Raw, unpasteurized milk. Unpasteurized fruit and vegetable juices. Mat tea. Eggnog or milkshakes made with raw eggs. Fresh apple cider.  Meat, Fish, Eggs, Poultry  Allowed: All thoroughly-cooked or canned meats: beef, pork, lamb, poultry, fish, shellfish, game, ham, bacon, sausage, hot dogs. Thoroughly cooked pasteurized egg substitutes and eggs (egg white cooked firm with thickened yellow yolk acceptable). Commercially packaged salami, bologna, and other luncheon meats. Hot dogs should be heated until steaming (165F [73.9C]). Cooked tofu. Prepackaged peanut butter.  Avoid: Uncooked or  rare meat, fish, eggs, or poultry. Commercially prepared meat and fish salads. Sushi. Raw or undercooked meat, poultry, fish, game, tofu. Raw or undercooked eggs and egg substitutes. Unheated meats and cold cuts from the deli. Hard cured salami in natural wrap. Cold-smoked salmon, lox. Pickled fish. [place] products. Dairy Products  Allowed: Pasteurized, grade "A" milk or milk products or lactose-free milk or yogurt. Pasteurized yogurt or frozen yogurt. Prepackaged ice cream, sherbet, ice cream bars, homemade milkshakes. Prepackaged and pasteurized hard cheeses, such as cheddar, Viola, [place], or Swiss. Prepackaged soft cheeses, such as cottage cheese, cream cheese, or ricotta. Dry, refrigerated, and frozen pasteurized whipped topping. Commercial nutritional supplements. Pasteurized eggnog. Pasteurized sour cream.  Avoid: Soft-serve  ice cream or frozen yogurt. Hand-packed ice cream or frozen yogurt. Feta, brie, camembert, blue, gorgonzola, Stilton, Roquefort, farmer's cheese, and queso fresco cheeses. Any imported cheeses and any cheese sliced at a deli.Unpasteurized or raw milk cheese, yogurt, and other milk products. Cheeses containing chili peppers or other uncooked vegetables. Breads, Cereals, Rice, Potatoes, Pasta  Allowed: All prepackaged or homemade breads, bagels, rolls, pancakes, sweet rolls, waffles, [place] toast, muffins, cakes, donuts, cookies, crackers. All boxed hot or cold cereals. Cooked potatoes, rice, noodles, other grains. Potato chips, corn chips, tortilla chips, pretzels, popcorn.  Avoid: Fresh bakery breads, muffins, cakes, donuts, cream or custard filled cakes. Raw grain products. Vegetables and Fruits  Allowed: All frozen, canned, and washed raw vegetables that have been cooked. All cooked or canned fruits. Raw, well-washed and non-bruised fruits. Pasteurized fruit juices. Canned and stewed fruit. Dried fruits.  Avoid: Unwashedraw vegetables and salads. All raw vegetable sprouts (alfalfa, radish, broccoli, mung bean, all others). Salads from the deli.Prepackaged salsas stored in refrigerated case. Unwashed raw fruits. Unpasteurized fruit and vegetable juices. Nuts  Allowed: Processed peanut butter. Canned or bottled roasted nuts. Nuts in baked products.  Avoid:Unroasted raw nuts. Unprocessed nuts. Roasted nuts in the shell. Condiments and Spices  Allowed: All cooked, fresh, or canned spices (add at least 5 minutes before cooking ends). Thoroughly washed fresh herbs and spices. Ketchup, mustard, BBQ sauce, soy sauce, and mayonnaise served in separate containers with clean utensils, refrigerated after opening. Sugar, jelly, and honey served from clean containers with clean utensils.   Avoid: Uncooked spices. Raw honey. Anything from a family container that is not freshly washed.   Desserts  Allowed:Refrigerated commercial and homemade cakes, pies, pastries, and pudding. Refrigerated cream-filled pastries. Homemade and commercial cookies. Shelf-stable cream-filled cupcakes, fruit pies, and canned pudding. Ices, popsicle-like products.  Avoid: Unrefrigerated, cream-filled pastry products (not shelf-stable). Fats  Allowed: Oil, shortening, refrigerated lard, margarine, butter. Commercial or shelf-stable mayonnaise and salad dressings (including cheese-based salad dressings, refrigerated after opening). Cooked gravy and sauces.  Avoid: Fresh salad dressings containing aged cheese (blue cheese, Roquefort) or raw eggs, stored in refrigerated case. Restaurant Foods and Miscellaneous  Allowed:Thoroughly cooked frozen dinners. Thoroughly cooked frozen pizza. Canned entrees.   Avoid: Eating at restaurants while in neutropenia or using take-out deli food even if it is behind the counter. Avoid all salad bars while you are neutropenic. Avoid all self-serve buffets while you are neutropenic.  Ask your caregiver for information or recommendations regarding poor appetite and weight loss during cancer treatment if needed.  Document Released: 01/27/2007 Document Revised: 10/01/2011 Document Reviewed: 08/16/2011 [facility] Patient Information 2015 [place], [place]. This information is not intended to replace advice given to you by your health care provider. Make sure you discuss any questions you have with your health care provider.

## 2014-03-22 ENCOUNTER — Telehealth: Payer: Self-pay | Admitting: Gynecologic Oncology

## 2014-03-22 ENCOUNTER — Encounter: Payer: Self-pay | Admitting: Gynecologic Oncology

## 2014-03-22 NOTE — Telephone Encounter (Signed)
Attempting to call patient to discuss current status and to inform her of her lab work with need to call in potassium supplementation.  Will retry at a later time.

## 2014-03-22 NOTE — Progress Notes (Signed)
Follow Up Note: Gyn-Onc  Karen Daniels 38 y.o. female  CC:  Chief Complaint  Patient presents with  . Sore throat, cough    Follow up    HPI:  Karen Daniels is a 38 y.o. 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     Cervical cancer ([facility])   08/29/2013 Initial Diagnosis Presented to ED with vaginal discharge. Found to have large friable mass, biopsy showed adenocarcinoma of cervix, no LVSI.   09/07/2013 -  Cancer Staged Saw Dr. Alycia Rossetti in [place]. Exam showed 8cm mass fillign vagina, no parametrial or sidewall involvement - clinical IB2. PET showed positive pelvic LN.   10/11/2013 - 11/23/2013 Radiation EBRT, total of 45 cGy to whole pelvis and additional 9 cGy to PET positive nodes. Suboptimal response, not candidate for HDR.   10/11/2013 - 11/08/2013 Chemotherapy Sensitizing cisplatin 40 mg/m2 x5 cycles. Cycle 6 held due to heavy vaginal bleeding on 11/09/13, requiring admission to hospital and vaginal packing and transfusion of 3U pRBCs. Exam 11/15/13 showed persistent 5cm bulky, friable mass.   12/24/2013 Surgery Abdominal modified radical hysterectomy, bilateral salping-oophorectomy, bilateral pelvic lymphadenectomy.    Given her poor response to chemoradiation, with a persistent 5cm friable cervical mass, the decision was made to proceed with completion hysterectomy.   Procedure:  12/24/13 - Abdominal modified radical hysterectomy, bilateral salpingo-oophorectomy, bilateral pelvic lymphadenectomy.  Intraoperative Findings:  There was an exophytic 5-6 cm cervical tumor with no evidence of parametrial invasion or obvious metastatic disease. The tubes and ovaries appeared normal bilaterally. The appendix appeared normal.  Pathology:  Accession #: [id number]  Diagnosis: A: Uterus, cervix, bilateral  tubes and ovaries, parametria, radical hysterectomy and upper vaginectomy  Histologic type: Mucinous adenocarcinoma  Histologic grade: well differentiated (see comment) Tumor focality/site: (quadrant(s) of cervix) circumferential  Tumor size: (greatest dimension) 5.0 cm  Extent of invasion: Depth: 1.2 cm Wall thickness: 1.8 cm Percent: 71% Lymphovascular space invasion: present  Parametrial soft tissue involvement: negative  Vaginal cuff involvement: positive for invasive adenocarcinoma from approximately 8 to 10 o'clock  Surgical margins:  Invasive: vaginal cuff margins positive from 8 to 10 o'clock  Intraepithelial: not applicable, none seen  Regional lymph nodes (see specimens B and C): Total number involved: 0.  Total number examined: 8 Additional pathologic findings:  Endometrium: atrophy Myometrium: small benign leiomyomata  Right ovary: atrophy  Right fallopian tube: no significant pathologic abnormality, no neoplasm identified Left ovary: atrophy  Left fallopian tube: no significant pathologic abnormality, no neoplasm identified   B: Lymph nodes, right pelvic, regional resection  - No evidence of metastatic disease in five lymph nodes (0/5)   C: Lymph nodes, left pelvic, regional resection  - No evidence of metastatic disease in three lymph nodes (0/3)   Comment: The tumor cells exhibit grade 2 nuclear changes, but some of the atypia may be secondary to radiation effect. Many of the tumor cells are flattened. The infiltrating glands contain abundant mucin.   She last saw Dr. Alycia Rossetti on 03/14/14 at [facility] for her third S3 planned cycles of paclitaxel and carboplatin.  At that time, her CBC did meet treatment criteria but was low. Plan was for her to  be followed expectantly and if her white count goes low, she would be given neutropenic precautions.   Interval History:  She presents  today for evaluation of upper respiratory symptoms.  She reports symptoms of mild sore throat and  mild cough beginning Friday, November 27 but states symptoms improved after taking decadron the night before her appointment with Dr. Alycia Rossetti on 03/14/14.  Symptoms have worsened and persisted since that time.  She reports having a productive cough in the mornings with minimal amount of yellow sputum along with a dry cough at night.  Denies fever, chills, wheezing, or dyspnea.  Sinus drainage at times.  Ears achy intermittently and reporting chest pressure.  She denies being around anyone with a recent illness, etc.  She went to a cheerleading competition yesterday and feels she probably overdid it.  No concerns voiced after receiving chemotherapy except for presenting symptoms.  She had taken mucinex with no relief along with Tylenol cold and flu for two days with no relief.  No aggravating or alleviating factors reported. No changes in hearing or vision and no difficulty swallowing.        Review of Systems  Constitutional: Feels worn down.  Decreased appetite.  No fever or chills.  Cardiovascular: No chest pain, shortness of breath, or edema.  Pulmonary: Productive cough in the am, dry at night.  No wheeze.  Gastrointestinal: No nausea, vomiting, or diarrhea. No bright red blood per rectum or change in bowel movement.  Genitourinary: No frequency, urgency, or dysuria. No vaginal bleeding or discharge.  Musculoskeletal: No myalgia or joint pain. Neurologic: No weakness, numbness, or change in gait.  Psychology: No depression, anxiety, or insomnia.  Current Meds:  Outpatient Encounter Prescriptions as of 03/21/2014  Medication Sig  . acyclovir (ZOVIRAX) 400 MG tablet Take 1 tablet (400 mg total) by mouth 2 (two) times daily. Begin after 10 day full course dose for suppression of lesions for 1-2 months beyond completition of chemotherapy/RT.  Marland Kitchen dexamethasone (DECADRON) 4 MG tablet Take 20 mg by mouth. Per pt, "Take 20mg  the night before and morning of chemo."  . amoxicillin-clavulanate (AUGMENTIN)  875-125 MG per tablet Take 1 tablet by mouth 2 (two) times daily.  Marland Kitchen docusate sodium (COLACE) 100 MG capsule Take 100 mg by mouth 2 (two) times daily.  . ferrous fumarate (HEMOCYTE) 325 (106 FE) MG TABS tablet Take 1 tablet daily on an empty stomachwith OJ (Patient not taking: Reported on 03/21/2014)  . HYDROcodone-acetaminophen (NORCO/VICODIN) 5-325 MG per tablet Take 2 tablets by mouth every 4 (four) hours as needed (pain.).  Marland Kitchen LORazepam (ATIVAN) 0.5 MG tablet Take 1 -2 tablets by mouth or under the tongue every 6 hours as needed for nausea. (Patient not taking: Reported on 03/21/2014)  . ondansetron (ZOFRAN) 8 MG tablet Take one tablet every 8 hours as needed for nausea. Start the 3rd day after chemotherapy. (Patient not taking: Reported on 03/21/2014)  . [DISCONTINUED] enoxaparin (LOVENOX) 40 MG/0.4ML injection Inject 40 mg into the skin daily. For 26 days, starting 12/27/2013 and ending 01/22/2014.    Allergy: No Known Allergies  Social Hx:   History   Social History  . Marital Status: Divorced    Spouse Name: N/A    Number of Children: 2  . Years of Education: N/A   Occupational History  .     Social History Main Topics  . Smoking status: Former Smoker -- 0.50 packs/day for 2 years    Types: Cigarettes    Quit date: 08/30/2013  . Smokeless tobacco: Never Used  . Alcohol Use: Yes     Comment: occasionally  . Drug Use: No  .  Sexual Activity: Yes   Other Topics Concern  . Not on file   Social History Narrative    Past Surgical Hx:  Past Surgical History  Procedure Laterality Date  . Wisdom tooth extraction    . Colposcopy    . Diagnostic laparoscopy  07-28-2000    w/ lysis adhesions  . Colonoscopy  07-11-1999  . Abdominal hysterectomy      Past Medical Hx:  Past Medical History  Diagnosis Date  . 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 Anxiety   . Depression   . Adenocarcinoma of cervix, stage 1     dx  may 2015   STAGE I B2 (+pet scan pelvic nodes  for mets)---  oncologist--  dr Marko Plume and dr Sondra Come--  chemo and external radiation    . GERD (gastroesophageal reflux disease)   . Radiation 10/11/13-11/23/13    whole pelvis 45 gray    Family Hx:  Family History  Problem Relation Age of Onset  . Heart disease Father   . COPD Father   . Diabetes Father   . Hypertension Father   . Arthritis Father   . Arthritis Mother   . Breast cancer Maternal Aunt   . Diabetes Maternal Grandmother   . Cancer Maternal Grandmother     skin cancer  . Heart disease Maternal Grandmother   . Breast cancer Paternal Grandmother   . Cancer Maternal Aunt     lung    Vitals:  Blood pressure 129/88, pulse 107, temperature 98.2 F (36.8 C), temperature source Oral, resp. rate 16, height 5\' 5"  (1.651 m), weight 154 lb 8 oz (70.081 kg), SpO2 100 %.   CBC    Component Value Date/Time   WBC 0.8* 03/21/2014 1502   WBC 6.0 01/01/2014 1541   RBC 2.67* 03/21/2014 1502   RBC 3.37* 01/01/2014 1541   HGB 8.2* 03/21/2014 1502   HGB 10.6* 01/01/2014 1541   HCT 24.1* 03/21/2014 1502   HCT 30.2* 01/01/2014 1541   PLT 154 03/21/2014 1502   PLT 321 01/01/2014 1541   MCV 90.5 03/21/2014 1502   MCV 89.6 01/01/2014 1541   MCH 30.6 03/21/2014 1502   MCH 31.5 01/01/2014 1541   MCHC 33.8 03/21/2014 1502   MCHC 35.1 01/01/2014 1541   RDW 15.8* 03/21/2014 1502   RDW 15.6* 01/01/2014 1541   LYMPHSABS 0.4* 03/21/2014 1502   LYMPHSABS 0.7 01/01/2014 1541   MONOABS 0.1 03/21/2014 1502   MONOABS 0.6 01/01/2014 1541   EOSABS 0.0 03/21/2014 1502   EOSABS 0.1 01/01/2014 1541   BASOSABS 0.0 03/21/2014 1502   BASOSABS 0.0 01/01/2014 1541   CMP Latest Ref Rng 03/21/2014 03/03/2014 02/10/2014  Glucose 70 - 140 mg/dl 99 93 98  BUN 7.0 - 26.0 mg/dL 12.5 9.6 11.1  Creatinine 0.6 - 1.1 mg/dL 0.8 0.8 0.8  Sodium 136 - 145 mEq/L 138 140 141  Potassium 3.5 - 5.1 mEq/L 3.1(L) 4.1 4.2  Chloride 96 - 112 mEq/L - - -  CO2 22 - 29 mEq/L 29 28 28   Calcium 8.4 - 10.4 mg/dL 8.8 9.4  9.0  Total Protein 6.4 - 8.3 g/dL 6.6 6.8 6.0(L)  Total Bilirubin 0.20 - 1.20 mg/dL 0.27 0.36 <0.20  Alkaline Phos 40 - 150 U/L 86 84 101  AST 5 - 34 U/L 27 26 28   ALT 0 - 55 U/L 25 30 27      Physical Exam:  General: Well developed, well nourished female in no acute distress. Alert and  oriented x 3.  Head/Neck: Bilateral tympanic membranes pearly gray without erythema, minimal amount of clear fluid present.  Sinus turbinates mildly edematous with erythema.  No sinus tenderness on palpation.  Oropharynx clear, no erythema.  Sclerae anicteric.  Supple without any enlargements.  Lymph node survey: No cervical, supraclavicular, or inguinal adenopathy.  Cardiovascular: Regular rate and rhythm. S1 and S2 normal.  Lungs: Clear to auscultation bilaterally. No wheezes/crackles/rhonchi noted.  Skin: No rashes or lesions present. Back: No CVA tenderness.  Extremities: No bilateral cyanosis, edema, or clubbing.   Assessment/Plan:   Karen Daniels is a 38 y.o. 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 Dispositioned to vaginal brachytherapy and consideration for further treatment with Carbo/Taxol after completing radiation treatment.  She completed cycle #3 of 3 paclitaxel and carboplatin on 03/14/14 and completed her radiation 2 weeks from that day.   Above findings discussed with Dr. Alycia Rossetti.  Plan to begin Augmentin twice daily for 7 days, initiate neutropenic precautions, and repeat lab work on Thursday, Dec. 10 per Dr. Alycia Rossetti.  neutropenic precautions discussed with patient and masks given. She is to call the office if symptoms worsen or persist between now and Thursday or if a fever develops.  Reportable signs and symptoms reviewed.  She will follow up on Thursday with repeat lab work.  She is advised to call for any questions or concerns.       Byrl Latin DEAL, NP 03/22/2014, 11:52 AM

## 2014-03-24 ENCOUNTER — Other Ambulatory Visit: Payer: Self-pay

## 2014-03-24 ENCOUNTER — Other Ambulatory Visit (HOSPITAL_BASED_OUTPATIENT_CLINIC_OR_DEPARTMENT_OTHER): Payer: Medicaid Other

## 2014-03-24 ENCOUNTER — Emergency Department (HOSPITAL_COMMUNITY): Payer: Medicaid Other

## 2014-03-24 ENCOUNTER — Inpatient Hospital Stay (HOSPITAL_COMMUNITY)
Admission: EM | Admit: 2014-03-24 | Discharge: 2014-03-25 | DRG: 810 | Disposition: A | Discharge status: Home / Self Care | Payer: Medicaid Other | Attending: Internal Medicine | Admitting: Internal Medicine

## 2014-03-24 ENCOUNTER — Other Ambulatory Visit: Payer: Medicaid Other

## 2014-03-24 ENCOUNTER — Telehealth: Payer: Self-pay | Admitting: *Deleted

## 2014-03-24 DIAGNOSIS — Z8541 Personal history of malignant neoplasm of cervix uteri: Secondary | ICD-10-CM | POA: Diagnosis not present

## 2014-03-24 DIAGNOSIS — F329 Major depressive disorder, single episode, unspecified: Secondary | ICD-10-CM

## 2014-03-24 DIAGNOSIS — Z87891 Personal history of nicotine dependence: Secondary | ICD-10-CM | POA: Diagnosis not present

## 2014-03-24 DIAGNOSIS — C539 Malignant neoplasm of cervix uteri, unspecified: Secondary | ICD-10-CM | POA: Diagnosis present

## 2014-03-24 DIAGNOSIS — T66XXXA Radiation sickness, unspecified, initial encounter: Secondary | ICD-10-CM | POA: Diagnosis present

## 2014-03-24 DIAGNOSIS — T451X5A Adverse effect of antineoplastic and immunosuppressive drugs, initial encounter: Secondary | ICD-10-CM | POA: Diagnosis present

## 2014-03-24 DIAGNOSIS — E876 Hypokalemia: Secondary | ICD-10-CM

## 2014-03-24 DIAGNOSIS — D61818 Other pancytopenia: Secondary | ICD-10-CM | POA: Diagnosis not present

## 2014-03-24 DIAGNOSIS — D649 Anemia, unspecified: Secondary | ICD-10-CM | POA: Diagnosis present

## 2014-03-24 DIAGNOSIS — F419 Anxiety disorder, unspecified: Secondary | ICD-10-CM | POA: Diagnosis present

## 2014-03-24 DIAGNOSIS — R0602 Shortness of breath: Secondary | ICD-10-CM

## 2014-03-24 DIAGNOSIS — R05 Cough: Secondary | ICD-10-CM

## 2014-03-24 DIAGNOSIS — Z90711 Acquired absence of uterus with remaining cervical stump: Secondary | ICD-10-CM | POA: Diagnosis present

## 2014-03-24 DIAGNOSIS — E785 Hyperlipidemia, unspecified: Secondary | ICD-10-CM | POA: Diagnosis present

## 2014-03-24 DIAGNOSIS — K219 Gastro-esophageal reflux disease without esophagitis: Secondary | ICD-10-CM | POA: Diagnosis present

## 2014-03-24 LAB — TROPONIN I

## 2014-03-24 LAB — CBC WITH DIFFERENTIAL/PLATELET
BASO%: 0 % (ref 0.0–2.0)
Basophils Absolute: 0 10*3/uL (ref 0.0–0.1)
EOS%: 1.1 % (ref 0.0–7.0)
Eosinophils Absolute: 0 10*3/uL (ref 0.0–0.5)
HCT: 22.3 % — ABNORMAL LOW (ref 34.8–46.6)
HGB: 7.6 g/dL — ABNORMAL LOW (ref 11.6–15.9)
LYMPH%: 52.2 % — ABNORMAL HIGH (ref 14.0–49.7)
MCH: 31 pg (ref 25.1–34.0)
MCHC: 34.1 g/dL (ref 31.5–36.0)
MCV: 91 fL (ref 79.5–101.0)
MONO#: 0.4 10*3/uL (ref 0.1–0.9)
MONO%: 39.1 % — AB (ref 0.0–14.0)
NEUT#: 0.1 10*3/uL — CL (ref 1.5–6.5)
NEUT%: 7.6 % — ABNORMAL LOW (ref 38.4–76.8)
PLATELETS: 118 10*3/uL — AB (ref 145–400)
RBC: 2.45 10*6/uL — AB (ref 3.70–5.45)
RDW: 14.7 % — AB (ref 11.2–14.5)
WBC: 0.9 10*3/uL — CL (ref 3.9–10.3)
lymph#: 0.5 10*3/uL — ABNORMAL LOW (ref 0.9–3.3)

## 2014-03-24 LAB — CBC
HEMATOCRIT: 21.7 % — AB (ref 36.0–46.0)
HEMOGLOBIN: 7.2 g/dL — AB (ref 12.0–15.0)
MCH: 30.5 pg (ref 26.0–34.0)
MCHC: 33.2 g/dL (ref 30.0–36.0)
MCV: 91.9 fL (ref 78.0–100.0)
Platelets: 132 10*3/uL — ABNORMAL LOW (ref 150–400)
RBC: 2.36 MIL/uL — ABNORMAL LOW (ref 3.87–5.11)
RDW: 14.8 % (ref 11.5–15.5)
WBC: 1.1 10*3/uL — CL (ref 4.0–10.5)

## 2014-03-24 LAB — COMPREHENSIVE METABOLIC PANEL (CC13)
ALT: 21 U/L (ref 0–55)
AST: 24 U/L (ref 5–34)
Albumin: 3.3 g/dL — ABNORMAL LOW (ref 3.5–5.0)
Alkaline Phosphatase: 86 U/L (ref 40–150)
Anion Gap: 9 mEq/L (ref 3–11)
BILIRUBIN TOTAL: 0.2 mg/dL (ref 0.20–1.20)
BUN: 9 mg/dL (ref 7.0–26.0)
CO2: 31 mEq/L — ABNORMAL HIGH (ref 22–29)
Calcium: 9 mg/dL (ref 8.4–10.4)
Chloride: 101 mEq/L (ref 98–109)
Creatinine: 0.8 mg/dL (ref 0.6–1.1)
Glucose: 95 mg/dl (ref 70–140)
Potassium: 3.4 mEq/L — ABNORMAL LOW (ref 3.5–5.1)
SODIUM: 140 meq/L (ref 136–145)
TOTAL PROTEIN: 6.5 g/dL (ref 6.4–8.3)

## 2014-03-24 LAB — BASIC METABOLIC PANEL
ANION GAP: 12 (ref 5–15)
BUN: 10 mg/dL (ref 6–23)
CHLORIDE: 99 meq/L (ref 96–112)
CO2: 29 meq/L (ref 19–32)
Calcium: 9.3 mg/dL (ref 8.4–10.5)
Creatinine, Ser: 0.78 mg/dL (ref 0.50–1.10)
GFR calc non Af Amer: >90 mL/min (ref >90)
Glucose, Bld: 94 mg/dL (ref 70–99)
POTASSIUM: 3.6 meq/L — AB (ref 3.7–5.3)
SODIUM: 140 meq/L (ref 137–147)

## 2014-03-24 LAB — PREPARE RBC (CROSSMATCH)

## 2014-03-24 MED ORDER — POTASSIUM CHLORIDE CRYS ER 20 MEQ PO TBCR
20.0000 meq | EXTENDED_RELEASE_TABLET | Freq: Once | ORAL | Status: AC
  Administered 2014-03-25: 20 meq via ORAL
  Filled 2014-03-24: qty 1

## 2014-03-24 MED ORDER — SODIUM CHLORIDE 0.9 % IV SOLN
10.0000 mL/h | Freq: Once | INTRAVENOUS | Status: AC
  Administered 2014-03-25: 10 mL/h via INTRAVENOUS

## 2014-03-24 MED ORDER — SODIUM CHLORIDE 0.9 % IJ SOLN
3.0000 mL | Freq: Two times a day (BID) | INTRAMUSCULAR | Status: DC
  Administered 2014-03-25 (×2): 3 mL via INTRAVENOUS

## 2014-03-24 MED ORDER — DOCUSATE SODIUM 100 MG PO CAPS
100.0000 mg | ORAL_CAPSULE | Freq: Two times a day (BID) | ORAL | Status: DC | PRN
  Administered 2014-03-25: 100 mg via ORAL
  Filled 2014-03-24 (×3): qty 1

## 2014-03-24 MED ORDER — IOHEXOL 350 MG/ML SOLN
100.0000 mL | Freq: Once | INTRAVENOUS | Status: AC | PRN
  Administered 2014-03-24: 100 mL via INTRAVENOUS

## 2014-03-24 MED ORDER — ACYCLOVIR 400 MG PO TABS
400.0000 mg | ORAL_TABLET | Freq: Two times a day (BID) | ORAL | Status: DC
Start: 2014-03-25 — End: 2014-03-25
  Administered 2014-03-25 (×2): 400 mg via ORAL
  Filled 2014-03-24 (×3): qty 1

## 2014-03-24 MED ORDER — ONDANSETRON HCL 4 MG PO TABS: 4.0000 mg | ORAL_TABLET | Freq: Three times a day (TID) | ORAL | Status: DC | PRN

## 2014-03-24 MED ORDER — HYDROCODONE-ACETAMINOPHEN 5-325 MG PO TABS: 2.0000 | ORAL_TABLET | ORAL | Status: DC | PRN

## 2014-03-24 MED ORDER — LORAZEPAM 0.5 MG PO TABS: 0.5000 mg | ORAL_TABLET | Freq: Four times a day (QID) | ORAL | Status: DC | PRN

## 2014-03-24 MED ORDER — AMOXICILLIN-POT CLAVULANATE 875-125 MG PO TABS
1.0000 | ORAL_TABLET | Freq: Two times a day (BID) | ORAL | Status: DC
  Administered 2014-03-25 (×2): 1 via ORAL
  Filled 2014-03-24 (×3): qty 1

## 2014-03-24 NOTE — ED Notes (Signed)
Pt with Hx of cancer received phone call saying that Hbg was 7.6 and WBC is 0.1, patient c/o SOB. Pt's last radiation Tx on 11-6, last chemo treatment on 11-30.

## 2014-03-24 NOTE — H&P (Signed)
[facility] History and Physical  Karen Daniels EXH:[id number] DOB: 07-26-1975 DOA: 03/24/2014  Referring physician: ED physician PCP: No PCP Per Patient  Specialists:   Chief Complaint: SOB HPI: Karen Daniels is a 38 y.o. female with past medical history of GERD, hyperlipidemia, recently diagnosed cervical cancer, who presents with shortness of breath on exertion.  Patient reports that she started having shortness of breath on exertion at about 5:30 PM. She does not have chest pain, dizziness. No vaginal bleeding, hematuria or hematochezia. Since patient has been receiving radiation and chemotherapy for cervical cancer, she was told by her oncologist to come to the hospital if she develops shortness of breath.   Of note, she has mild cough and sore throat in the past 2 weeks. She was given prescription of Augmentin by oncologist, which she is supposed to take it for 7 days. She has been taking Augmentin for 3 days, needs to take it for 4 more days. Her sore throat has resolved completely, but she still has mild cough with very minimal amount of sputum production.   She has chills, but no denies fever, chest pain, SOB, abdominal pain, diarrhea, dysuria, urgency, frequency, hematuria, skin rashes, joint pain or leg swelling.  Review of Systems: As presented in the history of presenting illness, rest negative.  Where does patient live?  At home Can patient participate in ADLs? Yes  Allergy: No Known Allergies  Past Medical History  Diagnosis Date  . 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 Anxiety   . Depression   . Adenocarcinoma of cervix, stage 1     dx  may 2015   STAGE I B2 (+pet scan pelvic nodes for mets)---  oncologist--  dr Marko Plume and dr Sondra Come--  chemo and external radiation    . GERD (gastroesophageal reflux disease)   . Radiation 10/11/13-11/23/13    whole pelvis 45 gray    Past Surgical History  Procedure Laterality Date  . Wisdom tooth extraction     . Colposcopy    . Diagnostic laparoscopy  07-28-2000    w/ lysis adhesions  . Colonoscopy  07-11-1999  . Abdominal hysterectomy      Social History:  reports that she quit smoking about 6 months ago. Her smoking use included Cigarettes. She has a 1 pack-year smoking history. She has never used smokeless tobacco. She reports that she drinks alcohol. She reports that she does not use illicit drugs.  Family History:  Family History  Problem Relation Age of Onset  . Heart disease Father   . COPD Father   . Diabetes Father   . Hypertension Father   . Arthritis Father   . Arthritis Mother   . Breast cancer Maternal Aunt   . Diabetes Maternal Grandmother   . Cancer Maternal Grandmother     skin cancer  . Heart disease Maternal Grandmother   . Breast cancer Paternal Grandmother   . Cancer Maternal Aunt     lung     Prior to Admission medications   Medication Sig Start Date End Date Taking? Authorizing Provider  acyclovir (ZOVIRAX) 400 MG tablet Take 1 tablet (400 mg total) by mouth 2 (two) times daily. Begin after 10 day full course dose for suppression of lesions for 1-2 months beyond completition of chemotherapy/RT. 11/05/13  Yes Lennis Marion Downer, MD  amoxicillin-clavulanate (AUGMENTIN) 875-125 MG per tablet Take 1 tablet by mouth 2 (two) times daily. 03/21/14  Yes Dorothyann Gibbs, NP  dexamethasone (DECADRON)  4 MG tablet Take 20 mg by mouth. Per pt, "Take 20mg  the night before and morning of chemo." 01/24/14 07/23/14  Historical Provider, MD  docusate sodium (COLACE) 100 MG capsule Take 100 mg by mouth 2 (two) times daily.    Historical Provider, MD  ferrous fumarate (HEMOCYTE) 325 (106 FE) MG TABS tablet Take 1 tablet daily on an empty stomachwith OJ Patient not taking: Reported on 03/21/2014 12/06/13   Lennis Marion Downer, MD  HYDROcodone-acetaminophen (NORCO/VICODIN) 5-325 MG per tablet Take 2 tablets by mouth every 4 (four) hours as needed (pain.).    Historical Provider, MD  LORazepam  (ATIVAN) 0.5 MG tablet Take 1 -2 tablets by mouth or under the tongue every 6 hours as needed for nausea. Patient not taking: Reported on 03/21/2014 11/15/13   Everitt Amber, MD  ondansetron [facility]) 8 MG tablet Take one tablet every 8 hours as needed for nausea. Start the 3rd day after chemotherapy. Patient not taking: Reported on 03/21/2014 10/11/13   Gordy Levan, MD    Physical Exam: Filed Vitals:   03/24/14 1904 03/24/14 2030  BP: 120/79 113/67  Pulse: 103 87  Temp: 98 F (36.7 C)   TempSrc: Oral   Resp: 16 16  SpO2: 99% 100%   General: Not in acute distress. Pale looking HEENT:       Eyes: PERRL, EOMI, no scleral icterus       ENT: No discharge from the ears and nose, no pharynx injection, no tonsillar enlargement.        Neck: No JVD, no bruit, no mass felt. Cardiac: S1/S2, RRR, No murmurs, No gallops or rubs Pulm: Good air movement bilaterally. Clear to auscultation bilaterally. No rales, wheezing, rhonchi or rubs. Abd: Soft, nondistended, nontender, no rebound pain, no organomegaly, BS present Ext: No edema bilaterally. 2+DP/PT pulse bilaterally Musculoskeletal: No joint deformities, erythema, or stiffness, ROM full Skin: No rashes.  Neuro: Alert and oriented X3, cranial nerves II-XII grossly intact, muscle strength 5/5 in all extremeties, sensation to light touch intact.  Psych: Patient is not psychotic, no suicidal or hemocidal ideation.  Labs on Admission:  Basic Metabolic Panel:  Recent Labs Lab 03/21/14 1503 03/24/14 1601 03/24/14 2014  NA 138 140 140  K 3.1* 3.4* 3.6*  CL  --   --  99  CO2 29 31* 29  GLUCOSE 99 95 94  BUN 12.5 9.0 10  CREATININE 0.8 0.8 0.78  CALCIUM 8.8 9.0 9.3   Liver Function Tests:  Recent Labs Lab 03/21/14 1503 03/24/14 1601  AST 27 24  ALT 25 21  ALKPHOS 86 86  BILITOT 0.27 0.20  PROT 6.6 6.5  ALBUMIN 3.4* 3.3*   No results for input(s): LIPASE, AMYLASE in the last 168 hours. No results for input(s): AMMONIA in the last  168 hours. CBC:  Recent Labs Lab 03/21/14 1502 03/24/14 1601 03/24/14 2014  WBC 0.8* 0.9* 1.1*  NEUTROABS 0.3* 0.1*  --   HGB 8.2* 7.6* 7.2*  HCT 24.1* 22.3* 21.7*  MCV 90.5 91.0 91.9  PLT 154 118* 132*   Cardiac Enzymes:  Recent Labs Lab 03/24/14 2014  TROPONINI <0.30    BNP (last 3 results) No results for input(s): PROBNP in the last 8760 hours. CBG: No results for input(s): GLUCAP in the last 168 hours.  Radiological Exams on Admission: Dg Chest 2 View  03/24/2014   CLINICAL DATA:  Increasing shortness of breath. History of cervical cancer. Radiotherapy for cervical cancer.  EXAM: CHEST  2 VIEW  COMPARISON:  11/19/2013.  FINDINGS: Cardiopericardial silhouette within normal limits. Mediastinal contours normal. Trachea midline. No airspace disease or effusion. Monitoring leads project over the chest.  IMPRESSION: No active cardiopulmonary disease.   Electronically Signed   By: Dereck Ligas M.D.   On: 03/24/2014 20:02   Ct Angio Chest Pe W/cm &/or Wo Cm  03/24/2014   CLINICAL DATA:  Shortness of breath with exertion for 2 days. Chest discomfort. History of cervical cancer. Current chemotherapy. Radiation therapy is complete.  EXAM: CT ANGIOGRAPHY CHEST WITH CONTRAST  TECHNIQUE: Multidetector CT imaging of the chest was performed using the standard protocol during bolus administration of intravenous contrast. Multiplanar CT image reconstructions and MIPs were obtained to evaluate the vascular anatomy.  CONTRAST:  167mL OMNIPAQUE IOHEXOL 350 MG/ML SOLN  COMPARISON:  Chest x-ray 03/24/2014  FINDINGS: Heart: Heart is normal in size. No pericardial effusion. No significant coronary artery calcification.  Vascular structures: Pulmonary arteries are well opacified. No embolus.  Mediastinum/thyroid: No mediastinal, hilar, or axillary adenopathy. The visualized portion of the thyroid gland has a normal appearance.  Lungs/Airways: Airways are patent. No pulmonary nodules, pleural  effusions, or infiltrates.  Upper abdomen: Unremarkable.  Chest wall/osseous structures: Unremarkable.  Review of the MIP images confirms the above findings.  IMPRESSION: Technically adequate exam showing no pulmonary embolus.  No evidence for acute  abnormality.   Electronically Signed   By: Shon Hale M.D.   On: 03/24/2014 22:00   Assessment/Plan Principal Problem:   Symptomatic anemia Active Problems:   Depression   Adenocarcinoma of cervix, stage 1   GERD (gastroesophageal reflux disease)   Pancytopenia   Cough   Hypokalemia  Symptomatic anemia: Patient has pancytopenia. It is most likely due to bone marrow suppression from chemotherapy and radiation therapy, but still needs to rule out other possibilities, such as hemolysis or GI bleeding, though less likely.  -will admit to tele bed -2 units of blood was ordered by ED -check INR, LDH, FOBTand haptoglobin -repeat CBC q12h -patient was given prescription of iron supplement, but she is not taking it due to GI side effects. Encouraged the patient to discuss with her oncologist.  Cough: Unclear etiology.Patient used to have sore throat which has resolved after started taking Augmentin prescribed by oncologist. She still has mild cough, butno chest pain.  Chest x-ray is negative. Patient does not have fever, but has chills. Given her immunosuppressed condition, I will get blood culture -let patient complete 7-day course of Augmentin -blood culture x 2  Hypokalemia: Potassium is 3.6 -Repleted  -Depression: Not on medications. Stable. No suicidal, or homicidal ideations. -Observe closely.  Cervical cancer-stage I: Patient has been followed up and treated by Dr. Alycia Rossetti in [facility]. Last radiation was 11/6, and last chemotherapy therapy was 11/30. She is scheduled to have CT scan on 12/15. No vaginal bleeding -follow with dr. Alycia Rossetti after discharge.   DVT ppx: SCD  Code Status: Full code Family Communication:   Yes, patient's  mother    at bed side Disposition Plan: Admit to inpatient   Date of Service 03/24/2014    Ivor Costa [facility] Pager [phone]  If 7PM-7AM, please contact night-coverage [email or web address] Password [email or web address] 03/24/2014, 11:55 PM

## 2014-03-24 NOTE — Telephone Encounter (Signed)
Per [name], NP patient called with ANC results from today. Told pt that her [place] is 0.1 today as compared to 0.3 on 03/21/14. Her hemoglobin today was 7.6. Pt denies shortness of breath unless she is walking far distances. She says she is still tired though but denies any fever. She said since starting the antibiotic she is feeling much better. She says she has had blood before but doesn't feel like she needs it now. She is leaving tomorrow at 5am to go to a cheerleading competition in [place] for her daughter. Reminded pt to follow neutropenic precautions while on her trip - especially to wear masks during the cheer competition or whenever she is out in public over the weekend and to bring hand sanitizer and use it frequently. Pt states she does have some masks at home and will bring them with her. Notified pt that if her shortness of breath increases or if she feels worse overall to please go to the nearest emergency room. Pt agreeable to this.

## 2014-03-24 NOTE — ED Provider Notes (Signed)
CSN: [id number]     Arrival date & time 03/24/14  0017 History   First MD Initiated Contact with Patient 03/24/14 1924     Chief Complaint  Patient presents with  . Shortness of Breath     (Consider location/radiation/quality/duration/timing/severity/associated sxs/prior Treatment) HPI  Pt presenting with c/o shortness of breath and being told of labs showing neutropenia and anemia.  She states she was told that if she became short of breath she would need a blood transfusion and to come to the ED.  Later in the day today she devleoped shortness of breath with exertion.  No dizziness or fainting.  She is on chemo and radiation- last chemo was 11/30.  No fever/chills.  She has had recent cold symptoms and was started on augmentin earlier in the week which she states has helped her congestion and cold symptoms.  There are no other associated systemic symptoms, there are no other alleviating or modifying factors.   Past Medical History  Diagnosis Date  . 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 Anxiety   . Depression   . Adenocarcinoma of cervix, stage 1     dx  may 2015   STAGE I B2 (+pet scan pelvic nodes for mets)---  oncologist--  dr Marko Plume and dr Sondra Come--  chemo and external radiation    . GERD (gastroesophageal reflux disease)   . Radiation 10/11/13-11/23/13    whole pelvis 45 gray   Past Surgical History  Procedure Laterality Date  . Wisdom tooth extraction    . Colposcopy    . Diagnostic laparoscopy  07-28-2000    w/ lysis adhesions  . Colonoscopy  07-11-1999  . Abdominal hysterectomy     Family History  Problem Relation Age of Onset  . Heart disease Father   . COPD Father   . Diabetes Father   . Hypertension Father   . Arthritis Father   . Arthritis Mother   . Breast cancer Maternal Aunt   . Diabetes Maternal Grandmother   . Cancer Maternal Grandmother     skin cancer  . Heart disease Maternal Grandmother   . Breast cancer Paternal Grandmother   . Cancer  Maternal Aunt     lung   History  Substance Use Topics  . Smoking status: Former Smoker -- 0.50 packs/day for 2 years    Types: Cigarettes    Quit date: 08/30/2013  . Smokeless tobacco: Never Used  . Alcohol Use: Yes     Comment: occasionally   OB History    Gravida Para Term Preterm AB TAB SAB Ectopic Multiple Living   4 2 2  2 1 1   2      Review of Systems  ROS reviewed and all otherwise negative except for mentioned in HPI    Allergies  Review of patient's allergies indicates no known allergies.  Home Medications   Prior to Admission medications   Medication Sig Start Date End Date Taking? Authorizing Provider  acyclovir (ZOVIRAX) 400 MG tablet Take 1 tablet (400 mg total) by mouth 2 (two) times daily. Begin after 10 day full course dose for suppression of lesions for 1-2 months beyond completition of chemotherapy/RT. 11/05/13  Yes Lennis Marion Downer, MD  amoxicillin-clavulanate (AUGMENTIN) 875-125 MG per tablet Take 1 tablet by mouth 2 (two) times daily. 03/21/14  Yes Melissa D Cross, NP  dexamethasone (DECADRON) 4 MG tablet Take 20 mg by mouth. Per pt, "Take 20mg  the night before and morning of chemo." 01/24/14 07/23/14  Historical Provider, MD  docusate sodium (COLACE) 100 MG capsule Take 100 mg by mouth 2 (two) times daily.    Historical Provider, MD  ferrous fumarate (HEMOCYTE) 325 (106 FE) MG TABS tablet Take 1 tablet daily on an empty stomachwith OJ Patient not taking: Reported on 03/21/2014 12/06/13   Lennis Marion Downer, MD  HYDROcodone-acetaminophen (NORCO/VICODIN) 5-325 MG per tablet Take 2 tablets by mouth every 4 (four) hours as needed (pain.).    Historical Provider, MD  LORazepam (ATIVAN) 0.5 MG tablet Take 1 -2 tablets by mouth or under the tongue every 6 hours as needed for nausea. Patient not taking: Reported on 03/21/2014 11/15/13   Everitt Amber, MD  ondansetron [facility]) 8 MG tablet Take one tablet every 8 hours as needed for nausea. Start the 3rd day after  chemotherapy. Patient not taking: Reported on 03/21/2014 10/11/13   Lennis P Marko Plume, MD   BP 122/79 mmHg  Pulse 80  Temp(Src) 98 F (36.7 C) (Oral)  Resp 18  Ht 5\' 5"  (1.651 m)  Wt 159 lb 11.2 oz (72.439 kg)  BMI 26.58 kg/m2  SpO2 97%  Vitals reviewed Physical Exam  Physical Examination: General appearance - alert, well appearing, and in no distress Mental status - alert, oriented to person, place, and time Eyes - no conjunctival injection, no scleral icterus Mouth - mucous membranes moist, pharynx normal without lesions Chest - clear to auscultation, no wheezes, rales or rhonchi, symmetric air entry, normal respiratory effort Heart - normal rate, regular rhythm, normal S1, S2, no murmurs, rubs, clicks or gallops Abdomen - soft, nontender, nondistended, no masses or organomegaly Extremities - peripheral pulses normal, no pedal edema, no clubbing or cyanosis Skin - normal coloration and turgor, no rashes  ED Course  Procedures (including critical care time)  11:11 PM d/w Dr. Blaine Hamper, triad for admission to telemetry bed.  Temporary admission orders written.   Labs Review Labs Reviewed  CBC - Abnormal; Notable for the following:    WBC 1.1 (*)    RBC 2.36 (*)    Hemoglobin 7.2 (*)    HCT 21.7 (*)    Platelets 132 (*)    All other components within normal limits  BASIC METABOLIC PANEL - Abnormal; Notable for the following:    Potassium 3.6 (*)    All other components within normal limits  HEPATIC FUNCTION PANEL - Abnormal; Notable for the following:    Albumin 3.1 (*)    Total Bilirubin <0.2 (*)    All other components within normal limits  CULTURE, BLOOD (ROUTINE X 2)  CULTURE, BLOOD (ROUTINE X 2)  TROPONIN I  LACTATE DEHYDROGENASE  HAPTOGLOBIN  GLUCOSE, CAPILLARY  CBC  CBC  TYPE AND SCREEN  PREPARE RBC (CROSSMATCH)    Imaging Review Dg Chest 2 View  03/24/2014   CLINICAL DATA:  Increasing shortness of breath. History of cervical cancer. Radiotherapy for cervical  cancer.  EXAM: CHEST  2 VIEW  COMPARISON:  11/19/2013.  FINDINGS: Cardiopericardial silhouette within normal limits. Mediastinal contours normal. Trachea midline. No airspace disease or effusion. Monitoring leads project over the chest.  IMPRESSION: No active cardiopulmonary disease.   Electronically Signed   By: Dereck Ligas M.D.   On: 03/24/2014 20:02   Ct Angio Chest Pe W/cm &/or Wo Cm  03/24/2014   CLINICAL DATA:  Shortness of breath with exertion for 2 days. Chest discomfort. History of cervical cancer. Current chemotherapy. Radiation therapy is complete.  EXAM: CT ANGIOGRAPHY CHEST WITH CONTRAST  TECHNIQUE: Multidetector CT  imaging of the chest was performed using the standard protocol during bolus administration of intravenous contrast. Multiplanar CT image reconstructions and MIPs were obtained to evaluate the vascular anatomy.  CONTRAST:  162mL OMNIPAQUE IOHEXOL 350 MG/ML SOLN  COMPARISON:  Chest x-ray 03/24/2014  FINDINGS: Heart: Heart is normal in size. No pericardial effusion. No significant coronary artery calcification.  Vascular structures: Pulmonary arteries are well opacified. No embolus.  Mediastinum/thyroid: No mediastinal, hilar, or axillary adenopathy. The visualized portion of the thyroid gland has a normal appearance.  Lungs/Airways: Airways are patent. No pulmonary nodules, pleural effusions, or infiltrates.  Upper abdomen: Unremarkable.  Chest wall/osseous structures: Unremarkable.  Review of the MIP images confirms the above findings.  IMPRESSION: Technically adequate exam showing no pulmonary embolus.  No evidence for acute  abnormality.   Electronically Signed   By: Shon Hale M.D.   On: 03/24/2014 22:00     EKG Interpretation   Date/Time:  Thursday March 24 2014 19:11:18 EST Ventricular Rate:  88 PR Interval:  144 QRS Duration: 72 QT Interval:  384 QTC Calculation: 465 R Axis:   91 Text Interpretation:  Sinus rhythm Borderline right axis deviation Low  voltage,  precordial leads No significant change since last tracing  Confirmed by [facility]  MD, MARTHA [phone]) on 03/24/2014 11:02:39 PM      MDM   Final diagnoses:  Shortness of breath  symptomatic anemia Cervical cancer on chemotherapy Neutropenia  Pt with hx of cervical cancer on chemo and radiation presents with anemia and associated shortness of breath.  CT angio of chest negative for PE, transfusion ordered due to symptomatic anemia,  No fever.    Threasa Beards, MD 03/25/14 [phone]

## 2014-03-24 NOTE — ED Notes (Signed)
Delay in getting patient to floor because admitting doctor is in room.

## 2014-03-24 NOTE — ED Notes (Signed)
Patient comes from home after receiving the results of routine blood work at the [facility] this afternoon.  Patient received a call that her Hgb and WBC are both low.  She was instructed to come to ED if she became SOB.  Patient states she became SOB with exertion this evening.  Patient states she has had SOB in past and that, or the anemia aren't new.  On exam, patient has +2 radial and dorsalis pedis pulses. Breath sounds are clear in all lobes.  No S3, S4, murmur or split noted among cardiac sounds.  Patient's abdomen is soft, with some tenderness to palpation of RLQ.  Bowel sounds are normoactive.  Patient denies pain anywhere except RLQ with palpation.  No pretibial edema or ascites noted.

## 2014-03-25 ENCOUNTER — Encounter (HOSPITAL_COMMUNITY): Payer: Self-pay | Admitting: Nurse Practitioner

## 2014-03-25 ENCOUNTER — Other Ambulatory Visit: Payer: Medicaid Other

## 2014-03-25 LAB — CBC
HEMATOCRIT: 28.1 % — AB (ref 36.0–46.0)
HEMOGLOBIN: 9.7 g/dL — AB (ref 12.0–15.0)
MCH: 30.9 pg (ref 26.0–34.0)
MCHC: 34.5 g/dL (ref 30.0–36.0)
MCV: 89.5 fL (ref 78.0–100.0)
Platelets: 114 10*3/uL — ABNORMAL LOW (ref 150–400)
RBC: 3.14 MIL/uL — ABNORMAL LOW (ref 3.87–5.11)
RDW: 15.2 % (ref 11.5–15.5)
WBC: 1 10*3/uL — CL (ref 4.0–10.5)

## 2014-03-25 LAB — GLUCOSE, CAPILLARY: Glucose-Capillary: 85 mg/dL (ref 70–99)

## 2014-03-25 LAB — HEPATIC FUNCTION PANEL
ALK PHOS: 85 U/L (ref 39–117)
ALT: 16 U/L (ref 0–35)
AST: 23 U/L (ref 0–37)
Albumin: 3.1 g/dL — ABNORMAL LOW (ref 3.5–5.2)
Bilirubin, Direct: <0.2 mg/dL (ref 0.0–0.3)
Total Bilirubin: <0.2 mg/dL — ABNORMAL LOW (ref 0.3–1.2)
Total Protein: 6.3 g/dL (ref 6.0–8.3)

## 2014-03-25 LAB — LACTATE DEHYDROGENASE: LDH: 215 U/L (ref 94–250)

## 2014-03-25 LAB — HAPTOGLOBIN: Haptoglobin: 192 mg/dL (ref 45–215)

## 2014-03-25 NOTE — Progress Notes (Signed)
Utilization review completed.  

## 2014-03-25 NOTE — Progress Notes (Signed)
Gynecologic Oncology  Karen Daniels 38 y.o. female  HPI:  Karen Daniels is a 38 y.o. 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     Cervical cancer (RAF-HCC)   08/29/2013 Initial Diagnosis Presented to ED with vaginal discharge. Found to have large friable mass, biopsy showed adenocarcinoma of cervix, no LVSI.   09/07/2013 -  Cancer Staged Saw Dr. Alycia Rossetti in [place]. Exam showed 8cm mass fillign vagina, no parametrial or sidewall involvement - clinical IB2. PET showed positive pelvic LN.   10/11/2013 - 11/23/2013 Radiation EBRT, total of 45 cGy to whole pelvis and additional 9 cGy to PET positive nodes. Suboptimal response, not candidate for HDR.   10/11/2013 - 11/08/2013 Chemotherapy Sensitizing cisplatin 40 mg/m2 x5 cycles. Cycle 6 held due to heavy vaginal bleeding on 11/09/13, requiring admission to hospital and vaginal packing and transfusion of 3U pRBCs. Exam 11/15/13 showed persistent 5cm bulky, friable mass.   12/24/2013 Surgery Abdominal modified radical hysterectomy, bilateral salping-oophorectomy, bilateral pelvic lymphadenectomy.    Given her poor response to chemoradiation, with a persistent 5cm friable cervical mass, the decision was made to proceed with completion hysterectomy.   Procedure:  12/24/13 - Abdominal modified radical hysterectomy, bilateral salpingo-oophorectomy, bilateral pelvic lymphadenectomy.  Intraoperative Findings:  There was an exophytic 5-6 cm cervical tumor with no evidence of parametrial invasion or obvious metastatic disease. The tubes and ovaries appeared normal bilaterally. The appendix appeared normal.  Pathology:  Accession #: [id number]  Diagnosis: A: Uterus, cervix, bilateral tubes and ovaries, parametria, radical hysterectomy and upper vaginectomy  Histologic type:  Mucinous adenocarcinoma  Histologic grade: well differentiated (see comment) Tumor focality/site: (quadrant(s) of cervix) circumferential  Tumor size: (greatest dimension) 5.0 cm  Extent of invasion: Depth: 1.2 cm Wall thickness: 1.8 cm Percent: 71% Lymphovascular space invasion: present  Parametrial soft tissue involvement: negative  Vaginal cuff involvement: positive for invasive adenocarcinoma from approximately 8 to 10 o'clock  Surgical margins:  Invasive: vaginal cuff margins positive from 8 to 10 o'clock  Intraepithelial: not applicable, none seen  Regional lymph nodes (see specimens B and C): Total number involved: 0.  Total number examined: 8 Additional pathologic findings:  Endometrium: atrophy Myometrium: small benign leiomyomata  Right ovary: atrophy  Right fallopian tube: no significant pathologic abnormality, no neoplasm identified Left ovary: atrophy  Left fallopian tube: no significant pathologic abnormality, no neoplasm identified   B: Lymph nodes, right pelvic, regional resection  - No evidence of metastatic disease in five lymph nodes (0/5)   C: Lymph nodes, left pelvic, regional resection  - No evidence of metastatic disease in three lymph nodes (0/3)   Comment: The tumor cells exhibit grade 2 nuclear changes, but some of the atypia may be secondary to radiation effect. Many of the tumor cells are flattened. The infiltrating glands contain abundant mucin.   She last saw Dr. Alycia Rossetti on 03/14/14 at [facility] for her third S3 planned cycles of paclitaxel and carboplatin.  At that time, her CBC did meet treatment criteria but was low. Plan was for her to  be followed expectantly and if her white count goes low, she would be given neutropenic precautions.   Interval History:  She presented to the [facility] Emergency Department the evening of 03/24/14 with shortness of breath.  We spoke with her yesterday evening about her  lab work.  Her ANC was 0.1 as compared to 0.3 on  03/21/14. Her hemoglobin was 7.6.  She denied shortness of breath unless she was walking far distances and felt she did not need to have a blood transfusion arranged at that time.  She reports she began getting ready for a cheerleading competition in [place] and her dyspnea increased then she sought care at the Emergency Room.  2 units of PRBCs were given in the ED.  Chest xray was unremarkable and CT of chest negative for PE.  Blood cultures pending.          Review of Systems  Constitutional: Feels better.  No fever or chills.  Cardiovascular: No chest pain, shortness of breath, or edema.  Pulmonary: Cough improved after Augmentin use.  No wheeze.  Gastrointestinal: No nausea, vomiting, or diarrhea. No bright red blood per rectum or change in bowel movement.  Genitourinary: No frequency, urgency, or dysuria. No vaginal bleeding or discharge.  Musculoskeletal: No myalgia or joint pain. Neurologic: No weakness, numbness, or change in gait.  Psychology: No depression, anxiety, or insomnia.  Current Meds (outpt):  Outpatient Encounter Prescriptions as of 03/21/2014  Medication Sig  . acyclovir (ZOVIRAX) 400 MG tablet Take 1 tablet (400 mg total) by mouth 2 (two) times daily. Begin after 10 day full course dose for suppression of lesions for 1-2 months beyond completition of chemotherapy/RT.  Marland Kitchen dexamethasone (DECADRON) 4 MG tablet Take 20 mg by mouth. Per pt, "Take 20mg  the night before and morning of chemo."  . amoxicillin-clavulanate (AUGMENTIN) 875-125 MG per tablet Take 1 tablet by mouth 2 (two) times daily.  Marland Kitchen docusate sodium (COLACE) 100 MG capsule Take 100 mg by mouth 2 (two) times daily.  . ferrous fumarate (HEMOCYTE) 325 (106 FE) MG TABS tablet Take 1 tablet daily on an empty stomachwith OJ (Patient not taking: Reported on 03/21/2014)  . HYDROcodone-acetaminophen (NORCO/VICODIN) 5-325 MG per tablet Take 2 tablets by mouth every 4 (four) hours as needed (pain.).  Marland Kitchen LORazepam (ATIVAN) 0.5 MG  tablet Take 1 -2 tablets by mouth or under the tongue every 6 hours as needed for nausea. (Patient not taking: Reported on 03/21/2014)  . ondansetron (ZOFRAN) 8 MG tablet Take one tablet every 8 hours as needed for nausea. Start the 3rd day after chemotherapy. (Patient not taking: Reported on 03/21/2014)  . [DISCONTINUED] enoxaparin (LOVENOX) 40 MG/0.4ML injection Inject 40 mg into the skin daily. For 26 days, starting 12/27/2013 and ending 01/22/2014.    Allergy: No Known Allergies  Social Hx:   History   Social History  . Marital Status: Divorced    Spouse Name: N/A    Number of Children: 2  . Years of Education: N/A   Occupational History  .     Social History Main Topics  . Smoking status: Former Smoker -- 0.50 packs/day for 2 years    Types: Cigarettes    Quit date: 08/30/2013  . Smokeless tobacco: Never Used  . Alcohol Use: Yes     Comment: occasionally  . Drug Use: No  . Sexual Activity: Yes   Other Topics Concern  . Not on file   Social History Narrative    Past Surgical Hx:  Past Surgical History  Procedure Laterality Date  . Wisdom tooth extraction    . Colposcopy    . Diagnostic laparoscopy  07-28-2000    w/ lysis adhesions  . Colonoscopy  07-11-1999  . Abdominal hysterectomy      Past Medical  Hx:  Past Medical History  Diagnosis Date  . 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 Anxiety   . Depression   . Adenocarcinoma of cervix, stage 1     dx  may 2015   STAGE I B2 (+pet scan pelvic nodes for mets)---  oncologist--  dr Marko Plume and dr Sondra Come--  chemo and external radiation    . GERD (gastroesophageal reflux disease)   . Radiation 10/11/13-11/23/13    whole pelvis 45 gray    Family Hx:  Family History  Problem Relation Age of Onset  . Heart disease Father   . COPD Father   . Diabetes Father   . Hypertension Father   . Arthritis Father   . Arthritis Mother   . Breast cancer Maternal Aunt   . Diabetes Maternal Grandmother   . Cancer  Maternal Grandmother     skin cancer  . Heart disease Maternal Grandmother   . Breast cancer Paternal Grandmother   . Cancer Maternal Aunt     lung    Vitals:  Blood pressure 114/68, pulse 77, temperature 98.2 F (36.8 C), temperature source Oral, resp. rate 16, height 5\' 5"  (1.651 m), weight 159 lb 11.2 oz (72.439 kg), SpO2 97 %.   CBC    Component Value Date/Time   WBC 1.1* 03/24/2014 2014   WBC 0.9* 03/24/2014 1601   RBC 2.36* 03/24/2014 2014   RBC 2.45* 03/24/2014 1601   HGB 7.2* 03/24/2014 2014   HGB 7.6* 03/24/2014 1601   HCT 21.7* 03/24/2014 2014   HCT 22.3* 03/24/2014 1601   PLT 132* 03/24/2014 2014   PLT 118* 03/24/2014 1601   MCV 91.9 03/24/2014 2014   MCV 91.0 03/24/2014 1601   MCH 30.5 03/24/2014 2014   MCH 31.0 03/24/2014 1601   MCHC 33.2 03/24/2014 2014   MCHC 34.1 03/24/2014 1601   RDW 14.8 03/24/2014 2014   RDW 14.7* 03/24/2014 1601   LYMPHSABS 0.5* 03/24/2014 1601   LYMPHSABS 0.7 01/01/2014 1541   MONOABS 0.4 03/24/2014 1601   MONOABS 0.6 01/01/2014 1541   EOSABS 0.0 03/24/2014 1601   EOSABS 0.1 01/01/2014 1541   BASOSABS 0.0 03/24/2014 1601   BASOSABS 0.0 01/01/2014 1541   CMP Latest Ref Rng 03/25/2014 03/24/2014 03/24/2014  Glucose 70 - 99 mg/dL - 94 95  BUN 6 - 23 mg/dL - 10 9.0  Creatinine 0.50 - 1.10 mg/dL - 0.78 0.8  Sodium 137 - 147 mEq/L - 140 140  Potassium 3.7 - 5.3 mEq/L - 3.6(L) 3.4(L)  Chloride 96 - 112 mEq/L - 99 -  CO2 19 - 32 mEq/L - 29 31(H)  Calcium 8.4 - 10.5 mg/dL - 9.3 9.0  Total Protein 6.0 - 8.3 g/dL 6.3 6.5 -  Total Bilirubin 0.3 - 1.2 mg/dL <0.2(L) 0.20 -  Alkaline Phos 39 - 117 U/L 85 86 -  AST 0 - 37 U/L 23 24 -  ALT 0 - 35 U/L 16 21 -   Blood cultures pending.  Physical Exam:  General: Well developed, well nourished female in no acute distress. Alert and oriented x 3.  Lymph node survey: No cervical, supraclavicular, or inguinal adenopathy.  Cardiovascular: Regular rate and rhythm. S1 and S2 normal.  Lungs:  Clear to auscultation bilaterally. No wheezes/crackles/rhonchi noted.  Skin: No rashes or lesions present. Back: No CVA tenderness. Abdomen: Soft, non-tender, active bowel sounds.  Extremities: No bilateral cyanosis, edema, or clubbing.   Assessment/Plan:   Karen Daniels is a 38 y.o. 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 Dispositioned to vaginal brachytherapy and consideration for further treatment with Carbo/Taxol after completing radiation treatment.  She completed cycle #3 of 3 paclitaxel and carboplatin on 03/14/14 and completed her radiation 2 weeks from that day.  She is currently admitted for shortness of breath and s/p 2 units of PRBCs.  She has improved and would like to be discharged.  Awaiting follow up lab work to be drawn at 17:00.  Dr. Alycia Rossetti currently out of town.  Situation discussed with Dr. Fermin Schwab.  Cleared for discharge from a GYN Oncology standpoint.  Will have repeat labs on Monday if patient discharged.        Tregan Read DEAL, NP 03/25/2014, 4:14 PM

## 2014-03-25 NOTE — Progress Notes (Signed)
Pt transported from ED at this time. NAD. Mother at bedside. VSS. Pt oriented to room, staff, and unit. Will continue to monitor pt. Carnella Gu[place] I

## 2014-03-25 NOTE — Progress Notes (Signed)
CRITICAL VALUE ALERT  Critical value received: WBC 1.0  Date of notification:  03/25/14  Time of notification:  9833  Critical value read back:Yes.    Nurse who received alert:  Tye Savoy RN  MD notified (1st page):  Dr. Doyle Askew  Time of first page:  [phone]  MD notified (2nd page):  Time of second page:  Responding MD:  Dr Doyle Askew  Time MD responded:  [phone]

## 2014-03-25 NOTE — Discharge Instructions (Signed)
Shortness of Breath °Shortness of breath means you have trouble breathing. Shortness of breath needs medical care right away. °HOME CARE  °· Do not smoke. °· Avoid being around chemicals or things (paint fumes, dust) that may bother your breathing. °· Rest as needed. Slowly begin your normal activities. °· Only take medicines as told by your doctor. °· Keep all doctor visits as told. °GET HELP RIGHT AWAY IF:  °· Your shortness of breath gets worse. °· You feel lightheaded, pass out (faint), or have a cough that is not helped by medicine. °· You cough up blood. °· You have pain with breathing. °· You have pain in your chest, arms, shoulders, or belly (abdomen). °· You have a fever. °· You cannot walk up stairs or exercise the way you normally do. °· You do not get better in the time expected. °· You have a hard time doing normal activities even with rest. °· You have problems with your medicines. °· You have any new symptoms. °MAKE SURE YOU: °· Understand these instructions. °· Will watch your condition. °· Will get help right away if you are not doing well or get worse. °Document Released: 09/18/2007 Document Revised: 04/06/2013 Document Reviewed: 06/17/2011 °Exit[employer]® Patient Information ©2015 [employer]. This information is not intended to replace advice given to you by your health care provider. Make sure you discuss any questions you have with your health care provider. ° °

## 2014-03-25 NOTE — Plan of Care (Signed)
Problem: Phase I Progression Outcomes Goal: Absence of IV chemotherapy infiltration Outcome: Completed/Met Date Met:  03/25/14 Goal: IV site with good blood return Outcome: Completed/Met Date Met:  03/25/14

## 2014-03-25 NOTE — Discharge Summary (Signed)
Physician Discharge Summary  Karen Daniels BJY:[id number] DOB: 12/25/1975 DOA: 03/24/2014  PCP: No PCP Per Patient  Admit date: 03/24/2014 Discharge date: 03/25/2014  Recommendations for Outpatient Follow-up:  1. Pt will need to follow up with PCP in 2-3 weeks post discharge 2. Please obtain BMP to evaluate electrolytes and kidney function, potassium level  3. Please also check CBC to evaluate Hg and Hct levels  Discharge Diagnoses:  Principal Problem:   Symptomatic anemia Active Problems:   Depression   Adenocarcinoma of cervix, stage 1   GERD (gastroesophageal reflux disease)   Pancytopenia   Cough   Hypokalemia   Discharge Condition: Stable  Diet recommendation: Heart healthy diet discussed in details   History of present illness:  38 y.o. female with past medical history of GERD, hyperlipidemia, recently diagnosed cervical cancer, who presented with shortness of breath on exertion. She denied chest pain, dizziness. No vaginal bleeding, hematuria or hematochezia. Since patient has been receiving radiation and chemotherapy for cervical cancer, she was told by her oncologist to come to the hospital if she develops shortness of breath.   Of note, she has mild cough and sore throat in the past 2 weeks. She was given prescription of Augmentin by oncologist, which she is supposed to take it for 7 days. She has been taking Augmentin for 3 days, needs to take it for 4 more days. Her sore throat has resolved completely, but she still has mild cough with very minimal amount of sputum production.   Hospital Course:   Symptomatic anemia: Patient has pancytopenia. It is most likely due to bone marrow suppression from chemotherapy and radiation therapy - pt cleared for d/c from oncologist standpoint - blood counts stable  -2 units of blood transfused on admission  Cough: Unclear etiology.Patient used to have sore throat which has resolved after started taking Augmentin prescribed by  oncologist. She still has mild cough, but no chest pain. Chest x-ray is negative. Patient does not have fever, but has chills. Given her immunosuppressed condition, -let patient complete 7-day course of Augmentin -blood culture x 2 NGTD Hypokalemia: Potassium is 3.6, will need to continue supplemented as indicated  Depression: Not on medications. Stable. No suicidal, or homicidal ideations. Cervical cancer-stage I: cleared for d/c from oncologist standpoint    Procedures/Studies: Dg Chest 2 View  03/24/2014   No active cardiopulmonary disease.   Ct Angio Chest Pe W/cm &/or Wo Cm  03/24/2014   No pulmonary embolus.  No evidence for acute  abnormality.    Consultations:  Oncology  Antibiotics:  Augmentin per previous home regimen  Discharge Exam: Filed Vitals:   03/25/14 1616  BP: 122/79  Pulse: 80  Temp: 98 F (36.7 C)  Resp: 18   Filed Vitals:   03/25/14 0515 03/25/14 0547 03/25/14 0759 03/25/14 1616  BP: 101/60 110/74 114/68 122/79  Pulse: 83 78 77 80  Temp: 98.1 F (36.7 C) 98.3 F (36.8 C) 98.2 F (36.8 C) 98 F (36.7 C)  TempSrc: Oral Oral Oral Oral  Resp: 20 16 16 18   Height:      Weight:      SpO2: 97% 100% 97% 97%    General: Pt is alert, follows commands appropriately, not in acute distress Cardiovascular: Regular rate and rhythm, S1/S2 +, no murmurs, no rubs, no gallops Respiratory: Clear to auscultation bilaterally, no wheezing, no crackles, no rhonchi Abdominal: Soft, non tender, non distended, bowel sounds +, no guarding  Discharge Instructions  Discharge Instructions  Diet - low sodium heart healthy    Complete by:  As directed      Increase activity slowly    Complete by:  As directed             Medication List    TAKE these medications        acyclovir 400 MG tablet  Commonly known as:  ZOVIRAX  Take 1 tablet (400 mg total) by mouth 2 (two) times daily. Begin after 10 day full course dose for suppression of lesions for 1-2 months  beyond completition of chemotherapy/RT.     amoxicillin-clavulanate 875-125 MG per tablet  Commonly known as:  AUGMENTIN  Take 1 tablet by mouth 2 (two) times daily.     dexamethasone 4 MG tablet  Commonly known as:  DECADRON  Take 20 mg by mouth. Per pt, "Take 20mg  the night before and morning of chemo."     docusate sodium 100 MG capsule  Commonly known as:  COLACE  Take 100 mg by mouth 2 (two) times daily.     ferrous fumarate 325 (106 FE) MG Tabs tablet  Commonly known as:  HEMOCYTE  Take 1 tablet daily on an empty stomachwith OJ     HYDROcodone-acetaminophen 5-325 MG per tablet  Commonly known as:  NORCO/VICODIN  Take 2 tablets by mouth every 4 (four) hours as needed (pain.).     LORazepam 0.5 MG tablet  Commonly known as:  ATIVAN  Take 1 -2 tablets by mouth or under the tongue every 6 hours as needed for nausea.     ondansetron 8 MG tablet  Commonly known as:  ZOFRAN  Take one tablet every 8 hours as needed for nausea. Start the 3rd day after chemotherapy.           Follow-up Information    Follow up with Faye Ramsay, MD.   Specialty:  Internal Medicine   Why:  As needed, If symptoms worsen, call my cell phone [phone]   Contact information:   [address] [place] [address] [phone]        The results of significant diagnostics from this hospitalization (including imaging, microbiology, ancillary and laboratory) are listed below for reference.     Microbiology: No results found for this or any previous visit (from the past 240 hour(s)).   Labs: Basic Metabolic Panel:  Recent Labs Lab 03/21/14 1503 03/24/14 1601 03/24/14 2014  NA 138 140 140  K 3.1* 3.4* 3.6*  CL  --   --  99  CO2 29 31* 29  GLUCOSE 99 95 94  BUN 12.5 9.0 10  CREATININE 0.8 0.8 0.78  CALCIUM 8.8 9.0 9.3   Liver Function Tests:  Recent Labs Lab 03/21/14 1503 03/24/14 1601 03/25/14 0101  AST 27 24 23   ALT 25 21 16   ALKPHOS 86 86  85  BILITOT 0.27 0.20 <0.2*  PROT 6.6 6.5 6.3  ALBUMIN 3.4* 3.3* 3.1*   CBC:  Recent Labs Lab 03/21/14 1502 03/24/14 1601 03/24/14 2014  WBC 0.8* 0.9* 1.1*  NEUTROABS 0.3* 0.1*  --   HGB 8.2* 7.6* 7.2*  HCT 24.1* 22.3* 21.7*  MCV 90.5 91.0 91.9  PLT 154 118* 132*   Cardiac Enzymes:  Recent Labs Lab 03/24/14 2014  TROPONINI <0.30   CBG:  Recent Labs Lab 03/25/14 6599  GLUCAP 85   SIGNED: Time coordinating discharge: Over 30 minutes  Faye Ramsay, MD  [facility] 03/25/2014, 6:10 PM Pager [phone]  If  7PM-7AM, please contact night-coverage [email or web address] Password [email or web address]

## 2014-03-26 LAB — TYPE AND SCREEN
ABO/RH(D): O POS
ANTIBODY SCREEN: NEGATIVE
Unit division: 0
Unit division: 0

## 2014-03-28 ENCOUNTER — Ambulatory Visit (HOSPITAL_BASED_OUTPATIENT_CLINIC_OR_DEPARTMENT_OTHER): Payer: Medicaid Other

## 2014-03-28 ENCOUNTER — Telehealth: Payer: Self-pay | Admitting: Nurse Practitioner

## 2014-03-28 ENCOUNTER — Other Ambulatory Visit: Payer: Self-pay | Admitting: Gynecologic Oncology

## 2014-03-28 DIAGNOSIS — C539 Malignant neoplasm of cervix uteri, unspecified: Secondary | ICD-10-CM

## 2014-03-28 DIAGNOSIS — D649 Anemia, unspecified: Secondary | ICD-10-CM

## 2014-03-28 LAB — CBC WITH DIFFERENTIAL/PLATELET
BASO%: 0.6 % (ref 0.0–2.0)
Basophils Absolute: 0 10*3/uL (ref 0.0–0.1)
EOS%: 0.8 % (ref 0.0–7.0)
Eosinophils Absolute: 0 10*3/uL (ref 0.0–0.5)
HCT: 31.2 % — ABNORMAL LOW (ref 34.8–46.6)
HGB: 10.4 g/dL — ABNORMAL LOW (ref 11.6–15.9)
LYMPH%: 25.4 % (ref 14.0–49.7)
MCH: 30.2 pg (ref 25.1–34.0)
MCHC: 33.2 g/dL (ref 31.5–36.0)
MCV: 90.9 fL (ref 79.5–101.0)
MONO#: 0.5 10*3/uL (ref 0.1–0.9)
MONO%: 32.2 % — AB (ref 0.0–14.0)
NEUT#: 0.7 10*3/uL — ABNORMAL LOW (ref 1.5–6.5)
NEUT%: 41 % (ref 38.4–76.8)
PLATELETS: 106 10*3/uL — AB (ref 145–400)
RBC: 3.43 10*6/uL — ABNORMAL LOW (ref 3.70–5.45)
RDW: 16.2 % — AB (ref 11.2–14.5)
WBC: 1.7 10*3/uL — ABNORMAL LOW (ref 3.9–10.3)
lymph#: 0.4 10*3/uL — ABNORMAL LOW (ref 0.9–3.3)

## 2014-03-28 LAB — BASIC METABOLIC PANEL (CC13)
ANION GAP: 10 meq/L (ref 3–11)
BUN: 10.6 mg/dL (ref 7.0–26.0)
CALCIUM: 9.1 mg/dL (ref 8.4–10.4)
CO2: 28 mEq/L (ref 22–29)
CREATININE: 0.8 mg/dL (ref 0.6–1.1)
Chloride: 102 mEq/L (ref 98–109)
GLUCOSE: 88 mg/dL (ref 70–140)
POTASSIUM: 3.7 meq/L (ref 3.5–5.1)
Sodium: 140 mEq/L (ref 136–145)

## 2014-03-28 LAB — TECHNOLOGIST REVIEW

## 2014-03-28 NOTE — Telephone Encounter (Signed)
RN called patient to f/u on how she feels after hospital discharge. Pt states "i feel great now."  RN informed patient we would like to check her labs today, she is able to come in at 12 noon. Orders placed per [name], NP and lab appt to be made. Patient verbalizes understanding, she will be here at 12:00 for labs.

## 2014-03-28 NOTE — Telephone Encounter (Signed)
Patient informed potassium 3.7 today and gave results of CBC. Hgb 10.4 and plt 106. Patient formed she is still neutropenic with WBC 1.7 and ANC 0.7, reinforced infection-precautions and instructed patient to call with any changes or concerns. She verbalizes understanding and thanks for the call.

## 2014-03-31 LAB — CULTURE, BLOOD (ROUTINE X 2)
CULTURE: NO GROWTH
Culture: NO GROWTH

## 2014-06-16 ENCOUNTER — Other Ambulatory Visit: Payer: Self-pay | Admitting: *Deleted

## 2014-06-16 ENCOUNTER — Telehealth: Payer: Self-pay | Admitting: *Deleted

## 2014-06-16 DIAGNOSIS — C539 Malignant neoplasm of cervix uteri, unspecified: Secondary | ICD-10-CM

## 2014-06-16 NOTE — Telephone Encounter (Signed)
Called pt regarding PT/Rehab referral appt scheduled for Wed march 9 at 0845am. Unable to reach pt on mobile# called home # [email or web address] for pt to call.

## 2014-06-22 ENCOUNTER — Ambulatory Visit: Payer: Medicaid Other | Attending: Gynecologic Oncology | Admitting: Physical Therapy

## 2014-06-22 DIAGNOSIS — R29898 Other symptoms and signs involving the musculoskeletal system: Secondary | ICD-10-CM | POA: Diagnosis not present

## 2014-06-22 DIAGNOSIS — M6289 Other specified disorders of muscle: Secondary | ICD-10-CM | POA: Insufficient documentation

## 2014-06-22 DIAGNOSIS — M25652 Stiffness of left hip, not elsewhere classified: Secondary | ICD-10-CM | POA: Diagnosis not present

## 2014-06-22 DIAGNOSIS — M79641 Pain in right hand: Secondary | ICD-10-CM | POA: Diagnosis not present

## 2014-06-22 DIAGNOSIS — M25651 Stiffness of right hip, not elsewhere classified: Secondary | ICD-10-CM | POA: Insufficient documentation

## 2014-06-22 DIAGNOSIS — C539 Malignant neoplasm of cervix uteri, unspecified: Secondary | ICD-10-CM | POA: Insufficient documentation

## 2014-06-22 DIAGNOSIS — M79642 Pain in left hand: Secondary | ICD-10-CM

## 2014-06-22 NOTE — Therapy (Signed)
[place], [place], [address] Phone: [phone]   Fax:  [phone]  Physical Therapy Evaluation  Patient Details  Name: Karen Daniels MRN: [id number] Date of Birth: 1975/09/24 Referring Provider:  Nancy Marus, MD  Encounter Date: 06/22/2014      PT End of Session - 06/22/14 2706    Visit Number 1   Number of Visits 4   Date for PT Re-Evaluation 08/21/14   PT Start Time 0845   PT Stop Time 0935   PT Time Calculation (min) 50 min   Behavior During Therapy --  brief episode of tearfulness during session      Past Medical History  Diagnosis Date  . 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 Anxiety   . Depression   . Adenocarcinoma of cervix, stage 1     dx  may 2015   STAGE I B2 (+pet scan pelvic nodes for mets)---  oncologist--  dr Marko Plume and dr Sondra Come--  chemo and external radiation    . GERD (gastroesophageal reflux disease)   . Radiation 10/11/13-11/23/13    whole pelvis 45 gray    Past Surgical History  Procedure Laterality Date  . Wisdom tooth extraction    . Colposcopy    . Diagnostic laparoscopy  07-28-2000    w/ lysis adhesions  . Colonoscopy  07-11-1999  . Abdominal hysterectomy      There were no vitals taken for this visit.  Visit Diagnosis:  Stiffness of hip joint, right - Plan: PT plan of care cert/re-cert  Stiffness of hip joint, left - Plan: PT plan of care cert/re-cert  Weakness of both lower extremities - Plan: PT plan of care cert/re-cert  Weakness of hand - Plan: PT plan of care cert/re-cert  Weakness of both arms - Plan: PT plan of care cert/re-cert  Pain of right hand - Plan: PT plan of care cert/re-cert  Pain of left hand - Plan: PT plan of care cert/re-cert      Subjective Assessment - 06/22/14 0848    Symptoms "I've been having some issues with my legs--painful, stiff; hard to get moving after sitting a while; also at pelvic area.  Hands  and wrists get numb and painful."  Sometimes pain up arms.  Some pain across mid-back.   Pertinent History Cervical cancer diagnosed in May 2015; had seven weeks of chemo-radiation, then radical hysterectomy; then nine weeks of chemo and five more radiation for vaginal wall.  That was completed on 03/30/14.  Has started working out at home (squats, leg lifts, abdominal work) and walking. h/o left rotator cuff tear at age 72 with physical therapy and fairly full recovery.                                                         Patient Stated Goals Feel less stiffness, less discomfort.   Currently in Pain? No/denies   Pain Score 10-Worst pain ever  hand pain to 12/10; legs 8-10/10   Pain Location Other (Comment)  hands, legs, mid-back, neck   Pain Orientation Right;Left   Pain Descriptors / Indicators Sore;Other (Comment);Dull  sore and stiff legs; circulation cut off in hands   Pain Frequency Intermittent   Pain Relieving Factors hands are better early in day, moving  Multiple Pain Sites Yes          [facility] PT Assessment - 06/22/14 0001    Assessment   Medical Diagnosis cervical cancer   Onset Date 08/13/13   Precautions   Precaution Comments cancer precautions   Restrictions   Weight Bearing Restrictions No   Balance Screen   Has the patient fallen in the past 6 months No   Has the patient had a decrease in activity level because of a fear of falling?  No   Is the patient reluctant to leave their home because of a fear of falling?  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   Sensation   Light Touch Appears Intact  tested feet and palms   Posture/Postural Control   Posture/Postural Control Postural limitations   Postural Limitations Forward head   ROM / Strength   AROM / PROM / Strength AROM;Strength   AROM   Overall AROM Comments UEs grossly WNL; LEs with mild limitations and report of stiffness at hips; others WFL, though all LE movements slow.   Strength   Overall Strength Comments UEs grossly 4+/5 throughout (some pain with shoulder abduction); hip flexors 4/5 bilat.; hip extensors right 4/5, left 3+/5; hip abductors 4-/5; ankle dorsiflexors 3+/5 bilat.;    Strength Assessment Site Hand   Right/Left hand Right;Left   Gross Grasp Impaired   Grip (lbs) 22/25/17  dynamometer on second setting   Left Hand Gross Grasp Impaired   Grip (lbs) 30/31/26  dynamometer on second setting   Ambulation/Gait   Ambulation/Gait Yes   Ambulation/Gait Assistance 6: Modified independent (Device/Increase time)   Ambulation Distance (Feet) --  denies SOB; feels endurance is improving                          PT Education - 06/22/14 0937    Education provided Yes   Education Details About [facility] [employer], about limitations of Medicaid coverage, and about free physical therapy through the [employer] program at [place]) Educated Patient   Methods Explanation;Handout   Comprehension Verbalized understanding                [facility] Goals - 06/22/14 [phone]    CC Long Term Goal  #1   Title Patient will be independent in home exercise program for flexibility and strengthening of both legs   Time 3   Period Weeks   Status New   CC Long Term Goal  #2   Title Independent in home exercise program for hand strengthening   Time 3   Period Weeks   Status New   CC Long Term Goal  #3   Title independent in self-management of hand and leg pain   Time 3   Period Weeks    Status New            Plan - 06/22/14 4174    Clinical Impression Statement Patient with significant stiffness, weakness,  pain, and functional limitations s/p chemoradiation for cervical cancer would benefit from therapy to address these issues; will also benefit from participation in [facility] [facility] program, which she was given information about today.   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 declines self-pay option for therapy   PT Treatment/Interventions Therapeutic exercise;Patient/family education;Manual techniques;Electrical Stimulation;Contrast Bath;Moist Heat   PT Next Visit Plan HEP instruction for stretching and strengthening; massage for hands and feet; trial of electrical stimulation for hands and legs;    PT Home Exercise Plan stretching and strengthening   Recommended Other Services [facility]; possibly the free therapy clinic [facility] at [facility], if Medicaid does not authorize her or if she needs more than three sessions   Consulted and Agree with Plan of Care Patient         Problem List Patient Active Problem List   Diagnosis Date Noted  . Symptomatic anemia 03/24/2014  . Pancytopenia 03/24/2014  . Hyperlipidemia 03/24/2014  . Cough 03/24/2014  . Hypokalemia 03/24/2014  . Depression   . Adenocarcinoma of cervix, stage 1   . GERD (gastroesophageal reflux disease)   . Vaginal bleeding 11/09/2013  . Acute blood loss anemia 11/09/2013  . Anemia 11/09/2013  . Cervical cancer 09/07/2013  . Encounter for IUD removal 08/07/2011  . Smoker 08/07/2011  . Oral contraceptive use 08/07/2011    SALISBURY,DONNA 06/22/2014, 9:54 AM  [place], [place], [address] Phone: [phone]   Fax:  [place], PT 06/22/2014 9:55 AM

## 2014-07-26 ENCOUNTER — Other Ambulatory Visit (HOSPITAL_COMMUNITY)
Admission: RE | Admit: 2014-07-26 | Discharge: 2014-07-26 | Disposition: A | Discharge status: Home / Self Care | Payer: Medicaid Other | Source: Ambulatory Visit | Attending: Gynecologic Oncology | Admitting: Gynecologic Oncology

## 2014-07-26 ENCOUNTER — Encounter: Payer: Self-pay | Admitting: Gynecologic Oncology

## 2014-07-26 ENCOUNTER — Other Ambulatory Visit: Payer: Medicaid Other

## 2014-07-26 ENCOUNTER — Ambulatory Visit (HOSPITAL_BASED_OUTPATIENT_CLINIC_OR_DEPARTMENT_OTHER): Payer: Medicaid Other | Admitting: Gynecologic Oncology

## 2014-07-26 ENCOUNTER — Other Ambulatory Visit (HOSPITAL_BASED_OUTPATIENT_CLINIC_OR_DEPARTMENT_OTHER): Payer: Medicaid Other | Admitting: *Deleted

## 2014-07-26 VITALS — BP 122/81 | HR 85 | Temp 98.3°F | Resp 18 | Ht 65.0 in | Wt 157.5 lb

## 2014-07-26 DIAGNOSIS — N898 Other specified noninflammatory disorders of vagina: Secondary | ICD-10-CM

## 2014-07-26 DIAGNOSIS — M549 Dorsalgia, unspecified: Secondary | ICD-10-CM

## 2014-07-26 DIAGNOSIS — C539 Malignant neoplasm of cervix uteri, unspecified: Secondary | ICD-10-CM | POA: Diagnosis present

## 2014-07-26 DIAGNOSIS — A6009 Herpesviral infection of other urogenital tract: Secondary | ICD-10-CM

## 2014-07-26 DIAGNOSIS — R1032 Left lower quadrant pain: Secondary | ICD-10-CM | POA: Diagnosis not present

## 2014-07-26 LAB — URINALYSIS, MICROSCOPIC - CHCC
BILIRUBIN (URINE): NEGATIVE
Blood: NEGATIVE
Glucose: NEGATIVE mg/dL
KETONES: NEGATIVE mg/dL
Nitrite: NEGATIVE
PH: 6 (ref 4.6–8.0)
PROTEIN: NEGATIVE mg/dL
SPECIFIC GRAVITY, URINE: 1.005 (ref 1.003–1.035)
Urobilinogen, UR: 0.2 mg/dL (ref 0.2–1)

## 2014-07-26 LAB — WET PREP, GENITAL
CLUE CELLS WET PREP: NONE SEEN
TRICH WET PREP: NONE SEEN
Yeast Wet Prep HPF POC: NONE SEEN

## 2014-07-26 NOTE — Patient Instructions (Signed)
We will call you with the results from today.  Followup with Dr. Alycia Rossetti as scheduled. Please call our office or [facility] with any further questions or concerns.

## 2014-07-27 ENCOUNTER — Telehealth: Payer: Self-pay | Admitting: Gynecologic Oncology

## 2014-07-27 LAB — URINE CYTOLOGY ANCILLARY ONLY
Chlamydia: NEGATIVE
NEISSERIA GONORRHEA: NEGATIVE
Trichomonas: NEGATIVE

## 2014-07-27 NOTE — Progress Notes (Signed)
Follow Up Note: Gyn-Onc  Karen Daniels 39 y.o. female  CC:  Chief Complaint  Patient presents with  . Vaginal Discharge    follow up  . lower abdominal pain    HPI:  CALYSTA CRAIGO is a 38 y.o. 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     Cervical cancer ([facility])   08/29/2013 Initial Diagnosis Presented to ED with vaginal discharge. Found to have large friable mass, biopsy showed adenocarcinoma of cervix, no LVSI.   09/07/2013 -  Cancer Staged Saw Dr. Alycia Rossetti in [place]. Exam showed 8cm mass fillign vagina, no parametrial or sidewall involvement - clinical IB2. PET showed positive pelvic LN.   10/11/2013 - 11/23/2013 Radiation EBRT, total of 45 cGy to whole pelvis and additional 9 cGy to PET positive nodes. Suboptimal response, not candidate for HDR.   10/11/2013 - 11/08/2013 Chemotherapy Sensitizing cisplatin 40 mg/m2 x5 cycles. Cycle 6 held due to heavy vaginal bleeding on 11/09/13, requiring admission to hospital and vaginal packing and transfusion of 3U pRBCs. Exam 11/15/13 showed persistent 5cm bulky, friable mass.   12/24/2013 Surgery Abdominal modified radical hysterectomy, bilateral salping-oophorectomy, bilateral pelvic lymphadenectomy.   02/22/2014 Radiation boost vaginal brachytherapy   01/24/2014 - 03/14/2014 Chemotherapy 3 cycles paclitaxel and carboplatin    Given her poor response to chemoradiation, with a persistent 5cm friable cervical mass, the decision was made to proceed with completion hysterectomy.   Procedure:  12/24/13 - Abdominal modified radical hysterectomy, bilateral salpingo-oophorectomy, bilateral pelvic lymphadenectomy.  Intraoperative Findings:  There was an exophytic 5-6 cm cervical tumor with no evidence of parametrial invasion or obvious metastatic disease. The tubes and  ovaries appeared normal bilaterally. The appendix appeared normal.  Pathology:  Accession #: [id number]  Diagnosis: A: Uterus, cervix, bilateral tubes and ovaries, parametria, radical hysterectomy and upper vaginectomy  Histologic type: Mucinous adenocarcinoma  Histologic grade: well differentiated (see comment) Tumor focality/site: (quadrant(s) of cervix) circumferential  Tumor size: (greatest dimension) 5.0 cm  Extent of invasion: Depth: 1.2 cm Wall thickness: 1.8 cm Percent: 71% Lymphovascular space invasion: present  Parametrial soft tissue involvement: negative  Vaginal cuff involvement: positive for invasive adenocarcinoma from approximately 8 to 10 o'clock  Surgical margins:  Invasive: vaginal cuff margins positive from 8 to 10 o'clock  Intraepithelial: not applicable, none seen  Regional lymph nodes (see specimens B and C): Total number involved: 0.  Total number examined: 8 Additional pathologic findings:  Endometrium: atrophy Myometrium: small benign leiomyomata  Right ovary: atrophy  Right fallopian tube: no significant pathologic abnormality, no neoplasm identified Left ovary: atrophy  Left fallopian tube: no significant pathologic abnormality, no neoplasm identified   B: Lymph nodes, right pelvic, regional resection  - No evidence of metastatic disease in five lymph nodes (0/5)   C: Lymph nodes, left pelvic, regional resection  - No evidence of metastatic disease in three lymph nodes (0/3)   Comment: The tumor cells exhibit grade 2 nuclear changes, but some of the atypia may be secondary to radiation effect. Many of the tumor cells are flattened. The infiltrating glands contain abundant mucin.   She was dispositioned to vaginal brachytherapy and Carbo/Taxol and is s/p 3 additional cycles of carboplatin which she completed in 11/15 as well as completing her vaginal brachytherapy.  She last saw Dr. Alycia Rossetti on 06/13/14 at [facility] with  an unremarkable examination and  recommendation for follow up in August 2016 with a CT scan at that time.    Interval History:  She presents today for evaluation of left lower quadrant abdominal pain that radiates to her back and watery/greenish vaginal discharge.  She states that the dischrage started Friday and continued all weekend.  She describes it as "a lot of clear discharge" with greenish discharge noted when she wipes.  Her left lower quadrant abdominal pain developed over the weekend as well.  She reports the pain as constant and radiating around to her back.  Tender on palpation per patient.  She states some relief with the use of a heating pad and reports some improvement when laying on that side.  Denies vaginal bleeding.  Voiding without difficulty without dysuria, frequency, urgency, or hematuria.  She went from Wed to Sunday without having a bowel movement but her bowels are moving regularly at this time with no blood visualized.  She reports continued light spotting after intercourse and uses lubricants with little relief of her discomfort.  She does report having a new sexual partner and she is concerned about a potential STI.  No other symptoms reported.  She is also concerned about her symptoms since she had similar symptoms when she was diagnosed.  She also reports mild intermittent shortness of breath that has been present with the warmer weather but no other respiratory symptoms reported.  See below for a detailed review of systems.    Review of Systems  Constitutional: Feels well but concerned about potential recurrence or STI from her new sexual partner. No fever or chills, early satiety, unintentional weight loss or gain.  Cardiovascular: Mild shortness of breath.  No chest pain or edema.  Pulmonary: No cough or wheeze.  Gastrointestinal: No nausea, vomiting, or diarrhea. No bright red blood per rectum or change in bowel movement.  Genitourinary: Positive for clear/greenish vaginal discharge.  No frequency,  urgency, or dysuria. No vaginal bleeding.  Musculoskeletal: No myalgia or joint pain. Neurologic: No weakness, numbness, or change in gait.  Psychology: No depression, anxiety, or insomnia.  Current Meds:  Outpatient Encounter Prescriptions as of 07/26/2014  Medication Sig  . acyclovir (ZOVIRAX) 400 MG tablet Take 400 mg by mouth 2 (two) times daily.   . [DISCONTINUED] acyclovir (ZOVIRAX) 400 MG tablet Take 1 tablet (400 mg total) by mouth 2 (two) times daily. Begin after 10 day full course dose for suppression of lesions for 1-2 months beyond completition of chemotherapy/RT. (Patient not taking: Reported on 07/26/2014)  . [DISCONTINUED] amoxicillin-clavulanate (AUGMENTIN) 875-125 MG per tablet Take 1 tablet by mouth 2 (two) times daily. (Patient not taking: Reported on 06/22/2014)  . [DISCONTINUED] docusate sodium (COLACE) 100 MG capsule Take 100 mg by mouth 2 (two) times daily.  . [DISCONTINUED] ferrous fumarate (HEMOCYTE) 325 (106 FE) MG TABS tablet Take 1 tablet daily on an empty stomachwith OJ (Patient not taking: Reported on 03/21/2014)  . [DISCONTINUED] HYDROcodone-acetaminophen (NORCO/VICODIN) 5-325 MG per tablet Take 2 tablets by mouth every 4 (four) hours as needed (pain.).  . [DISCONTINUED] LORazepam (ATIVAN) 0.5 MG tablet Take 1 -2 tablets by mouth or under the tongue every 6 hours as needed for nausea. (Patient not taking: Reported on 06/22/2014)  . [DISCONTINUED] ondansetron (ZOFRAN) 8 MG tablet Take one tablet every 8 hours as needed for nausea. Start the 3rd day after chemotherapy. (Patient not taking: Reported on 03/21/2014)    Allergy: No Known Allergies  Social Hx:   History  Social History  . Marital Status: Divorced    Spouse Name: N/A  . Number of Children: 2  . Years of Education: N/A   Occupational History  .     Social History Main Topics  . Smoking status: Former Smoker -- 0.50 packs/day for 2 years    Types: Cigarettes    Quit date: 08/30/2013  . Smokeless  tobacco: Never Used  . Alcohol Use: Yes     Comment: occasionally  . Drug Use: No  . Sexual Activity: Yes   Other Topics Concern  . Not on file   Social History Narrative    Past Surgical Hx:  Past Surgical History  Procedure Laterality Date  . Wisdom tooth extraction    . Colposcopy    . Diagnostic laparoscopy  07-28-2000    w/ lysis adhesions  . Colonoscopy  07-11-1999  . Abdominal hysterectomy      Past Medical Hx:  Past Medical History  Diagnosis Date  . 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 Anxiety   . Depression   . Adenocarcinoma of cervix, stage 1     dx  may 2015   STAGE I B2 (+pet scan pelvic nodes for mets)---  oncologist--  dr Marko Plume and dr Sondra Come--  chemo and external radiation    . GERD (gastroesophageal reflux disease)   . Radiation 10/11/13-11/23/13    whole pelvis 45 gray    Family Hx:  Family History  Problem Relation Age of Onset  . Heart disease Father   . COPD Father   . Diabetes Father   . Hypertension Father   . Arthritis Father   . Arthritis Mother   . Breast cancer Maternal Aunt   . Diabetes Maternal Grandmother   . Cancer Maternal Grandmother     skin cancer  . Heart disease Maternal Grandmother   . Breast cancer Paternal Grandmother   . Cancer Maternal Aunt     lung    Vitals:  Blood pressure 122/81, pulse 85, temperature 98.3 F (36.8 C), temperature source Oral, resp. rate 18, height 5\' 5"  (1.651 m), weight 157 lb 8 oz (71.442 kg).   CT scan 03/29/14 at [facility] that revealed:  IMPRESSION: 1. Mild stranding and trace free fluid in the pelvis and presacral space as well as mild rectosigmoid colitis and cystitis are likely postradiation and post surgical changes.   2. No residual cervical mass identified.   3. No evidence of metastatic disease although sensitivity is decreased due to inflammatory changes. Recommend attention on follow up.  Pap at [facility] from 05/20/2014: Accession #:  [id number] RESULTS/INTERPRETATION Negative for intraepithelial lesion and malignancy.    Physical Exam:  General: Well developed, well nourished female in no acute distress. Alert and oriented x 3.  Neck: Supple without any enlargements.  Lymph node survey: No cervical, supraclavicular, or inguinal adenopathy.  Cardiovascular: Regular rate and rhythm. S1 and S2 normal.  Lungs: Clear to auscultation bilaterally. No wheezes/crackles/rhonchi noted.  Skin: No rashes or lesions present. Back: Mild CVA tenderness over the left side.  Abdomen: Abdomen soft, non-obese. Tender with light palpation of the left lower quadrant.  Active bowel sounds in all quadrants. No evidence of a fluid wave or abdominal masses.  Genitourinary:    Vulva/vagina: Normal external female genitalia. No lesions.  Moderate amount of clear, watery discharge on the vulva.    Urethra: No lesions or masses.    Vagina: Yellow/greenish discharge noted in the vagina.  Swab taken.  No  visible lesions in the vagina or at the vaginal cuff. No palpable masses. No vaginal bleeding. Extremities: No bilateral cyanosis, edema, or clubbing.   Assessment/Plan:   WILSON SAMPLE is a 39 y.o. 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 Dispositioned to vaginal brachytherapy and consideration for further treatment with Carbo/Taxol after completing radiation treatment.  She completed cycle #3 of 3 paclitaxel and carboplatin on 03/14/14 and completed her radiation 2 weeks from that day.  Urina sample obtained for urinalysis and STI testing.  Wet prep ordered.  Dr. Alycia Rossetti made aware of the situation.  She will be contacted with the results of her testing.  Refusing pain medication prescription at this time.  Reportable signs and symptoms reviewed.  She is advised to call for any questions or concerns.       CROSS, MELISSA DEAL, NP 07/27/2014, 3:01 PM

## 2014-07-27 NOTE — Telephone Encounter (Signed)
Called and spoke with the patient about symptoms.  Reporting the abdominal pain as "about the same" and the vaginal discharge as moderate amounts and watery.  Informed that Dr. Alycia Rossetti is agreeable with proceeding with a CT scan if she is concerned about recurrence due to her symptoms.  Awaiting urine testing for G C but may take one to two more days.  She is aware of this and would like to wait at this time until the results are available before deciding about a CT scan.  She is to call the office if the pain worsens, and for any other symptoms so the scan can be arranged.  Reportable signs and symptoms reviewed.  She is advised to call the office for any concerns and she will be contacted as soon as the results are available.

## 2014-07-28 ENCOUNTER — Ambulatory Visit (HOSPITAL_COMMUNITY)
Admission: RE | Admit: 2014-07-28 | Discharge: 2014-07-28 | Disposition: A | Discharge status: Home / Self Care | Payer: Medicaid Other | Source: Ambulatory Visit | Attending: Gynecologic Oncology | Admitting: Gynecologic Oncology

## 2014-07-28 ENCOUNTER — Telehealth: Payer: Self-pay | Admitting: Gynecologic Oncology

## 2014-07-28 DIAGNOSIS — N898 Other specified noninflammatory disorders of vagina: Secondary | ICD-10-CM

## 2014-07-28 DIAGNOSIS — R0602 Shortness of breath: Secondary | ICD-10-CM

## 2014-07-28 DIAGNOSIS — C539 Malignant neoplasm of cervix uteri, unspecified: Secondary | ICD-10-CM

## 2014-07-28 LAB — URINE CYTOLOGY ANCILLARY ONLY
BACTERIAL VAGINITIS: NEGATIVE
CANDIDA VAGINITIS: NEGATIVE

## 2014-07-28 MED ORDER — IOHEXOL 300 MG/ML  SOLN
100.0000 mL | Freq: Once | INTRAMUSCULAR | Status: AC | PRN
  Administered 2014-07-28: 100 mL via INTRAVENOUS

## 2014-07-28 MED ORDER — IOHEXOL 300 MG/ML  SOLN
50.0000 mL | Freq: Once | INTRAMUSCULAR | Status: AC | PRN
  Administered 2014-07-28: 50 mL via ORAL

## 2014-07-28 MED ORDER — METRONIDAZOLE 500 MG PO TABS: 500.0000 mg | ORAL_TABLET | Freq: Two times a day (BID) | ORAL | Status: DC

## 2014-07-28 NOTE — Telephone Encounter (Signed)
Discussed results with patient and Dr. Elenora Gamma recommendations to proceed with a CT scan to rule out recurrence/fistula and chest xray to evaluate new dyspnea.  Also going to treat with flagyl for seven days per Dr. Alycia Rossetti due to the moderate amount of WBC present on wet prep.

## 2014-09-03 ENCOUNTER — Emergency Department (HOSPITAL_BASED_OUTPATIENT_CLINIC_OR_DEPARTMENT_OTHER): Payer: Medicaid Other

## 2014-09-03 ENCOUNTER — Encounter (HOSPITAL_BASED_OUTPATIENT_CLINIC_OR_DEPARTMENT_OTHER): Payer: Self-pay

## 2014-09-03 ENCOUNTER — Emergency Department (HOSPITAL_BASED_OUTPATIENT_CLINIC_OR_DEPARTMENT_OTHER)
Admission: EM | Admit: 2014-09-03 | Discharge: 2014-09-03 | Disposition: A | Discharge status: Home / Self Care | Payer: Medicaid Other | Attending: Emergency Medicine | Admitting: Emergency Medicine

## 2014-09-03 DIAGNOSIS — R609 Edema, unspecified: Secondary | ICD-10-CM

## 2014-09-03 DIAGNOSIS — Z8719 Personal history of other diseases of the digestive system: Secondary | ICD-10-CM | POA: Insufficient documentation

## 2014-09-03 DIAGNOSIS — M79605 Pain in left leg: Secondary | ICD-10-CM | POA: Insufficient documentation

## 2014-09-03 DIAGNOSIS — Z8659 Personal history of other mental and behavioral disorders: Secondary | ICD-10-CM | POA: Insufficient documentation

## 2014-09-03 DIAGNOSIS — R0602 Shortness of breath: Secondary | ICD-10-CM | POA: Diagnosis not present

## 2014-09-03 DIAGNOSIS — Z87891 Personal history of nicotine dependence: Secondary | ICD-10-CM | POA: Insufficient documentation

## 2014-09-03 DIAGNOSIS — Z79899 Other long term (current) drug therapy: Secondary | ICD-10-CM | POA: Insufficient documentation

## 2014-09-03 DIAGNOSIS — Z8619 Personal history of other infectious and parasitic diseases: Secondary | ICD-10-CM | POA: Diagnosis not present

## 2014-09-03 DIAGNOSIS — Z86018 Personal history of other benign neoplasm: Secondary | ICD-10-CM | POA: Diagnosis not present

## 2014-09-03 LAB — CBC WITH DIFFERENTIAL/PLATELET
BASOS ABS: 0 10*3/uL (ref 0.0–0.1)
Basophils Relative: 0 % (ref 0–1)
EOS PCT: 2 % (ref 0–5)
Eosinophils Absolute: 0.1 10*3/uL (ref 0.0–0.7)
HEMATOCRIT: 31.6 % — AB (ref 36.0–46.0)
Hemoglobin: 10.6 g/dL — ABNORMAL LOW (ref 12.0–15.0)
LYMPHS ABS: 1 10*3/uL (ref 0.7–4.0)
Lymphocytes Relative: 24 % (ref 12–46)
MCH: 31.8 pg (ref 26.0–34.0)
MCHC: 33.5 g/dL (ref 30.0–36.0)
MCV: 94.9 fL (ref 78.0–100.0)
MONOS PCT: 11 % (ref 3–12)
Monocytes Absolute: 0.4 10*3/uL (ref 0.1–1.0)
NEUTROS PCT: 63 % (ref 43–77)
Neutro Abs: 2.7 10*3/uL (ref 1.7–7.7)
Platelets: 250 10*3/uL (ref 150–400)
RBC: 3.33 MIL/uL — ABNORMAL LOW (ref 3.87–5.11)
RDW: 11.8 % (ref 11.5–15.5)
WBC: 4.2 10*3/uL (ref 4.0–10.5)

## 2014-09-03 LAB — COMPREHENSIVE METABOLIC PANEL
ALT: 29 U/L (ref 14–54)
AST: 24 U/L (ref 15–41)
Albumin: 3.8 g/dL (ref 3.5–5.0)
Alkaline Phosphatase: 90 U/L (ref 38–126)
Anion gap: 8 (ref 5–15)
BILIRUBIN TOTAL: 0.1 mg/dL — AB (ref 0.3–1.2)
BUN: 11 mg/dL (ref 6–20)
CO2: 26 mmol/L (ref 22–32)
Calcium: 9.2 mg/dL (ref 8.9–10.3)
Chloride: 105 mmol/L (ref 101–111)
Creatinine, Ser: 0.84 mg/dL (ref 0.44–1.00)
GFR calc Af Amer: >60 mL/min (ref >60)
GFR calc non Af Amer: >60 mL/min (ref >60)
Glucose, Bld: 97 mg/dL (ref 65–99)
POTASSIUM: 3.7 mmol/L (ref 3.5–5.1)
SODIUM: 139 mmol/L (ref 135–145)
TOTAL PROTEIN: 7.2 g/dL (ref 6.5–8.1)

## 2014-09-03 LAB — URINALYSIS, ROUTINE W REFLEX MICROSCOPIC
Bilirubin Urine: NEGATIVE
Glucose, UA: NEGATIVE mg/dL
Hgb urine dipstick: NEGATIVE
Ketones, ur: NEGATIVE mg/dL
NITRITE: NEGATIVE
PH: 7.5 (ref 5.0–8.0)
Protein, ur: NEGATIVE mg/dL
Specific Gravity, Urine: 1.011 (ref 1.005–1.030)
Urobilinogen, UA: 0.2 mg/dL (ref 0.0–1.0)

## 2014-09-03 LAB — URINE MICROSCOPIC-ADD ON

## 2014-09-03 LAB — APTT: aPTT: 26 seconds (ref 24–37)

## 2014-09-03 LAB — PROTIME-INR
INR: 0.96 (ref 0.00–1.49)
PROTHROMBIN TIME: 13 s (ref 11.6–15.2)

## 2014-09-03 LAB — TROPONIN I

## 2014-09-03 MED ORDER — IOHEXOL 350 MG/ML SOLN
100.0000 mL | Freq: Once | INTRAVENOUS | Status: AC | PRN
  Administered 2014-09-03: 100 mL via INTRAVENOUS

## 2014-09-03 MED ORDER — SODIUM CHLORIDE 0.9 % IV BOLUS (SEPSIS)
1000.0000 mL | Freq: Once | INTRAVENOUS | Status: AC
  Administered 2014-09-03: 1000 mL via INTRAVENOUS

## 2014-09-03 NOTE — ED Notes (Signed)
No swelling noted in either Rt or Lt ankles

## 2014-09-03 NOTE — ED Notes (Signed)
Presents to ED w/ c/o "severe" pain in Left calf, with SOB, also upper middle back pain, radiates into Left shoulder blade, x 4 days.

## 2014-09-03 NOTE — ED Notes (Addendum)
Pt reports pain in L posterior calf x 4 days, sob x 2 wks and 1 mo of pain in lower posterior rib area into shoulderblade area.  Currently speaking in full sentences without difficulty.  Swelling bilateral in feet/ankles x 1 month.

## 2014-09-03 NOTE — Discharge Instructions (Signed)
Please follow with your primary care doctor in the next 2 days for a check-up. They must obtain records for further management.   Do not hesitate to return to the Emergency Department for any new, worsening or concerning symptoms.    Shortness of Breath Shortness of breath means you have trouble breathing. It could also mean that you have a medical problem. You should get immediate medical care for shortness of breath. CAUSES   Not enough oxygen in the air such as with high altitudes or a smoke-filled room.  Certain lung diseases, infections, or problems.  Heart disease or conditions, such as angina or heart failure.  Low red blood cells (anemia).  Poor physical fitness, which can cause shortness of breath when you exercise.  Chest or back injuries or stiffness.  Being overweight.  Smoking.  Anxiety, which can make you feel like you are not getting enough air. DIAGNOSIS  Serious medical problems can often be found during your physical exam. Tests may also be done to determine why you are having shortness of breath. Tests may include:  Chest X-rays.  Lung function tests.  Blood tests.  An electrocardiogram (ECG).  An ambulatory electrocardiogram. An ambulatory ECG records your heartbeat patterns over a 24-hour period.  Exercise testing.  A transthoracic echocardiogram (TTE). During echocardiography, sound waves are used to evaluate how blood flows through your heart.  A transesophageal echocardiogram (TEE).  Imaging scans. Your health care provider may not be able to find a cause for your shortness of breath after your exam. In this case, it is important to have a follow-up exam with your health care provider as directed.  TREATMENT  Treatment for shortness of breath depends on the cause of your symptoms and can vary greatly. HOME CARE INSTRUCTIONS   Do not smoke. Smoking is a common cause of shortness of breath. If you smoke, ask for help to quit.  Avoid being  around chemicals or things that may bother your breathing, such as paint fumes and dust.  Rest as needed. Slowly resume your usual activities.  If medicines were prescribed, take them as directed for the full length of time directed. This includes oxygen and any inhaled medicines.  Keep all follow-up appointments as directed by your health care provider. SEEK MEDICAL CARE IF:   Your condition does not improve in the time expected.  You have a hard time doing your normal activities even with rest.  You have any new symptoms. SEEK IMMEDIATE MEDICAL CARE IF:   Your shortness of breath gets worse.  You feel light-headed, faint, or develop a cough not controlled with medicines.  You start coughing up blood.  You have pain with breathing.  You have chest pain or pain in your arms, shoulders, or abdomen.  You have a fever.  You are unable to walk up stairs or exercise the way you normally do. MAKE SURE YOU:  Understand these instructions.  Will watch your condition.  Will get help right away if you are not doing well or get worse. Document Released: 12/25/2000 Document Revised: 04/06/2013 Document Reviewed: 06/17/2011 [facility] Patient Information 2015 [place], [place]. This information is not intended to replace advice given to you by your health care provider. Make sure you discuss any questions you have with your health care provider.

## 2014-09-03 NOTE — ED Notes (Signed)
Family at bedside. 

## 2014-09-03 NOTE — ED Provider Notes (Signed)
CSN: [id number]     Arrival date & time 09/03/14  1235 History   First MD Initiated Contact with Patient 09/03/14 1336     Chief Complaint  Patient presents with  . Shortness of Breath     (Consider location/radiation/quality/duration/timing/severity/associated sxs/prior Treatment) HPI  KIP KAUTZMAN is a 39 y.o. female complaining of beer left calf pain worsening over the course of 4 days associated with swelling which is now largely resolved. Patient had her last chemotherapy for cervical cancer in early December 2015. She had a recent trip to [place], she flew by air about one hour at the end of April. She reports using Premarin vaginal cream which she has not utilized in the last 5 days. She reports a right breast pain, nonpleuritic, non-positional which she noticed about 2 weeks ago. It's 5 out of 10 at worst, 0 out of 10 right now. She states that the chest pain is exacerbated by exertion.No pain medication taken prior to arrival. Associated symptoms of shortness of breath and nausea with no syncope, cough, fever, chills, history of DVT or PE, history of diabetes, hypertension, hyperlipidemia, family history of ACS. She rates the Pain at 10 out of 10 at worst, 7 out of 10 right now. States that the calf pain is exacerbated at nighttime  Past Medical History  Diagnosis Date  . 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 Anxiety   . Depression   . Adenocarcinoma of cervix, stage 1     dx  may 2015   STAGE I B2 (+pet scan pelvic nodes for mets)---  oncologist--  dr Marko Plume and dr Sondra Come--  chemo and external radiation    . GERD (gastroesophageal reflux disease)   . Radiation 10/11/13-11/23/13    whole pelvis 45 gray   Past Surgical History  Procedure Laterality Date  . Wisdom tooth extraction    . Colposcopy    . Diagnostic laparoscopy  07-28-2000    w/ lysis adhesions  . Colonoscopy  07-11-1999  . Abdominal hysterectomy     Family History  Problem Relation Age of Onset   . Heart disease Father   . COPD Father   . Diabetes Father   . Hypertension Father   . Arthritis Father   . Arthritis Mother   . Breast cancer Maternal Aunt   . Diabetes Maternal Grandmother   . Cancer Maternal Grandmother     skin cancer  . Heart disease Maternal Grandmother   . Breast cancer Paternal Grandmother   . Cancer Maternal Aunt     lung   History  Substance Use Topics  . Smoking status: Former Smoker -- 0.50 packs/day for 2 years    Types: Cigarettes    Quit date: 08/30/2013  . Smokeless tobacco: Never Used  . Alcohol Use: Yes     Comment: occasionally   OB History    Gravida Para Term Preterm AB TAB SAB Ectopic Multiple Living   4 2 2  2 1 1   2      Review of Systems  10 systems reviewed and found to be negative, except as noted in the HPI.   Allergies  Review of patient's allergies indicates no known allergies.  Home Medications   Prior to Admission medications   Medication Sig Start Date End Date Taking? Authorizing Provider  conjugated estrogens (PREMARIN) vaginal cream Place 1 Applicatorful vaginally daily.   Yes Historical Provider, MD  acyclovir (ZOVIRAX) 400 MG tablet Take 400 mg by mouth 2 (two)  times daily.     Historical Provider, MD  metroNIDAZOLE (FLAGYL) 500 MG tablet Take 1 tablet (500 mg total) by mouth 2 (two) times daily. 07/28/14   Melissa D Cross, NP   BP 104/64 mmHg  Pulse 73  Temp(Src) 97.6 F (36.4 C) (Oral)  Resp 18  Ht 5\' 5"  (1.651 m)  Wt 157 lb 8 oz (71.442 kg)  BMI 26.21 kg/m2  SpO2 99%  LMP 11/16/2013 Physical Exam  Constitutional: She is oriented to person, place, and time. She appears well-developed and well-nourished. No distress.  HENT:  Head: Normocephalic.  Mouth/Throat: Oropharynx is clear and moist.  Eyes: Conjunctivae and EOM are normal.  Neck: Normal range of motion. No JVD present. No tracheal deviation present.  Cardiovascular: Normal rate, regular rhythm and intact distal pulses.   Radial pulse equal  bilaterally  Pulmonary/Chest: Effort normal and breath sounds normal. No stridor. No respiratory distress. She has no wheezes. She has no rales. She exhibits no tenderness.  Abdominal: Soft. She exhibits no distension and no mass. There is no tenderness. There is no rebound and no guarding.  Musculoskeletal: Normal range of motion. She exhibits edema and tenderness.  Very slight calf asymmetry with left calf larger than right superficial collaterals, palpable cords, edema, Homans sign negative bilaterally.    Neurological: She is alert and oriented to person, place, and time.  Skin: Skin is warm. She is not diaphoretic.  Psychiatric: She has a normal mood and affect.  Nursing note and vitals reviewed.   ED Course  Procedures (including critical care time) Labs Review Labs Reviewed  URINALYSIS, ROUTINE W REFLEX MICROSCOPIC - Abnormal; Notable for the following:    Leukocytes, UA SMALL (*)    All other components within normal limits  CBC WITH DIFFERENTIAL/PLATELET - Abnormal; Notable for the following:    RBC 3.33 (*)    Hemoglobin 10.6 (*)    HCT 31.6 (*)    All other components within normal limits  COMPREHENSIVE METABOLIC PANEL - Abnormal; Notable for the following:    Total Bilirubin 0.1 (*)    All other components within normal limits  URINE MICROSCOPIC-ADD ON  TROPONIN I  PROTIME-INR  APTT    Imaging Review Dg Chest 2 View  09/03/2014   CLINICAL DATA:  Two week history of shortness of breath and upper back pain. Prior history of cervical cancer for which the patient underwent abdominal hysterectomy and radiation therapy.  EXAM: CHEST  2 VIEW  COMPARISON:  07/28/2014 and earlier.  FINDINGS: Cardiomediastinal silhouette unremarkable, unchanged. Lungs clear. Bronchovascular markings normal. Pulmonary vascularity normal. No visible pleural effusions. No pneumothorax. Visualized bony thorax intact.  IMPRESSION: Normal examination.   Electronically Signed   By: Evangeline Dakin M.D.    On: 09/03/2014 13:17   Ct Angio Chest Pe W/cm &/or Wo Cm  09/03/2014   CLINICAL DATA:  Two week history of shortness of breath. One month history of right posterior chest pain. Four day history of left lower extremity edema. Personal history of cervical cancer for which she underwent hysterectomy, radiation therapy and chemotherapy.  EXAM: CT ANGIOGRAPHY CHEST WITH CONTRAST  TECHNIQUE: Multidetector CT imaging of the chest was performed using the standard protocol during bolus administration of intravenous contrast. Multiplanar CT image reconstructions and MIPs were obtained to evaluate the vascular anatomy.  CONTRAST:  154mL OMNIPAQUE IOHEXOL 350 MG/ML IV.  COMPARISON:  CTA chest 03/24/2014.  PET-CT 09/20/2013.  FINDINGS: Contrast opacification of the pulmonary arteries is very good. No filling defects  within either main pulmonary artery or their branches in either lung to suggest pulmonary embolism. Heart size normal. No visible coronary atherosclerosis. No pericardial effusion. No visible atherosclerosis involving the thoracic or upper abdominal aorta or their visualized branches.  Pulmonary parenchyma clear without localized airspace consolidation, interstitial disease, or parenchymal nodules or masses. Central airways patent without significant bronchial wall thickening. No pleural effusions.  No significant mediastinal, hilar or axillary lymphadenopathy. Residual thymic tissue in the anterior superior mediastinum. Visualized thyroid gland unremarkable.  Visualized upper abdomen unremarkable for the early arterial phase of enhancement. Bone window images unremarkable.  Review of the MIP images confirms the above findings.  IMPRESSION: 1. No evidence of pulmonary embolism. 2.  No acute cardiopulmonary disease.   Electronically Signed   By: Evangeline Dakin M.D.   On: 09/03/2014 16:33   US Venous Img Lower Unilateral Left  09/03/2014   CLINICAL DATA:  Left calf pain posteriorly for 3 days.  EXAM: LEFT  LOWER EXTREMITY VENOUS DOPPLER ULTRASOUND  TECHNIQUE: Gray-scale sonography with graded compression, as well as color Doppler and duplex ultrasound were performed to evaluate the lower extremity deep venous systems from the level of the common femoral vein and including the common femoral, femoral, profunda femoral, popliteal and calf veins including the posterior tibial, peroneal and gastrocnemius veins when visible. The superficial great saphenous vein was also interrogated. Spectral Doppler was utilized to evaluate flow at rest and with distal augmentation maneuvers in the common femoral, femoral and popliteal veins.  COMPARISON:  None.  FINDINGS: Contralateral Common Femoral Vein: Respiratory phasicity is normal and symmetric with the symptomatic side. No evidence of thrombus. Normal compressibility.  Common Femoral Vein: No evidence of thrombus. Normal compressibility, respiratory phasicity and response to augmentation.  Saphenofemoral Junction: No evidence of thrombus. Normal compressibility and flow on color Doppler imaging.  Profunda Femoral Vein: No evidence of thrombus. Normal compressibility and flow on color Doppler imaging.  Femoral Vein: No evidence of thrombus. Normal compressibility, respiratory phasicity and response to augmentation.  Popliteal Vein: No evidence of thrombus. Normal compressibility, respiratory phasicity and response to augmentation.  Calf Veins: No evidence of thrombus. Normal compressibility and flow on color Doppler imaging.  Superficial Great Saphenous Vein: No evidence of thrombus. Normal compressibility and flow on color Doppler imaging.  Venous Reflux:  None.  Other Findings:  None.  IMPRESSION: No evidence of deep venous thrombosis.   Electronically Signed   By: Rolm Baptise M.D.   On: 09/03/2014 15:46     EKG Interpretation   Date/Time:  Saturday Sep 03 2014 12:44:57 EDT Ventricular Rate:  79 PR Interval:  156 QRS Duration: 86 QT Interval:  392 QTC Calculation:  449 R Axis:   88 Text Interpretation:  Normal sinus rhythm with sinus arrhythmia Normal ECG  since last tracing no significant change Confirmed by BELFI  MD, MELANIE  ([id number]) on 09/03/2014 1:03:02 PM      MDM   Final diagnoses:  SOB (shortness of breath)  Swelling  Left leg pain    Filed Vitals:   09/03/14 1240 09/03/14 1503 09/03/14 1600  BP: 122/83 111/71 104/64  Pulse: 83 82 73  Temp: 97.6 F (36.4 C)    TempSrc: Oral    Resp: 18 18   Height: 5\' 5"  (1.651 m)    Weight: 157 lb 8 oz (71.442 kg)    SpO2: 100% 99% 99%    Medications  sodium chloride 0.9 % bolus 1,000 mL (1,000 mLs Intravenous New Bag/Given 09/03/14 1454)  iohexol (OMNIPAQUE) 350 MG/ML injection 100 mL (100 mLs Intravenous Contrast Given 09/03/14 1611)    FOTINI LEMUS is a pleasant 38 y.o. female presenting with severe left calf pain, resolved swelling, chest pain, shortness of breath. Patient had chemotherapy in the last 6 months, small recent trip, estrogen supplementation with Premarin vaginal cream. She is high risk for DVT/PE, we'll obtain basic blood work, ultrasound of leg, will likely need CT angiogram PE. She has declined pain medication. Patient is low risk by heart score.  No anemia seen on CBC, electrolytes normal, troponin is negative and EKG is nonischemic. Venous duplex is negative, discussed with patient I think she will need a CT angiogram, patient agrees, again offered pain medication which she has declined.  CT with no acute abnormalities. Patient will follow with her primary care, offered pain medication at discharge and she has declined.  Evaluation does not show pathology that would require ongoing emergent intervention or inpatient treatment. Pt is hemodynamically stable and mentating appropriately. Discussed findings and plan with patient/guardian, who agrees with care plan. All questions answered. Return precautions discussed and outpatient follow up given.      Monico Blitz,  PA-C 09/03/14 [place], MD 09/04/14 [phone]

## 2014-12-05 ENCOUNTER — Telehealth: Payer: Self-pay | Admitting: Nurse Practitioner

## 2014-12-05 NOTE — Telephone Encounter (Signed)
Rn returning patient's call regarding back pain. She reports worsening R middle back pain around ribs that has gotten progressively worse since last MD visit with Dr. Alycia Rossetti early August. MD told her to monitor pain at that time and report worsening symptoms. Patient is now concerned about the pain that is "constant and dull."  She also mentions that she is "extremely depressed and tearful." patient is crying while on the phone. She denies thoughts of harming her self or others. Rn offered her an appointment this week with NP or MD. Rn notified [name], NP; NP to call patient first thing in the morning with apt date/time and suggestions for pain control. Rn informed patient to go to nearest ED immediately with any change in thoughts and if she becomes a danger to her self or others. She verbalizes understanding and will await phone call in the morning. She thanks for the call.

## 2014-12-06 ENCOUNTER — Telehealth: Payer: Self-pay | Admitting: *Deleted

## 2014-12-06 ENCOUNTER — Telehealth: Payer: Self-pay | Admitting: Gynecologic Oncology

## 2014-12-06 DIAGNOSIS — M5489 Other dorsalgia: Secondary | ICD-10-CM

## 2014-12-06 MED ORDER — CYCLOBENZAPRINE HCL 5 MG PO TABS: 5.0000 mg | ORAL_TABLET | Freq: Three times a day (TID) | ORAL | Status: DC | PRN

## 2014-12-06 NOTE — Telephone Encounter (Signed)
Returned call to patient.  Patient stating she has been having a steady, dull pain on her right upper back near her rib cage for the past 2 months.  It hurts worse at different times and nothing alleviates or brings the pain on.  It is tender to touch.  Pain not increased when taking a deep breath, etc.  CT angio r/o PE was done in May due to history of shortness of breath and leg pain.  Stating she has also been having issues with sciatica.  She has also been very stressed and states she feels very emotional and depressed.  She has taken lexapro in the past and tolerated it well.  Stating she tried to arrange for a PCP appt and was told Medicaid was no longer accepted at that practice and she was treated rudely by the office staff.  She is to see PT tomorrow for lymphedema in her left leg.  She is urinating without difficulty, bowels functioning.  Denies fever, chills.  No vaginal bleeding.  Last seen by Dr. Alycia Rossetti on August 1 at [facility].  She is not sure if the pain in her back is muscular, but it is tender to touch and she is willing to try a muscle relaxant at bedtime to see if the pain subsides.  She states nothing has seemed to help with the pain such as a heating pad, tylenol, etc.  Advised that we would reach out to the Social Workers about providing supportive services and to Dr. Alycia Rossetti about further recommendations.  We will contact the patient and she is advised to call before that time if needed.

## 2014-12-06 NOTE — Telephone Encounter (Signed)
Per SW at [facility], [facility] accepts Medicaid patients. Left VM for Doris in regards to scheduling patient appt to establish care. Requested return call from [facility] confirming she received message and if she is able to accept patient.

## 2014-12-07 ENCOUNTER — Telehealth: Payer: Self-pay | Admitting: Gynecologic Oncology

## 2014-12-07 ENCOUNTER — Ambulatory Visit: Payer: Medicaid Other | Attending: Gynecologic Oncology | Admitting: Physical Therapy

## 2014-12-07 DIAGNOSIS — I890 Lymphedema, not elsewhere classified: Secondary | ICD-10-CM | POA: Diagnosis present

## 2014-12-07 NOTE — Telephone Encounter (Signed)
Left message asking the patient to please call the office with an update.  Lexapro will be sent in per Dr. Denman George for vasomotor symptoms and irritability.

## 2014-12-07 NOTE — Therapy (Addendum)
[place], [place], [address] Phone: [phone]   Fax:  [phone]  Physical Therapy Evaluation  Patient Details  Name: Karen Daniels MRN: [id number] Date of Birth: Sep 30, 1975 Referring Provider:  Nancy Marus, MD  Encounter Date: 12/07/2014      PT End of Session - 12/07/14 1036    Visit Number 1   Number of Visits 4   Date for PT Re-Evaluation 02/06/15   PT Start Time 0935   PT Stop Time 1025   PT Time Calculation (min) 50 min   Activity Tolerance Patient tolerated treatment well   Behavior During Therapy [facility] for tasks assessed/performed      Past Medical History  Diagnosis Date  . 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 Anxiety   . Depression   . Adenocarcinoma of cervix, stage 1     dx  may 2015   STAGE I B2 (+pet scan pelvic nodes for mets)---  oncologist--  dr Marko Plume and dr Sondra Come--  chemo and external radiation    . GERD (gastroesophageal reflux disease)   . Radiation 10/11/13-11/23/13    whole pelvis 45 gray    Past Surgical History  Procedure Laterality Date  . Wisdom tooth extraction    . Colposcopy    . Diagnostic laparoscopy  07-28-2000    w/ lysis adhesions  . Colonoscopy  07-11-1999  . Abdominal hysterectomy      There were no vitals filed for this visit.  Visit Diagnosis:  Lymphedema - Plan: PT plan of care cert/re-cert      Subjective Assessment - 12/07/14 0949    Subjective Edema in left arm and leg epecially after exercising  started about a month and a half ago    Pertinent History Cervical cancer diagnosed in May 2015; had seven weeks of chemo-radiation, then radical hysterectomy; then nine weeks of chemo and five more radiation for vaginal wall.  That was completed on 03/30/14.  Has started working out at home (squats, leg lifts, abdominal work) and walking. h/o left rotator cuff tear at age 33 with physical therapy and fairly full recovery.                                                          Patient Stated Goals get her swelling under control    Currently in Pain? No/denies            [facility] PT Assessment - 12/07/14 0001    Assessment   Medical Diagnosis cervical cancer   Onset Date/Surgical Date 08/13/13   Precautions   Precaution Comments cancer precautions   Restrictions   Weight Bearing Restrictions No   Balance Screen   Has the patient fallen in the past 6 months No   Has the patient had a decrease in activity level because of a fear of falling?  No   Is the patient reluctant to leave their home because of a fear of falling?  No   Home Ecologist residence   Living Arrangements Children;Other (Comment)  has 4 year-old and 45 year old children; roommate   Type of 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   Leisure exercising 5 times a week walking 2-6 a day    Cognition   Overall Cognitive Status Within Functional Limits for tasks assessed   Sensation   Light Touch Appears Intact  regular numbness in feet and hands    Coordination   Gross Motor Movements are Fluid and Coordinated Yes   Other:   Other/ Comments Lymphedema Life Impact Scale score 37 indicating 54% impairment   Posture/Postural Control   Posture/Postural Control Postural limitations   Postural Limitations Forward head   AROM   Overall AROM Comments UEs grossly WNL; LEs with mild limitations and report of stiffness at hips; others WFL, though all LE movements slow.   Strength   Overall Strength Within functional limits for tasks performed   Overall Strength Comments --   Right Hand Gross Grasp --   Right Hand Grip (lbs) --   Left Hand Gross Grasp --   Left Hand Grip (lbs) --   Ambulation/Gait    Ambulation/Gait Yes   Ambulation/Gait Assistance 6: Modified independent (Device/Increase time)   Ambulation Distance (Feet) --  denies SOB; feels endurance is improving           LYMPHEDEMA/ONCOLOGY QUESTIONNAIRE - 12/07/14 1053    What other symptoms do you have   Are you Having Heaviness or Tightness Yes   Are you having Pain No   Are you having pitting edema Yes   Body Site left arm and leg    Is it Hard or Difficult finding clothes that fit Yes  hard time finding shoes that fit   Do you have infections No   Stemmer Sign No   Lymphedema Stage   Stage STAGE 2 SPONTANEOUSLY IRREVERSIBLE  gets bettter in morning  but never  completely goes away   Right Upper Extremity Lymphedema   15 cm Proximal to Olecranon Process 30.5 cm   10 cm Proximal to Olecranon Process 27.5 cm   Olecranon Process 24.5 cm   15 cm Proximal to Ulnar Styloid Process 25 cm   10 cm Proximal to Ulnar Styloid Process 23 cm   Just Proximal to Ulnar Styloid Process 15.3 cm   Across Hand at [employer] 19.8 cm   At [place] of 2nd Digit 6 cm   Left Upper Extremity Lymphedema   15 cm Proximal to Olecranon Process 30 cm   10 cm Proximal to Olecranon Process 28 cm   Olecranon Process 24.7 cm   15 cm Proximal to Ulnar Styloid Process 25.1 cm   10 cm Proximal to Ulnar Styloid Process 23 cm   Just Proximal to Ulnar Styloid Process 15.7 cm   Across Hand at [employer] 19.5 cm   At [place] of 2nd Digit 6 cm   Right Lower Extremity Lymphedema   20 cm Proximal to Suprapatella 59 cm   10 cm Proximal to Suprapatella 50 cm   At Midpatella/Popliteal Crease 37.2 cm   30 cm Proximal to Floor at Lateral Plantar Foot 34.5 cm   20 cm Proximal to Floor at Lateral Plantar Foot 25.1 1   10  cm Proximal to Floor at Lateral Malleoli 21.4 cm   5 cm Proximal to 1st MTP Joint 23 cm   Around Proximal Great Toe 8 cm   Left Lower Extremity Lymphedema   20 cm Proximal to Suprapatella 57.5 cm   10 cm Proximal to Suprapatella 52 cm  At Midpatella/Popliteal Crease 37.9 cm   30 cm Proximal to Floor at Lateral Plantar Foot 36.1 cm   20 cm Proximal to Floor at Lateral Plantar Foot 27 cm   10 cm Proximal to Floor at Lateral Malleoli 21.4 cm   5 cm Proximal to 1st MTP Joint 23.9 cm   Around Proximal Great Toe 8.4 cm           Quick Dash - 12/07/14 0001    Open a tight or new jar Moderate difficulty   Do heavy household chores (wash walls, wash floors) Moderate difficulty   Carry a shopping bag or briefcase Moderate difficulty   Wash your back No difficulty   Use a knife to cut food Mild difficulty   Recreational activities in which you take some force or impact through your arm, shoulder, or hand (golf, hammering, tennis) Moderate difficulty   During the past week, to what extent has your arm, shoulder or hand problem interfered with your normal social activities with family, friends, neighbors, or groups? Modererately   During the past week, to what extent has your arm, shoulder or hand problem limited your work or other regular daily activities Modererately   Arm, shoulder, or hand pain. Mild   Tingling (pins and needles) in your arm, shoulder, or hand Moderate   Difficulty Sleeping No difficulty   DASH Score 36.36 %                     PT Education - 12/07/14 1036    Education provided Yes   Education Details Brief information about complete decongestive therapy including compression stockings and pumps suggested that pt try water exercise with legs in deep water to see if it has an effect on edema   Person(s) Educated Patient   Methods Explanation   Comprehension Verbalized understanding                [place] - 12/07/14 1050    CC Long Term Goal  #1   Title Patient with verbalize an understanding of lymphedema risk reduction precautions   Baseline no knowledge   Time 3   Period --  authorizaion period   Status New   CC Long Term Goal  #2   Title Patient will be  know how to obtain and use compression garments for maintenance phase of treatment   Baseline no knowledge    Time 3   Period --  authorizaion period   Status New   CC Long Term Goal  #3   Title Patient will be independent in self manual lymph drainage   Baseline no knowledge   Time 3   Period --  authorizaion period   Status New   CC Long Term Goal  #4   Title Patient will have a circumferential reduction of    1.5      cm at   10   cm above the left  suprapatellar point   Baseline 52   Time 3   Period --  authorization period   Status New            Plan - 12/07/14 1038    Clinical Impression Statement Recent onset of lymphedema in left leg that seems to be increasing with exercise and not completely resolving after elevation at night. She also reports swelling in left arm that occurs with exercise that is not measureable this morning.  Recommended trying exercise in the water to receive the  added compression from water pressure. She will benefit from instruction in manual lymph drainage, compression stockings and possible compression pump for long term management    Clinical Impairments Affecting Rehab Potential 8 lymph nodes removed at time of surgery, pelvic lymph adenopathy treated with pelvic radation    PT Frequency Other (comment)  3 per authorization period    PT Duration Other (comment)  authorizaion period    PT Treatment/Interventions ADLs/Self Care Home Management;Therapeutic exercise;Taping;Manual lymph drainage;DME Instruction;Compression bandaging;Patient/family education   PT Next Visit Plan manual lymph drainage to left leg with insturction in self technique, loan DVD, assess left arm for swelling, send face sheet to [employer] for stocking and biocompression pump   Consulted and Agree with Plan of Care Patient         Problem List Patient Active Problem List   Diagnosis Date Noted  . Symptomatic anemia 03/24/2014  . Pancytopenia 03/24/2014  .  Hyperlipidemia 03/24/2014  . Cough 03/24/2014  . Hypokalemia 03/24/2014  . Depression   . Adenocarcinoma of cervix, stage 1   . GERD (gastroesophageal reflux disease)   . Vaginal bleeding 11/09/2013  . Acute blood loss anemia 11/09/2013  . Anemia 11/09/2013  . Cervical cancer 09/07/2013  . Encounter for IUD removal 08/07/2011  . Smoker 08/07/2011  . Oral contraceptive use 08/07/2011   Donato Heinz. Owens Shark, PT  12/07/2014, 12:57 PM  [place], [place], [address] Phone: [phone]   Fax:  [phone]

## 2014-12-09 NOTE — Telephone Encounter (Signed)
Left additional VM requesting return call from patient in regards to an update.

## 2014-12-21 ENCOUNTER — Telehealth: Payer: Self-pay | Admitting: Nurse Practitioner

## 2014-12-21 NOTE — Telephone Encounter (Signed)
Left message with Tamela Oddi to request assistance with setting patient up with PCP. Requested she call our clinic at [phone] to help up continue this patient's care.

## 2014-12-26 ENCOUNTER — Ambulatory Visit: Payer: Medicaid Other | Admitting: Physical Therapy

## 2015-01-03 ENCOUNTER — Ambulatory Visit: Payer: Medicaid Other | Attending: Gynecologic Oncology

## 2015-01-03 DIAGNOSIS — R29898 Other symptoms and signs involving the musculoskeletal system: Secondary | ICD-10-CM | POA: Diagnosis present

## 2015-01-03 DIAGNOSIS — M25652 Stiffness of left hip, not elsewhere classified: Secondary | ICD-10-CM | POA: Diagnosis present

## 2015-01-03 DIAGNOSIS — M25651 Stiffness of right hip, not elsewhere classified: Secondary | ICD-10-CM | POA: Diagnosis present

## 2015-01-03 DIAGNOSIS — I890 Lymphedema, not elsewhere classified: Secondary | ICD-10-CM

## 2015-01-03 NOTE — Patient Instructions (Signed)
Deep Effective Breath   Standing, sitting, or laying down place both hands on the belly. Take a deep breath IN, expanding the belly; then breath OUT, contracting the belly. Cross hands to do light circles above collarbones 5 times. Repeat __5__ times. Do __2-3__ sessions per day and before each self massage.  http://gt2.exer.us/866   Copyright  VHI. All rights reserved.    LEG: Knee to Hip - Clear   Pump up outer thigh of involved leg from knee to outer hip. Then do stationary circles from inner to outer thigh, then do outer thigh again. Next, interlace fingers behind knee IF ABLE and make in-place circles. Do _5_ times of each sequence.  Do _1__ time per day.  Copyright  VHI. All rights reserved.  LEG: Ankle to Hip Sweep   Hands on sides of ankle of involved leg, pump _5__ times up both sides of lower leg, then retrace steps up outer thigh to hip as before and back to pathway. Do _2-3_ times. Do __1_ time per day.  Copyright  VHI. All rights reserved.  FOOT: Dorsum of Foot and Toes Massage   One hand on top of foot make _5_ stationary circles or pumps, then either on top of toes or each individual toe do _5_ pumps. Then retrace all steps pumping back up both sides of lower leg, outer thigh, and then pathway. Finish with what you started with, _5_ circles at involved side arm pit. All _2-3_ times at each sequence. Do _1__ time per day.  Copyright  VHI. All rights reserved.

## 2015-01-03 NOTE — Therapy (Signed)
[place], [place], [address] Phone: [phone]   Fax:  [phone]  Physical Therapy Treatment  Patient Details  Name: PARNIKA TWETEN MRN: [id number] Date of Birth: 05-25-1975 Referring Provider:  Nancy Marus, MD  Encounter Date: 01/03/2015      PT End of Session - 01/03/15 0851    Visit Number 2   Number of Visits 4   Date for PT Re-Evaluation 02/06/15   PT Start Time 0802   PT Stop Time 0849   PT Time Calculation (min) 47 min   Activity Tolerance Patient tolerated treatment well   Behavior During Therapy [facility] for tasks assessed/performed      Past Medical History  Diagnosis Date  . 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 Anxiety   . Depression   . Adenocarcinoma of cervix, stage 1     dx  may 2015   STAGE I B2 (+pet scan pelvic nodes for mets)---  oncologist--  dr Marko Plume and dr Sondra Come--  chemo and external radiation    . GERD (gastroesophageal reflux disease)   . Radiation 10/11/13-11/23/13    whole pelvis 45 gray    Past Surgical History  Procedure Laterality Date  . Wisdom tooth extraction    . Colposcopy    . Diagnostic laparoscopy  07-28-2000    w/ lysis adhesions  . Colonoscopy  07-11-1999  . Abdominal hysterectomy      There were no vitals filed for this visit.  Visit Diagnosis:  Lymphedema  Stiffness of hip joint, right  Stiffness of hip joint, left  Weakness of both lower extremities      Subjective Assessment - 01/03/15 0808    Subjective I'm just really frustrated, the swelling won't be there at all one day and then flared up the next. I'm going to have to go see a doctor about my neuropathy bc it's getting worse, especially in my hands, I'm having trouble writing now.    Currently in Pain? No/denies   Pain Score 7    Pain Location Back   Pain Orientation Right   Pain Descriptors / Indicators Dull   Pain Type Chronic pain   Pain Relieving Factors  nothing                         [facility] Adult PT Treatment/Exercise - 01/03/15 0001    Manual Therapy   Manual Lymphatic Drainage (MLD) In Supine: 5 diaphragmatic breaths, short neck, Rt inguinal nodes, anterior inter-axillary anastomosis and then Lt LE from dorsal foot to lateral thigh instructing pt throughout.                PT Education - 01/03/15 0850    Education provided Yes   Education Details Self manual lymph drainage   Person(s) Educated Patient   Methods Explanation;Demonstration;Handout  Loaned DVD as well   Comprehension Verbalized understanding;Returned demonstration;Need further instruction                [facility] Goals - 01/03/15 1039    CC Long Term Goal  #1   Title Patient with verbalize an understanding of lymphedema risk reduction precautions  Began instructing pt in this today. 01/03/15   Status On-going   CC Long Term Goal  #2   Title Patient will be know how to obtain and use compression garments for maintenance phase of treatment  Faxed pts info to fitter today so process has  been started. Fitter will contact pt in the next few days.    Status On-going   CC Long Term Goal  #3   Title Patient will be independent in self manual lymph drainage  Pt instructed in this today with handouts issued. 01/03/15   Status Partially Met   CC Long Term Goal  #4   Title Patient will have a circumferential reduction of    1.5      cm at   10   cm above the left  suprapatellar point   Status On-going            Plan - 01/03/15 4193    Clinical Impression Statement Pt did very well with initial instruction of lymphedema and principles of manual lymph drainage. She retruned some demo but not all today. She is to watch the DVD and practice the massage before coming back for next visit next week due to having Medicaid and limitied visits.    Pt will benefit from skilled therapeutic intervention in order to improve on the following  deficits Impaired flexibility;Decreased strength;Pain   Rehab Potential Good   Clinical Impairments Affecting Rehab Potential 8 lymph nodes removed at time of surgery, pelvic lymph adenopathy treated with pelvic radation    PT Frequency Other (comment)  3 per authorization   PT Duration Other (comment)  authorization period   PT Treatment/Interventions ADLs/Self Care Home Management;Therapeutic exercise;Taping;Manual lymph drainage;DME Instruction;Compression bandaging;Patient/family education   PT Next Visit Plan cont manual lymph drainage to left leg with instruction in self technique, cont to assess left arm for swelling, send face sheet to [employer] for stocking and biocompression pump   Recommended Other Services Sent pts info to [employer] today for compression garments.   Consulted and Agree with Plan of Care Patient        Problem List Patient Active Problem List   Diagnosis Date Noted  . Symptomatic anemia 03/24/2014  . Pancytopenia 03/24/2014  . Hyperlipidemia 03/24/2014  . Cough 03/24/2014  . Hypokalemia 03/24/2014  . Depression   . Adenocarcinoma of cervix, stage 1   . GERD (gastroesophageal reflux disease)   . Vaginal bleeding 11/09/2013  . Acute blood loss anemia 11/09/2013  . Anemia 11/09/2013  . Cervical cancer 09/07/2013  . Encounter for IUD removal 08/07/2011  . Smoker 08/07/2011  . Oral contraceptive use 08/07/2011    Otelia Limes, PTA 01/03/2015, 10:56 AM  [place], [place], [address] Phone: [phone]   Fax:  [phone]

## 2015-01-09 ENCOUNTER — Ambulatory Visit: Payer: Medicaid Other | Admitting: Physical Therapy

## 2015-01-09 DIAGNOSIS — I890 Lymphedema, not elsewhere classified: Secondary | ICD-10-CM | POA: Diagnosis not present

## 2015-01-09 NOTE — Therapy (Signed)
[place], [place], [address] Phone: [phone]   Fax:  [phone]  Physical Therapy Treatment  Patient Details  Name: Karen Daniels MRN: [id number] Date of Birth: [birth year] Referring Provider:  Nancy Marus, MD  Encounter Date: 01/09/2015      PT End of Session - 01/09/15 1711    Visit Number 3   Number of Visits 4   Date for PT Re-Evaluation 02/06/15   PT Start Time 6962   PT Stop Time 1605   PT Time Calculation (min) 49 min   Activity Tolerance Patient tolerated treatment well   Behavior During Therapy [facility] for tasks assessed/performed      Past Medical History  Diagnosis Date  . 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 Anxiety   . Depression   . Adenocarcinoma of cervix, stage 1     dx  may 2015   STAGE I B2 (+pet scan pelvic nodes for mets)---  oncologist--  dr Marko Plume and dr Sondra Come--  chemo and external radiation    . GERD (gastroesophageal reflux disease)   . Radiation 10/11/13-11/23/13    whole pelvis 45 gray    Past Surgical History  Procedure Laterality Date  . Wisdom tooth extraction    . Colposcopy    . Diagnostic laparoscopy  07-28-2000    w/ lysis adhesions  . Colonoscopy  07-11-1999  . Abdominal hysterectomy      There were no vitals filed for this visit.  Visit Diagnosis:  Lymphedema      Subjective Assessment - 01/09/15 1521    Subjective Didn't get a change to practice the manual lymph drainage.  This weekend it seemed like the right arm was swollen more than the left.   Currently in Pain? Yes   Pain Score 4    Pain Location Back  + hand and foot neuropathy   Pain Orientation Lower   Pain Descriptors / Indicators Constant   Aggravating Factors  nothing   Pain Relieving Factors nothing                         [facility] Adult PT Treatment/Exercise - 01/09/15 0001    Self-Care   Self-Care Other Self-Care Comments   Other Self-Care  Comments  instructed in self-manual lymph drainage as listed below; also instructed in lymphedema risk reduction practices   Manual Therapy   Manual Lymphatic Drainage (MLD) Had patient perform the following in reclined position with legs elevated on bolster:  diaphragmatic breathing x 5, short neck, right groin and inter-inguinal anastomosis, and left LE from dorsal foot to lateral thigh.  Patient performed this with instruction of therapist.                PT Education - 01/09/15 1711    Education provided Yes   Education Details self manual lymph drainage; lymphedema risk reduction practices   Person(s) Educated Patient   Methods Explanation;Verbal cues;Handout  handout for lymphedema risk reduction today   Comprehension Verbalized understanding;Returned demonstration                [facility] Goals - 01/09/15 1714    CC Long Term Goal  #1   Title Patient with verbalize an understanding of lymphedema risk reduction precautions   Status Achieved   CC Long Term Goal  #2   Title Patient will be know how to obtain and use compression garments for maintenance phase of  treatment   Status On-going   CC Long Term Goal  #3   Title Patient will be independent in self manual lymph drainage   Status Partially Met   CC Long Term Goal  #4   Title Patient will have a circumferential reduction of    1.5      cm at   10   cm above the left  suprapatellar point   Status On-going            Plan - 01/09/15 1712    Clinical Impression Statement Patient did well performing self-manual lymph drainage with instruction today; also was attentive for information about lymphedema risk reduction.  Says she has been measured for garments already.   Pt will benefit from skilled therapeutic intervention in order to improve on the following deficits Increased edema;Decreased knowledge of precautions;Decreased knowledge of use of DME   Rehab Potential Good   PT Frequency --  3  treatment visits   PT Treatment/Interventions ADLs/Self Care Home Management;Manual lymph drainage;Patient/family education   PT Next Visit Plan review manual lymph drainage prn; discuss exercise program and safe progression; check goals   Consulted and Agree with Plan of Care Patient        Problem List Patient Active Problem List   Diagnosis Date Noted  . Symptomatic anemia 03/24/2014  . Pancytopenia 03/24/2014  . Hyperlipidemia 03/24/2014  . Cough 03/24/2014  . Hypokalemia 03/24/2014  . Depression   . Adenocarcinoma of cervix, stage 1   . GERD (gastroesophageal reflux disease)   . Vaginal bleeding 11/09/2013  . Acute blood loss anemia 11/09/2013  . Anemia 11/09/2013  . Cervical cancer 09/07/2013  . Encounter for IUD removal 08/07/2011  . Smoker 08/07/2011  . Oral contraceptive use 08/07/2011    SALISBURY,DONNA 01/09/2015, 5:16 PM  [place], [place], [address] Phone: [phone]   Fax:  [place], PT 01/09/2015 5:16 PM

## 2015-01-18 ENCOUNTER — Ambulatory Visit (INDEPENDENT_AMBULATORY_CARE_PROVIDER_SITE_OTHER): Payer: Medicaid Other | Admitting: Internal Medicine

## 2015-01-18 ENCOUNTER — Encounter: Payer: Self-pay | Admitting: Internal Medicine

## 2015-01-18 VITALS — BP 103/65 | HR 78 | Temp 97.8°F | Ht 65.0 in | Wt 165.2 lb

## 2015-01-18 DIAGNOSIS — G620 Drug-induced polyneuropathy: Secondary | ICD-10-CM | POA: Diagnosis not present

## 2015-01-18 DIAGNOSIS — C539 Malignant neoplasm of cervix uteri, unspecified: Secondary | ICD-10-CM

## 2015-01-18 DIAGNOSIS — T451X5A Adverse effect of antineoplastic and immunosuppressive drugs, initial encounter: Secondary | ICD-10-CM | POA: Diagnosis not present

## 2015-01-18 MED ORDER — PREGABALIN 50 MG PO CAPS: 50.0000 mg | ORAL_CAPSULE | Freq: Three times a day (TID) | ORAL | Status: DC

## 2015-01-18 NOTE — Progress Notes (Signed)
   Subjective:    Patient ID: Karen Daniels, female    DOB: 04-16-1975, 39 y.o.   MRN: [id number]  HPI Karen Daniels is a 39 year old female with GERD, hyperlipidemia, cervical cancer stage I, who presents today for establishing PCP care with our clinic. Please see assessment & plan for documentation of chronic medical problems.    Review of Systems  Constitutional: Negative for appetite change and unexpected weight change.  Respiratory: Negative for shortness of breath.   Cardiovascular: Negative for chest pain.  Gastrointestinal: Negative for nausea, vomiting, abdominal pain and diarrhea.  Genitourinary: Negative for vaginal bleeding and menstrual problem.  Musculoskeletal: Positive for back pain.  Neurological: Positive for numbness. Negative for dizziness and headaches.       Objective:   Physical Exam Constitutional: Karen Daniels Caucasian female. No distress.  Head: Normocephalic and atraumatic.  Eyes: Conjunctivae are normal. Pupils are 3 mm, direct, consensual, near.  Cardiovascular: Normal rate, regular rhythm and normal heart sounds.  No gallop, friction rub, murmur heard. Pulmonary/Chest: Effort normal. No respiratory distress. No wheezes, rales.  Abdominal: Soft. Bowel sounds are normal. No distension. No tenderness.  Neurological: Alert and oriented to person, place, and time. CN II-XII intact, 5/5 upper and lower extremity strength, 2+ grip strength, 2+ brachial/patellar reflexes, finger to nose & rapid alternating movements intact. Coordination normal.  Extremities: Tenderness to palpation overlying mid-right side on back. Skin: Warm and dry. Not diaphoretic. No rash/lesions noted overlying right shoulder or arm. Psychiatric: Affect bright.         Assessment & Plan:

## 2015-01-18 NOTE — Patient Instructions (Signed)
Thank you for bringing your medicines today. This helps [place] keep you safe from mistakes.  Please take Lyrica 50mg  three times daily and follow-up next week.

## 2015-01-19 ENCOUNTER — Encounter: Payer: Self-pay | Admitting: Internal Medicine

## 2015-01-20 NOTE — Assessment & Plan Note (Addendum)
For the last two months, she describes right-sided back pain that is associated with numbness and tingling down her right arm. She also reports a burning sensation overlying her right arm though without rashes or lesions. She denies any changes to her weight, appetite, energy since finishing chemotherapy back in December.  As she did undergo chemotherapy with cisplatin and paclitaxel in the past [see overview under cervical cancer], these are the likely responsible for her symptoms. CBC in April 2016 at [facility] was notable for normocytic anemia though MCV was 96 making B12/folate deficiency less likely. She does not describe any bony pain for [place] to consider myeloma workup at the moment.  She is agreeable to a trial of Lyrica 50mg  TID with 1-week follow-up to assess symptoms.

## 2015-01-20 NOTE — Assessment & Plan Note (Signed)
History was collected from chart review and patient. She is due to follow-up with her radiation oncologist in early November.

## 2015-01-23 ENCOUNTER — Ambulatory Visit: Payer: Medicaid Other | Attending: Gynecologic Oncology | Admitting: Physical Therapy

## 2015-01-23 ENCOUNTER — Telehealth: Payer: Self-pay | Admitting: General Practice

## 2015-01-23 DIAGNOSIS — I890 Lymphedema, not elsewhere classified: Secondary | ICD-10-CM | POA: Insufficient documentation

## 2015-01-23 NOTE — Telephone Encounter (Signed)
We have AP request, Ulis Rias will start tomorrow. Pt informed

## 2015-01-23 NOTE — Therapy (Addendum)
[place], [place], [address] Phone: [phone]   Fax:  [phone]  Physical Therapy Treatment  Patient Details  Name: Karen Daniels MRN: [id number] Date of Birth: April 17, 1975 Referring Provider:  Nancy Marus, MD  Encounter Date: 01/23/2015      PT End of Session - 01/23/15 1437    Visit Number 4   Number of Visits 4   Date for PT Re-Evaluation 02/06/15   PT Start Time 3220   PT Stop Time 1430   PT Time Calculation (min) 43 min   Activity Tolerance Patient tolerated treatment well   Behavior During Therapy [facility] for tasks assessed/performed      Past Medical History  Diagnosis Date  . 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 Anxiety   . Depression   . Adenocarcinoma of cervix, stage 1 ([place])     dx  may 2015   STAGE I B2 (+pet scan pelvic nodes for mets)---  oncologist--  dr Marko Plume and dr Sondra Come--  chemo and external radiation    . GERD (gastroesophageal reflux disease)   . Radiation 10/11/13-11/23/13    whole pelvis 45 gray    Past Surgical History  Procedure Laterality Date  . Wisdom tooth extraction    . Colposcopy    . Diagnostic laparoscopy  07-28-2000    w/ lysis adhesions  . Colonoscopy  07-11-1999  . Abdominal hysterectomy  12/24/13    There were no vitals filed for this visit.  Visit Diagnosis:  Lymphedema      Subjective Assessment - 01/23/15 1355    Currently in Pain? Yes   Pain Score 2    Pain Location Back   Aggravating Factors  nothing   Pain Relieving Factors nothing                         [facility] Adult PT Treatment/Exercise - 01/23/15 0001    Manual Therapy   Manual Lymphatic Drainage (MLD) Instructed patient's boyfriend in performing the following in supine position with legs elevated on bolster:  diaphragmatic breathing x 5, short neck, right groin and inter-inguinal anastomosis, and left LE from dorsal foot to lateral thigh.                  PT Education - 01/23/15 1437    Education provided Yes   Education Details manual lymph drainage for left LE   Person(s) Educated Other (comment)  signficant other   Methods Explanation;Demonstration;Tactile cues;Verbal cues   Comprehension Verbalized understanding;Returned demonstration                [facility] Goals - 01/23/15 1423    CC Long Term Goal  #1   Title Patient with verbalize an understanding of lymphedema risk reduction precautions   Status Achieved   CC Long Term Goal  #2   Title Patient will be know how to obtain and use compression garments for maintenance phase of treatment   Status Achieved   CC Long Term Goal  #3   Title Patient will be independent in self manual lymph drainage   Status Achieved   CC Long Term Goal  #4   Title Patient will have a circumferential reduction of    1.5      cm at   10   cm above the left  suprapatellar point   Baseline 50.8 on 01/23/15 compared to 52 on eval   Status  Partially Met            Plan - 01/23/15 1438    Clinical Impression Statement [name] did well with instruction performing manual lymph drainage to left LE.  Patient knows that she may call with questions; otherwise, ready for discharge.  Goals mostly met except edema reduction goal, which was to reduce by 1.5 cm. and patient reduced by 1.2 cm.   She is to receive her compression stockings on 01/25/15.                                                         Pt will benefit from skilled therapeutic intervention in order to improve on the following deficits Increased edema;Decreased knowledge of precautions;Decreased knowledge of use of DME   Rehab Potential Good   Clinical Impairments Affecting Rehab Potential 8 lymph nodes removed at time of surgery, pelvic lymph adenopathy treated with pelvic radation    PT Frequency --  3 treatment visits   PT Treatment/Interventions Manual lymph drainage;Patient/family education    PT Next Visit Plan None; discharge today.   PT Home Exercise Plan stretching and strengthening   Consulted and Agree with Plan of Care Patient        Problem List Patient Active Problem List   Diagnosis Date Noted  . Chemotherapy-induced peripheral neuropathy ([place]) 01/18/2015  . Symptomatic anemia 03/24/2014  . GERD (gastroesophageal reflux disease)   . Cervical cancer ([place]) 09/07/2013  . Former smoker 08/07/2011  . Oral contraceptive use 08/07/2011    SALISBURY,DONNA 01/23/2015, 2:43 PM  [facility], [place], [address] Phone: [phone]   Fax:  [phone]  PHYSICAL THERAPY DISCHARGE SUMMARY  Visits from Start of Care: 4  Current functional level related to goals / functional outcomes: 3 of 4 goals met and the fourth partially met as noted above.   Remaining deficits: Swelling will continue to require management; patient hasn't gotten her compression stockings yet, but expects to on 01/25/15.   Education / Equipment: Self-manual lymph drainage, lymphedema risk reduction, use of compression garments. Plan: Patient agrees to discharge.  Patient goals were not met. Patient is being discharged due to meeting the stated rehab goals.  ?????       Serafina Royals, PT 01/23/2015 2:43 PM

## 2015-01-23 NOTE — Telephone Encounter (Signed)
Pt called requesting the nurse to call back regarding Lyrica. Pt  States medicaid will not pay for Lyrica unless the doctor approve the Rx.

## 2015-01-24 NOTE — Progress Notes (Signed)
Medicine attending: Medical history, presenting problems, physical findings, and medications, reviewed with Dr Rushil Patel on the day of the patient visit  and I concur with his evaluation and management plan. 

## 2015-01-25 ENCOUNTER — Ambulatory Visit: Payer: Medicaid Other | Admitting: Internal Medicine

## 2015-01-30 ENCOUNTER — Emergency Department (HOSPITAL_BASED_OUTPATIENT_CLINIC_OR_DEPARTMENT_OTHER)
Admission: EM | Admit: 2015-01-30 | Discharge: 2015-01-31 | Disposition: A | Discharge status: Home / Self Care | Payer: Medicaid Other | Attending: Emergency Medicine | Admitting: Emergency Medicine

## 2015-01-30 ENCOUNTER — Encounter (HOSPITAL_BASED_OUTPATIENT_CLINIC_OR_DEPARTMENT_OTHER): Payer: Self-pay | Admitting: Emergency Medicine

## 2015-01-30 ENCOUNTER — Other Ambulatory Visit: Payer: Self-pay | Admitting: General Practice

## 2015-01-30 DIAGNOSIS — G8929 Other chronic pain: Secondary | ICD-10-CM | POA: Insufficient documentation

## 2015-01-30 DIAGNOSIS — Z8619 Personal history of other infectious and parasitic diseases: Secondary | ICD-10-CM | POA: Diagnosis not present

## 2015-01-30 DIAGNOSIS — R1032 Left lower quadrant pain: Secondary | ICD-10-CM | POA: Insufficient documentation

## 2015-01-30 DIAGNOSIS — M5416 Radiculopathy, lumbar region: Secondary | ICD-10-CM | POA: Diagnosis not present

## 2015-01-30 DIAGNOSIS — Z87891 Personal history of nicotine dependence: Secondary | ICD-10-CM | POA: Insufficient documentation

## 2015-01-30 DIAGNOSIS — Z8541 Personal history of malignant neoplasm of cervix uteri: Secondary | ICD-10-CM | POA: Diagnosis not present

## 2015-01-30 DIAGNOSIS — Z8659 Personal history of other mental and behavioral disorders: Secondary | ICD-10-CM | POA: Diagnosis not present

## 2015-01-30 DIAGNOSIS — Z8719 Personal history of other diseases of the digestive system: Secondary | ICD-10-CM | POA: Diagnosis not present

## 2015-01-30 DIAGNOSIS — Z79899 Other long term (current) drug therapy: Secondary | ICD-10-CM | POA: Diagnosis not present

## 2015-01-30 DIAGNOSIS — M79605 Pain in left leg: Secondary | ICD-10-CM | POA: Diagnosis present

## 2015-01-30 MED ORDER — OXYCODONE-ACETAMINOPHEN 5-325 MG PO TABS
1.0000 | ORAL_TABLET | Freq: Once | ORAL | Status: AC
  Administered 2015-01-30: 1 via ORAL
  Filled 2015-01-30: qty 1

## 2015-01-30 NOTE — ED Notes (Signed)
Patient states that she is having pain in her inner thigh with pain radiating down into her left foot. The patient reports that all other symptoms that she is having are not new.

## 2015-01-30 NOTE — Telephone Encounter (Signed)
Please call pt back.

## 2015-01-31 MED ORDER — PREDNISONE 50 MG PO TABS: 50.0000 mg | ORAL_TABLET | Freq: Every day | ORAL | Status: DC

## 2015-01-31 MED ORDER — HYDROCODONE-ACETAMINOPHEN 5-325 MG PO TABS: 1.0000 | ORAL_TABLET | Freq: Four times a day (QID) | ORAL | Status: DC | PRN

## 2015-01-31 NOTE — Discharge Instructions (Signed)
Return here as needed.  Use ice and heat on the area that is sore.  Follow-up with your primary care doctor

## 2015-01-31 NOTE — ED Provider Notes (Signed)
CSN: [id number]     Arrival date & time 01/30/15  2227 History   First MD Initiated Contact with Patient 01/30/15 2324     Chief Complaint  Patient presents with  . Leg Pain     (Consider location/radiation/quality/duration/timing/severity/associated sxs/prior Treatment) HPI Patient presents to the emergency department with pain in her groin that extends to her inner thigh.  The patient states that seems to radiate down to her foot.  She does have chronic back problems as well.  The patient states that nothing seems make her condition better or worse.  She states that she does not have any chest pain, shortness of breath, weakness, dizziness, headache, blurred vision, fever, dysuria, numbness, rash, or syncope.  The patient states that she did not take any medications prior to arrival Past Medical History  Diagnosis Date  . 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 Anxiety   . Depression   . Adenocarcinoma of cervix, stage 1 ([place])     dx  may 2015   STAGE I B2 (+pet scan pelvic nodes for mets)---  oncologist--  dr Marko Plume and dr Sondra Come--  chemo and external radiation    . GERD (gastroesophageal reflux disease)   . Radiation 10/11/13-11/23/13    whole pelvis 45 gray   Past Surgical History  Procedure Laterality Date  . Wisdom tooth extraction    . Colposcopy    . Diagnostic laparoscopy  07-28-2000    w/ lysis adhesions  . Colonoscopy  07-11-1999  . Abdominal hysterectomy  12/24/13   Family History  Problem Relation Age of Onset  . Heart disease Father   . COPD Father   . Diabetes Father   . Hypertension Father   . Arthritis Father   . Arthritis Mother   . Breast cancer Maternal Aunt   . Diabetes Maternal Grandmother   . Cancer Maternal Grandmother     skin cancer  . Heart disease Maternal Grandmother   . Breast cancer Paternal Grandmother   . Cancer Maternal Aunt     lung   Social History  Substance Use Topics  . Smoking status: Former Smoker -- 0.50 packs/day  for 2 years    Types: Cigarettes    Quit date: 08/30/2013  . Smokeless tobacco: Never Used     Comment: Quit in May 2015  . Alcohol Use: 0.0 oz/week    0 Standard drinks or equivalent per week     Comment: occasionally   OB History    Gravida Para Term Preterm AB TAB SAB Ectopic Multiple Living   4 2 2  2 1 1   2      Review of Systems All other systems negative except as documented in the HPI. All pertinent positives and negatives as reviewed in the HPI.   Allergies  Review of patient's allergies indicates no known allergies.  Home Medications   Prior to Admission medications   Medication Sig Start Date End Date Taking? Authorizing Provider  acyclovir (ZOVIRAX) 400 MG tablet Take 400 mg by mouth 2 (two) times daily.     Historical Provider, MD  pregabalin (LYRICA) 50 MG capsule Take 1 capsule (50 mg total) by mouth 3 (three) times daily. Patient not taking: Reported on 01/23/2015 01/18/15   Rushil Sherrye Payor, MD   BP 127/75 mmHg  Pulse 72  Temp(Src) 97.9 F (36.6 C) (Oral)  Resp 16  Ht 5\' 5"  (1.651 m)  Wt 165 lb (74.844 kg)  BMI 27.46 kg/m2  SpO2 100%  LMP 11/16/2013 Physical Exam  Constitutional: She is oriented to person, place, and time. She appears well-developed and well-nourished. No distress.  HENT:  Head: Normocephalic and atraumatic.  Mouth/Throat: Oropharynx is clear and moist.  Eyes: Pupils are equal, round, and reactive to light.  Neck: Normal range of motion. Neck supple.  Cardiovascular: Normal rate, regular rhythm and normal heart sounds.  Exam reveals no gallop and no friction rub.   No murmur heard. Pulmonary/Chest: Effort normal and breath sounds normal. No respiratory distress. She has no wheezes.  Abdominal: Soft. Bowel sounds are normal. She exhibits no distension. There is no tenderness.  Musculoskeletal:       Left hip: She exhibits tenderness. She exhibits normal range of motion, normal strength, no bony tenderness, no swelling, no crepitus and  no deformity.  Neurological: She is alert and oriented to person, place, and time. She exhibits normal muscle tone. Coordination normal.  Skin: Skin is warm and dry. No rash noted. No erythema.  Psychiatric: She has a normal mood and affect. Her behavior is normal.  Nursing note and vitals reviewed.   ED Course  Procedures (including critical care time)   The patient will be treated for this, radiating pain into her leg.  This could be related to her back.  She has had problems with her back and radiating pain in the past.  Patient does not have any swelling in her lower extremities.  No signs of DVT.  She does not have any risk factors for DVT  Dalia Heading, PA-C 01/31/15 0112  Shanon Rosser, MD 01/31/15 [phone]

## 2015-02-01 ENCOUNTER — Encounter: Payer: Self-pay | Admitting: Internal Medicine

## 2015-02-01 ENCOUNTER — Ambulatory Visit (HOSPITAL_COMMUNITY)
Admission: RE | Admit: 2015-02-01 | Discharge: 2015-02-01 | Disposition: A | Discharge status: Home / Self Care | Payer: Medicaid Other | Source: Ambulatory Visit | Attending: Internal Medicine | Admitting: Internal Medicine

## 2015-02-01 ENCOUNTER — Ambulatory Visit (INDEPENDENT_AMBULATORY_CARE_PROVIDER_SITE_OTHER): Payer: Medicaid Other | Admitting: Internal Medicine

## 2015-02-01 VITALS — BP 116/65 | HR 80 | Temp 98.1°F | Wt 167.1 lb

## 2015-02-01 DIAGNOSIS — R1030 Lower abdominal pain, unspecified: Secondary | ICD-10-CM | POA: Diagnosis not present

## 2015-02-01 DIAGNOSIS — Z923 Personal history of irradiation: Secondary | ICD-10-CM | POA: Diagnosis not present

## 2015-02-01 DIAGNOSIS — T451X5A Adverse effect of antineoplastic and immunosuppressive drugs, initial encounter: Principal | ICD-10-CM

## 2015-02-01 DIAGNOSIS — Z8541 Personal history of malignant neoplasm of cervix uteri: Secondary | ICD-10-CM

## 2015-02-01 DIAGNOSIS — T451X5D Adverse effect of antineoplastic and immunosuppressive drugs, subsequent encounter: Secondary | ICD-10-CM

## 2015-02-01 DIAGNOSIS — M62830 Muscle spasm of back: Secondary | ICD-10-CM | POA: Diagnosis not present

## 2015-02-01 DIAGNOSIS — G620 Drug-induced polyneuropathy: Secondary | ICD-10-CM

## 2015-02-01 DIAGNOSIS — Z9221 Personal history of antineoplastic chemotherapy: Secondary | ICD-10-CM | POA: Diagnosis not present

## 2015-02-01 MED ORDER — CYCLOBENZAPRINE HCL 10 MG PO TABS: 10.0000 mg | ORAL_TABLET | Freq: Every day | ORAL | Status: DC

## 2015-02-01 MED ORDER — GABAPENTIN 300 MG PO CAPS: 300.0000 mg | ORAL_CAPSULE | Freq: Three times a day (TID) | ORAL | Status: DC

## 2015-02-01 NOTE — Telephone Encounter (Signed)
Nurse has reviewed the chart and spoken w/ PA nurse, we will resolve this today and await decision of insurance to pay for med, after PA is started we will notify pt and schedule a f/u appt for 1 week after starting med as requested by dr patel

## 2015-02-01 NOTE — Patient Instructions (Addendum)
Karen Daniels it was nice meeting you today.  -Start taking Gabapentin 300 mg: 3 times a day. You may start with a lower dose initially as this medication may make you drowsy.   -DO NOT take Lyrica  -Start taking Flexeril 10 mg: 1 tablet at bedtime as needed for back muscle spasms.  -I have ordered an xray and will call you when the results come back.   -Return for a follow-up visit in 1 month.

## 2015-02-01 NOTE — Telephone Encounter (Signed)
Per Ulis Rias, med approved but pt was concerned about med, appt given for this pm

## 2015-02-01 NOTE — Telephone Encounter (Signed)
PA request was submitted online via [employer]-medication approved through 01/2016.  Pt aware, but would like to keep appt for this afternoon to discuss restarting gabapentin prior to starting the lyrica.  Phone call complete.[name] [name]10/19/201611:54 AM          [phone]  [phone] [facility] [phone] Karen Daniels 02/01/2015  APPROVED 02/01/2015 - 02/01/2016 [facility]

## 2015-02-01 NOTE — Telephone Encounter (Signed)
Patient called upset stating this is the 3rd week she has called concerning Her Lyrica medication refill and no one as addressed her refill.  Patient states she is tried of waiting and someone needs to help her because she does not understand why it has taken so long to get her meds.Marland Kitchen

## 2015-02-03 DIAGNOSIS — M62830 Muscle spasm of back: Secondary | ICD-10-CM | POA: Insufficient documentation

## 2015-02-03 NOTE — Assessment & Plan Note (Signed)
Patient has a history of cervical cancer and underwent chemotherapy with Cisplatin and Paclitaxel. In addition, she was treated with radiation to the entire pelvis. She is presenting with a 3 day history of sharp pain radiating from her groin to her L leg (thigh and lower leg). Pelvic x-ray ordered at this visit normal, making pubic symphysitis less likely. Patient is also complaining of a 2 month history of burning, numbness, and tingling in her hands and feet. Lastly, she reports having pain on the right side of her back (thoracolumbar region) for the past 2 months. Describes the pain as a dull, constant, burning sensation that radiates down to her right hip and leg. Denies any alarm symptoms such as urinary or fecal incontinence. Physical exam revealed normal strength, however, sensation was reduced in bilateral hands and feet. Straight leg raise test negative bilaterally. She did exhibit paraspinal muscle tenderness in the thoracic and lumbar region. Patient's back pain is likely secondary to paraspinal muscle spasms. However, the burning sensation in the back could be due to neuropathy. During her previous visit, she was prescribed Lyrica but patient reports not taking this medication because it is a controlled substance.  -Gabapentin 300 mg TID -Flexeril 10 mg qhs prn back muscles spasms -F/u visit in 1 month.

## 2015-02-03 NOTE — Assessment & Plan Note (Signed)
Paraspinal muscles feel tight in the thoracic and lumbar region. This could account for the patient's back pain.  -Flexeril 10 mg qhs prn muscle spasms

## 2015-02-03 NOTE — Progress Notes (Signed)
Patient ID: URSULA DERMODY, female   DOB: 1975/11/19, 39 y.o.   MRN: [id number]   Subjective:   Patient ID: EMALINA DUBREUIL female   DOB: 21-Jun-1975 39 y.o.   MRN: [id number]  HPI: Ms.Medha L Seigler is a 39 y.o. F with a PMHx of adenocarcinoma of cervix s/p radiation to entire pelvis presenting to the clinic with a 3 day history of sharp pain radiating from her groin to her L leg (thigh and lower leg). Patient is also complaining of a 2 month history of burning, numbness, and tingling in her hands and feet. Lastly, patient reports having pain on the right side of her back (thoracolumbar region) for the past 2 months. She describes the pain as a dull, constant, burning sensation that radiates down her right hip and leg. Denies any urinary or fecal incontinence.     Past Medical History  Diagnosis Date  . 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 Anxiety   . Depression   . Adenocarcinoma of cervix, stage 1 ([place])     dx  may 2015   STAGE I B2 (+pet scan pelvic nodes for mets)---  oncologist--  dr Marko Plume and dr Sondra Come--  chemo and external radiation    . GERD (gastroesophageal reflux disease)   . Radiation 10/11/13-11/23/13    whole pelvis 45 gray   Current Outpatient Prescriptions  Medication Sig Dispense Refill  . acyclovir (ZOVIRAX) 400 MG tablet Take 400 mg by mouth 2 (two) times daily.     . cyclobenzaprine (FLEXERIL) 10 MG tablet Take 1 tablet (10 mg total) by mouth at bedtime. As needed for back muscle spasms. 30 tablet 0  . gabapentin (NEURONTIN) 300 MG capsule Take 1 capsule (300 mg total) by mouth 3 (three) times daily. 90 capsule 2  . HYDROcodone-acetaminophen (NORCO/VICODIN) 5-325 MG tablet Take 1 tablet by mouth every 6 (six) hours as needed for moderate pain. 15 tablet 0  . predniSONE (DELTASONE) 50 MG tablet Take 1 tablet (50 mg total) by mouth daily with breakfast. 5 tablet 0  . [DISCONTINUED] norethindrone (MICRONOR,CAMILA,ERRIN) 0.35 MG tablet Take 1 tablet (0.35 mg  total) by mouth daily. 1 Package 11   No current facility-administered medications for this visit.   Family History  Problem Relation Age of Onset  . Heart disease Father   . COPD Father   . Diabetes Father   . Hypertension Father   . Arthritis Father   . Arthritis Mother   . Breast cancer Maternal Aunt   . Diabetes Maternal Grandmother   . Cancer Maternal Grandmother     skin cancer  . Heart disease Maternal Grandmother   . Breast cancer Paternal Grandmother   . Cancer Maternal Aunt     lung   Social History   Social History  . Marital Status: Divorced    Spouse Name: N/A  . Number of Children: 2  . Years of Education: N/A   Occupational History  .     Social History Main Topics  . Smoking status: Former Smoker -- 0.50 packs/day for 2 years    Types: Cigarettes    Quit date: 08/30/2013  . Smokeless tobacco: Never Used     Comment: Quit in May 2015  . Alcohol Use: 0.0 oz/week    0 Standard drinks or equivalent per week     Comment: occasionally  . Drug Use: No  . Sexual Activity: Yes   Other Topics Concern  . None   Social History  Narrative   Review of Systems: Review of Systems  Constitutional: Negative for fever and chills.  HENT: Negative for ear pain.   Eyes: Negative for blurred vision and pain.  Respiratory: Negative for cough, shortness of breath and wheezing.   Cardiovascular: Negative for chest pain and leg swelling.  Gastrointestinal: Negative for nausea, vomiting, abdominal pain, diarrhea and constipation.  Genitourinary: Negative for dysuria, urgency and frequency.  Musculoskeletal: Positive for back pain. Negative for myalgias.  Skin: Negative for itching and rash.  Neurological: Positive for tingling and sensory change. Negative for dizziness, focal weakness and headaches.   Objective:  Physical Exam: Filed Vitals:   02/01/15 1453  BP: 116/65  Pulse: 80  Temp: 98.1 F (36.7 C)  TempSrc: Oral  Weight: 167 lb 1.6 oz (75.796 kg)    SpO2: 98%  Physical Exam  Constitutional: She is oriented to person, place, and time. She appears well-developed and well-nourished. No distress.  HENT:  Head: Normocephalic and atraumatic.  Eyes: EOM are normal. Pupils are equal, round, and reactive to light.  Neck: Neck supple. No tracheal deviation present.  Cardiovascular: Normal rate, regular rhythm and intact distal pulses.   Pulmonary/Chest: Effort normal. No respiratory distress. She has no wheezes. She has no rales.  Abdominal: Soft. Bowel sounds are normal. She exhibits no distension. There is no tenderness.  Musculoskeletal: Normal range of motion. She exhibits no edema or tenderness.  Thoracic and lumbar paraspinal muscles feel tight but are non-TTP.  Neurological: She is alert and oriented to person, place, and time.  CN II-XII grossly intact Sensation decreased to pinprick and light touch in hands and feet bilaterally.  Motor strength grossly intact in bilateral upper and lower extremities.  Straight leg raise test negative bilaterally.   Skin: Skin is warm and dry.   Assessment & Plan:

## 2015-02-07 NOTE — Progress Notes (Signed)
I saw and evaluated the patient.  I personally confirmed the key portions of Dr. Rathore's history and exam and reviewed pertinent patient test results.  The assessment, diagnosis, and plan were formulated together and I agree with the documentation in the resident's note. 

## 2015-02-28 ENCOUNTER — Other Ambulatory Visit: Payer: Self-pay | Admitting: Internal Medicine

## 2015-03-14 ENCOUNTER — Ambulatory Visit (INDEPENDENT_AMBULATORY_CARE_PROVIDER_SITE_OTHER): Payer: Medicaid Other | Admitting: Internal Medicine

## 2015-03-14 ENCOUNTER — Encounter: Payer: Self-pay | Admitting: Internal Medicine

## 2015-03-14 VITALS — BP 102/66 | HR 78 | Temp 98.2°F | Ht 65.0 in | Wt 170.0 lb

## 2015-03-14 DIAGNOSIS — M62830 Muscle spasm of back: Secondary | ICD-10-CM | POA: Diagnosis not present

## 2015-03-14 DIAGNOSIS — G620 Drug-induced polyneuropathy: Secondary | ICD-10-CM | POA: Diagnosis not present

## 2015-03-14 DIAGNOSIS — T451X5A Adverse effect of antineoplastic and immunosuppressive drugs, initial encounter: Secondary | ICD-10-CM | POA: Diagnosis not present

## 2015-03-14 DIAGNOSIS — F339 Major depressive disorder, recurrent, unspecified: Secondary | ICD-10-CM | POA: Diagnosis present

## 2015-03-14 MED ORDER — CYCLOBENZAPRINE HCL 10 MG PO TABS: ORAL_TABLET | ORAL | Status: DC

## 2015-03-14 MED ORDER — SERTRALINE HCL 50 MG PO TABS: 50.0000 mg | ORAL_TABLET | Freq: Every day | ORAL | Status: DC

## 2015-03-14 MED ORDER — GABAPENTIN 600 MG PO TABS: 600.0000 mg | ORAL_TABLET | Freq: Three times a day (TID) | ORAL | Status: DC

## 2015-03-14 MED ORDER — DICLOFENAC SODIUM 1 % TD GEL: 2.0000 g | Freq: Four times a day (QID) | TRANSDERMAL | Status: DC

## 2015-03-14 NOTE — Progress Notes (Signed)
Patient ID: Karen Daniels, female   DOB: [birth year], 39 y.o.   MRN: [id number]   Subjective:   Patient ID: Karen Daniels female   DOB: Jul 23, 1975 39 y.o.   MRN: [id number]  HPI: Ms.Karen Daniels is a 39 y.o. 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 Please see assessment and plan for the status of the patient's chronic medical conditions.   She is also complaining of a two-week history of depression, forgetfulness, and decreased concentration. She became tearful while speaking. States she forgot to pick up her kids from the bus stop twice in these past 2 weeks, cannot focus on any tasks, and is yelling at her kids for no reason. Also reports being worried about trying to maintain her weight as she has gained 45 pounds in the past 2 months. States she watches her diet and exercises greater than 1 hour, 7 days a week. Patient also reports having "mood swings" but denies having any manic symptoms. States she was diagnosed with depression 7 years ago when she was going through a divorce and was treated with Lexapro at the time. However, this time she is not sure what is causing her to feel this way as she does not have any obvious life stressors. States she just moved to new apartment and things are going well in her life. Denies having any relationship problems. 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 She does report obsessively cleaning her house all the time and states she cannot sit down for even 5 minutes because she is always worried about cleaning and organizing. Patient was worried that she might have a thyroid problem but denies having any skin/hair changes, constipation, or cold intolerance. She was asked to fill out a pH Q9 questionnaire at this visit and the score was 16.    Past Medical History    Diagnosis Date  . 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 Anxiety   . Depression   . Adenocarcinoma of cervix, stage 1 ([place])     dx  may 2015   STAGE I B2 (+pet scan pelvic nodes for mets)---  oncologist--  dr Marko Plume and dr Sondra Come--  chemo and external radiation    . GERD (gastroesophageal reflux disease)   . Radiation 10/11/13-11/23/13    whole pelvis 45 gray   Current Outpatient Prescriptions  Medication Sig Dispense Refill  . acyclovir (ZOVIRAX) 400 MG tablet Take 400 mg by mouth 2 (two) times daily.     . cyclobenzaprine (FLEXERIL) 10 MG tablet TAKE 1 TABLET (10 MG TOTAL) BY MOUTH AT BEDTIME. AS NEEDED FOR BACK MUSCLE SPASMS. 30 tablet 0  . diclofenac sodium (VOLTAREN) 1 % GEL Apply 2 g topically 4 (four) times daily. 100 g 2  . gabapentin (NEURONTIN) 600 MG tablet Take 1 tablet (600 mg total) by mouth 3 (three) times daily. 30 tablet 2  . HYDROcodone-acetaminophen (NORCO/VICODIN) 5-325 MG tablet Take 1 tablet by mouth every 6 (six) hours as needed for moderate pain. 15 tablet 0  . predniSONE (DELTASONE) 50 MG tablet Take 1 tablet (50 mg total) by mouth daily with breakfast. 5 tablet 0  . sertraline (ZOLOFT) 50 MG tablet Take 1 tablet (50 mg total) by mouth daily. 30 tablet 1  . [DISCONTINUED] norethindrone (MICRONOR,CAMILA,ERRIN) 0.35 MG tablet Take 1 tablet (0.35 mg  total) by mouth daily. 1 Package 11   No current facility-administered medications for this visit.   Family History  Problem Relation Age of Onset  . Heart disease Father   . COPD Father   . Diabetes Father   . Hypertension Father   . Arthritis Father   . Arthritis Mother   . Breast cancer Maternal Aunt   . Diabetes Maternal Grandmother   . Cancer Maternal Grandmother     skin cancer  . Heart disease Maternal Grandmother   . Breast cancer Paternal Grandmother   . Cancer Maternal Aunt     lung   Social History   Social History  . Marital Status: Divorced    Spouse Name: N/A  . Number of  Children: 2  . Years of Education: N/A   Occupational History  .     Social History Main Topics  . Smoking status: Former Smoker -- 0.50 packs/day for 2 years    Types: Cigarettes    Quit date: 08/30/2013  . Smokeless tobacco: Never Used     Comment: Quit in May 2015  . Alcohol Use: 0.0 oz/week    0 Standard drinks or equivalent per week     Comment: occasionally  . Drug Use: No  . Sexual Activity: Yes   Other Topics Concern  . None   Social History Narrative   Review of Systems:  Constitutional: Negative for fever and chills.  HENT: Negative for ear pain.  Eyes: Negative for blurred vision and pain.  Respiratory: Negative for cough, shortness of breath and wheezing.  Cardiovascular: Negative for chest pain and leg swelling.  Gastrointestinal: Negative for nausea, vomiting, abdominal pain, diarrhea and constipation.  Genitourinary: Negative for dysuria, urgency and frequency.  Musculoskeletal: Positive for back pain. Negative for myalgias.  Skin: Negative for itching and rash.  Neurological: Positive for tingling and sensory change. Negative for dizziness, focal weakness and headaches.  Psychiatric: Depression  Objective:  Physical Exam: Filed Vitals:   03/14/15 1422  BP: 102/66  Pulse: 78  Temp: 98.2 F (36.8 C)  TempSrc: Oral  Height: 5\' 5"  (1.651 m)  Weight: 77.111 kg (170 lb)  SpO2: 98%   Head: Normocephalic and atraumatic.  Eyes: EOM are normal. Pupils are equal, round, and reactive to light.  Neck: Neck supple. No tracheal deviation present.  Cardiovascular: Normal rate, regular rhythm and intact distal pulses.  Pulmonary/Chest: Effort normal. No respiratory distress. She has no wheezes. She has no rales.  Abdominal: Soft. Bowel sounds are normal. She exhibits no distension. There is no tenderness.  Musculoskeletal: Normal range of motion. She exhibits no edema or tenderness. No spinal or paraspinal muscle tenderness. Normal ROM of spine.    Neurological: She is alert and oriented to person, place, and time.  CN II-XII grossly intact Sensation decreased to pinprick and light touch in hands and feet bilaterally.  Motor strength grossly intact in bilateral upper and lower extremities.  Straight leg raise test negative bilaterally.  Skin: Skin is warm and dry.   Assessment & Plan:

## 2015-03-14 NOTE — Patient Instructions (Signed)
Karen Daniels it was nice seeing you today!  -Start taking Sertraline 50 mg: 1 tablet by mouth daily   -Continue taking Flexeril at night as instructed  -Start using Voltaren gel topically 4 times daily    -Dose of your gabapentin has been increased:  1st week: Take 600 mg once a day and 300 mg twice a day  2nd week: Take 600 mg twice a day and 300 mg once a day  3rd week and thereafter: Take 600 mg three times a day    -I have ordered some labs today and will call you when the results come back.  -Enjoy your trip to [facility] and remember to return for a follow up visit in 1 month.

## 2015-03-15 DIAGNOSIS — F339 Major depressive disorder, recurrent, unspecified: Secondary | ICD-10-CM | POA: Insufficient documentation

## 2015-03-15 LAB — TSH: TSH: 1.96 u[IU]/mL (ref 0.450–4.500)

## 2015-03-15 LAB — T4, FREE: FREE T4: 1.07 ng/dL (ref 0.82–1.77)

## 2015-03-15 NOTE — Assessment & Plan Note (Addendum)
Patient states Flexeril prescribed to her during her previous visit for paraspinal spinal muscle spasms (thoracolumbar region) helps her sleep at night, however, she still continues to have back muscle pain/ spasms during the day. No spinal or paraspinal muscle tenderness on physical exam today. Normal range of motion of spine. Patient denies having any alarm symptoms such as bowel or bladder incontinence.  -Continue Flexeril 10 mg daily at bedtime. -Voltaren gel 4 times a day

## 2015-03-15 NOTE — Assessment & Plan Note (Addendum)
Patient has a history of depression 7 years ago and was treated with Lexapro at the time. She now reports having depression, forgetfulness, and decreased concentration for the past 2 weeks without any obvious life stressors. Her PHQ9 score at this visit was 16. She does exhibit obsessive-compulsive symptoms such as exercising vigorously 7 days a week (greater than 1 hour each day) and cleaning/ organizing obsessively to the point that it is causing her distress. She endorses a 45 pound weight gain in the past 2 months but in fact gained 5 pounds in this time period as per Epic records. Patient also reports having "mood swings" but denies having any manic symptoms, making bipolar disorder less likely. She was worried about having a thyroid problem but denied having any skin/hair changes, constipation, or cold intolerance. TSH and free T4 checked at this visit are normal.  -Sertraline 50 mg daily as it is treatment for both MDD and OCD -Follow-up visit in one month. If patient does not experience any improvement in her mood, will consider referral to psychiatry.

## 2015-03-15 NOTE — Assessment & Plan Note (Signed)
During her previous visit, patient was prescribed gabapentin 300 mg 3 times a day. Today she reports not having sharp groin pain radiating to her left leg anymore. States the numbness and tingling in her hands and feet is better since she started taking this medication, however, it has not resolved and it still continues to bother her.  -Gabapentin dose increased to 600 mg 3 times a day -Follow-up visit in one month, reassess symptoms

## 2015-03-16 NOTE — Progress Notes (Signed)
I saw and evaluated the patient.  I personally confirmed the key portions of Dr. Rathore's history and exam and reviewed pertinent patient test results.  The assessment, diagnosis, and plan were formulated together and I agree with the documentation in the resident's note. 

## 2015-03-17 ENCOUNTER — Telehealth: Payer: Self-pay | Admitting: Internal Medicine

## 2015-03-17 NOTE — Telephone Encounter (Signed)
Sending to [employer] rep

## 2015-03-17 NOTE — Telephone Encounter (Signed)
[facility] from [facility] requesting PA for voltaren gel and gabapentin.

## 2015-04-13 ENCOUNTER — Encounter: Payer: Self-pay | Admitting: Internal Medicine

## 2015-04-13 ENCOUNTER — Ambulatory Visit (INDEPENDENT_AMBULATORY_CARE_PROVIDER_SITE_OTHER): Payer: Medicaid Other | Admitting: Internal Medicine

## 2015-04-13 VITALS — BP 120/84 | HR 71 | Temp 98.2°F | Ht 65.0 in | Wt 169.2 lb

## 2015-04-13 DIAGNOSIS — G620 Drug-induced polyneuropathy: Secondary | ICD-10-CM | POA: Diagnosis not present

## 2015-04-13 DIAGNOSIS — T451X5A Adverse effect of antineoplastic and immunosuppressive drugs, initial encounter: Secondary | ICD-10-CM

## 2015-04-13 DIAGNOSIS — F339 Major depressive disorder, recurrent, unspecified: Secondary | ICD-10-CM

## 2015-04-13 DIAGNOSIS — T451X5D Adverse effect of antineoplastic and immunosuppressive drugs, subsequent encounter: Secondary | ICD-10-CM | POA: Diagnosis not present

## 2015-04-13 MED ORDER — SERTRALINE HCL 100 MG PO TABS: 100.0000 mg | ORAL_TABLET | Freq: Every day | ORAL | Status: DC

## 2015-04-13 NOTE — Addendum Note (Signed)
Addended by: Oval Linsey D on: 04/13/2015 10:13 PM   Modules accepted: Level of Service

## 2015-04-13 NOTE — Assessment & Plan Note (Signed)
Pt doing well with gabapentin. -cont current dose of gabapentin

## 2015-04-13 NOTE — Progress Notes (Signed)
Case discussed with Dr. Gill soon after the resident saw the patient.  We reviewed the resident's history and exam and pertinent patient test results.  I agree with the assessment, diagnosis, and plan of care documented in the resident's note. 

## 2015-04-13 NOTE — Progress Notes (Signed)
Patient ID: Karen Daniels, female   DOB: 1975-07-06, 39 y.o.   MRN: [id number]     Subjective:   Patient ID: Karen Daniels female    DOB: 03-18-1976 39 y.o.    MRN: [id number] Health Maintenance Due: Health Maintenance Due  Topic Date Due  . TETANUS/TDAP  05/10/1995  . INFLUENZA VACCINE  11/14/2014    _________________________________________________  HPI: Karen Daniels is a 39 y.o. female here for a f/u visit since starting on zoloft for depression.  Pt has a PMH outlined below.  Please see problem-based charting assessment and plan for further status of patient's chronic medical problems addressed at today's visit.  PMH: Past Medical History  Diagnosis Date  . 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 Anxiety   . Depression   . Adenocarcinoma of cervix, stage 1 ([place])     dx  may 2015   STAGE I B2 (+pet scan pelvic nodes for mets)---  oncologist--  dr Marko Plume and dr Sondra Come--  chemo and external radiation    . GERD (gastroesophageal reflux disease)   . Radiation 10/11/13-11/23/13    whole pelvis 45 gray    Medications: Current Outpatient Prescriptions on File Prior to Visit  Medication Sig Dispense Refill  . acyclovir (ZOVIRAX) 400 MG tablet Take 400 mg by mouth 2 (two) times daily.     . cyclobenzaprine (FLEXERIL) 10 MG tablet TAKE 1 TABLET (10 MG TOTAL) BY MOUTH AT BEDTIME. AS NEEDED FOR BACK MUSCLE SPASMS. 30 tablet 0  . diclofenac sodium (VOLTAREN) 1 % GEL Apply 2 g topically 4 (four) times daily. 100 g 2  . gabapentin (NEURONTIN) 600 MG tablet Take 1 tablet (600 mg total) by mouth 3 (three) times daily. 30 tablet 2  . HYDROcodone-acetaminophen (NORCO/VICODIN) 5-325 MG tablet Take 1 tablet by mouth every 6 (six) hours as needed for moderate pain. 15 tablet 0  . predniSONE (DELTASONE) 50 MG tablet Take 1 tablet (50 mg total) by mouth daily with breakfast. 5 tablet 0  . sertraline (ZOLOFT) 50 MG tablet Take 1 tablet (50 mg total) by mouth daily. 30 tablet 1    . [DISCONTINUED] norethindrone (MICRONOR,CAMILA,ERRIN) 0.35 MG tablet Take 1 tablet (0.35 mg total) by mouth daily. 1 Package 11   No current facility-administered medications on file prior to visit.    Allergies: No Known Allergies  FH: Family History  Problem Relation Age of Onset  . Heart disease Father   . COPD Father   . Diabetes Father   . Hypertension Father   . Arthritis Father   . Arthritis Mother   . Breast cancer Maternal Aunt   . Diabetes Maternal Grandmother   . Cancer Maternal Grandmother     skin cancer  . Heart disease Maternal Grandmother   . Breast cancer Paternal Grandmother   . Cancer Maternal Aunt     lung    SH: Social History   Social History  . Marital Status: Divorced    Spouse Name: N/A  . Number of Children: 2  . Years of Education: N/A   Occupational History  .     Social History Main Topics  . Smoking status: Former Smoker -- 0.50 packs/day for 2 years    Types: Cigarettes    Quit date: 08/30/2013  . Smokeless tobacco: Never Used     Comment: Quit in May 2015  . Alcohol Use: 0.0 oz/week    0 Standard drinks or equivalent per week  Comment: occasionally  . Drug Use: No  . Sexual Activity: Yes   Other Topics Concern  . Not on file   Social History Narrative    Review of Systems: Constitutional: Negative for fever, chills and weight loss.  Eyes: Negative for blurred vision.  Respiratory: Negative for cough and shortness of breath.  Cardiovascular: Negative for chest pain, palpitations and leg swelling.  Gastrointestinal: Negative for nausea, vomiting, abdominal pain, diarrhea, constipation and blood in stool.  Genitourinary: Negative for dysuria, urgency and frequency.  Musculoskeletal: Negative for myalgias and +back pain.  Neurological: Negative for dizziness, weakness and headaches.     Objective:   Vital Signs: Filed Vitals:   04/13/15 0833  BP: 120/84  Pulse: 71  Temp: 98.2 F (36.8 C)  TempSrc: Oral   Weight: 169 lb 3.2 oz (76.749 kg)  SpO2: 98%      BP Readings from Last 3 Encounters:  04/13/15 120/84  03/14/15 102/66  02/01/15 116/65    Physical Exam: Constitutional: Vital signs reviewed.  Patient is in NAD and cooperative with exam.  Head: Normocephalic and atraumatic. Eyes: EOMI, conjunctivae nl, no scleral icterus.  Neck: Supple. Cardiovascular: RRR, no MRG. Pulmonary/Chest: normal effort, CTAB, no wheezes, rales, or rhonchi. Abdominal: Soft. NT/ND +BS. Neurological: A&O x3, cranial nerves II-XII are grossly intact, moving all extremities. Extremities: 2+DP b/l; no pitting edema. Skin: Warm, dry and intact. No rash.   Assessment & Plan:   Assessment and plan was discussed and formulated with my attending.

## 2015-04-13 NOTE — Assessment & Plan Note (Addendum)
Pt here for f/u OV since starting zoloft.  She is taking 50mg  currently and feels much better, however, she states it starts to wear off at the end of the day.  We discussed going up to 100mg  daily and she would like to try the dose increase. -increase zoloft to 100mg  daily  -f/u in 6 wks  -advised to call [facility] if experiences any problems in the meantime

## 2015-04-13 NOTE — Patient Instructions (Signed)
Thank you for your visit today.   Please return to the internal medicine clinic in about 6 weeks or sooner if needed.     I have made the following additions/changes to your medications: We will increase your dose of sertraline to 100mg  daily.    Please be sure to bring all of your medications with you to every visit; this includes herbal supplements, vitamins, eye drops, and any over-the-counter medications.   Should you have any questions regarding your medications and/or any new or worsening symptoms, please be sure to call the clinic at [phone].   If you believe that you are suffering from a life threatening condition or one that may result in the loss of limb or function, then you should call 911 and proceed to the nearest Emergency Department.   A healthy lifestyle and preventative care can promote health and wellness.   Maintain regular health, dental, and eye exams.  Eat a healthy diet. Foods like vegetables, fruits, whole grains, low-fat dairy products, and lean protein foods contain the nutrients you need without too many calories. Decrease your intake of foods high in solid fats, added sugars, and salt. Get information about a proper diet from your caregiver, if necessary.  Regular physical exercise is one of the most important things you can do for your health. Most adults should get at least 150 minutes of moderate-intensity exercise (any activity that increases your heart rate and causes you to sweat) each week. In addition, most adults need muscle-strengthening exercises on 2 or more days a week.   Maintain a healthy weight. The body mass index (BMI) is a screening tool to identify possible weight problems. It provides an estimate of body fat based on height and weight. Your caregiver can help determine your BMI, and can help you achieve or maintain a healthy weight. For adults 20 years and older:  A BMI below 18.5 is considered underweight.  A BMI of 18.5 to 24.9 is  normal.  A BMI of 25 to 29.9 is considered overweight.  A BMI of 30 and above is considered obese.  Sertraline tablets What is this medicine? SERTRALINE (SER tra leen) is used to treat depression. It may also be used to treat obsessive compulsive disorder, panic disorder, post-trauma stress, premenstrual dysphoric disorder (PMDD) or social anxiety. This medicine may be used for other purposes; ask your health care provider or pharmacist if you have questions. What should I tell my health care provider before I take this medicine? They need to know if you have any of these conditions: -bipolar disorder or a family history of bipolar disorder -diabetes -glaucoma -heart disease -high blood pressure -history of irregular heartbeat -history of low levels of calcium, magnesium, or potassium in the blood -if you often drink alcohol -liver disease -receiving electroconvulsive therapy -seizures -suicidal thoughts, plans, or attempt; a previous suicide attempt by you or a family member -thyroid disease -an unusual or allergic reaction to sertraline, other medicines, foods, dyes, or preservatives -pregnant or trying to get pregnant -breast-feeding How should I use this medicine? Take this medicine by mouth with a glass of water. Follow the directions on the prescription label. You can take it with or without food. Take your medicine at regular intervals. Do not take your medicine more often than directed. Do not stop taking this medicine suddenly except upon the advice of your doctor. Stopping this medicine too quickly may cause serious side effects or your condition may worsen. A special MedGuide will be given  to you by the pharmacist with each prescription and refill. Be sure to read this information carefully each time. Talk to your pediatrician regarding the use of this medicine in children. While this drug may be prescribed for children as young as 7 years for selected conditions,  precautions do apply. Overdosage: If you think you have taken too much of this medicine contact a poison control center or emergency room at once. NOTE: This medicine is only for you. Do not share this medicine with others. What if I miss a dose? If you miss a dose, take it as soon as you can. If it is almost time for your next dose, take only that dose. Do not take double or extra doses. What may interact with this medicine? Do not take this medicine with any of the following medications: -certain medicines for fungal infections like fluconazole, itraconazole, ketoconazole, posaconazole, voriconazole -cisapride -disulfiram -dofetilide -linezolid -MAOIs like Carbex, Eldepryl, Marplan, Nardil, and Parnate -metronidazole -methylene blue (injected into a vein) -pimozide -thioridazine -ziprasidone This medicine may also interact with the following medications: -alcohol -aspirin and aspirin-like medicines -certain medicines for depression, anxiety, or psychotic disturbances -certain medicines for irregular heart beat like flecainide, propafenone -certain medicines for migraine headaches like almotriptan, eletriptan, frovatriptan, naratriptan, rizatriptan, sumatriptan, zolmitriptan -certain medicines for sleep -certain medicines for seizures like carbamazepine, valproic acid, phenytoin -certain medicines that treat or prevent blood clots like warfarin, enoxaparin, dalteparin -cimetidine -digoxin -diuretics -fentanyl -furazolidone -isoniazid -lithium -NSAIDs, medicines for pain and inflammation, like ibuprofen or naproxen -other medicines that prolong the QT interval (cause an abnormal heart rhythm) -procarbazine -rasagiline -supplements like St. John's wort, kava kava, valerian -tolbutamide -tramadol -tryptophan This list may not describe all possible interactions. Give your health care provider a list of all the medicines, herbs, non-prescription drugs, or dietary supplements you  use. Also tell them if you smoke, drink alcohol, or use illegal drugs. Some items may interact with your medicine. What should I watch for while using this medicine? Tell your doctor if your symptoms do not get better or if they get worse. Visit your doctor or health care professional for regular checks on your progress. Because it may take several weeks to see the full effects of this medicine, it is important to continue your treatment as prescribed by your doctor. Patients and their families should watch out for new or worsening thoughts of suicide or depression. Also watch out for sudden changes in feelings such as feeling anxious, agitated, panicky, irritable, hostile, aggressive, impulsive, severely restless, overly excited and hyperactive, or not being able to sleep. If this happens, especially at the beginning of treatment or after a change in dose, call your health care professional. Dennis Bast may get drowsy or dizzy. Do not drive, use machinery, or do anything that needs mental alertness until you know how this medicine affects you. Do not stand or sit up quickly, especially if you are an older patient. This reduces the risk of dizzy or fainting spells. Alcohol may interfere with the effect of this medicine. Avoid alcoholic drinks. Your mouth may get dry. Chewing sugarless gum or sucking hard candy, and drinking plenty of water may help. Contact your doctor if the problem does not go away or is severe. What side effects may I notice from receiving this medicine? Side effects that you should report to your doctor or health care professional as soon as possible: -allergic reactions like skin rash, itching or hives, swelling of the face, lips, or tongue -black or bloody  stools, blood in the urine or vomit -fast, irregular heartbeat -feeling faint or lightheaded, falls -hallucination, loss of contact with reality -seizures -suicidal thoughts or other mood changes -unusual bleeding or  bruising -unusually weak or tired -vomiting Side effects that usually do not require medical attention (report to your doctor or health care professional if they continue or are bothersome): -change in appetite -change in sex drive or performance -diarrhea -increased sweating -indigestion, nausea -tremors This list may not describe all possible side effects. Call your doctor for medical advice about side effects. You may report side effects to FDA at 1-800-FDA-1088. Where should I keep my medicine? Keep out of the reach of children. Store at room temperature between 15 and 30 degrees C (59 and 86 degrees F). Throw away any unused medicine after the expiration date. NOTE: This sheet is a summary. It may not cover all possible information. If you have questions about this medicine, talk to your doctor, pharmacist, or health care provider.    2016, [employer]. (2012-10-27 12:57:35)

## 2015-04-27 ENCOUNTER — Telehealth: Payer: Self-pay

## 2015-04-27 NOTE — Telephone Encounter (Signed)
Patient of Dr Elenora Gamma called with concerns of new onset of pain located to her "right" breast , patient states she is not certain , but thinks she felt a lump. Melissa Cross , APNP updated , orders received to schedule the patient to be evaluated tomorrow at 1:30 PM . Patient updated with scheduled appointment , patient agreed to plan , denies further questions and or concerns at this time.

## 2015-04-28 ENCOUNTER — Telehealth: Payer: Self-pay

## 2015-04-28 ENCOUNTER — Other Ambulatory Visit: Payer: Self-pay | Admitting: Gynecologic Oncology

## 2015-04-28 ENCOUNTER — Ambulatory Visit: Payer: Medicaid Other | Attending: Gynecologic Oncology | Admitting: Gynecologic Oncology

## 2015-04-28 ENCOUNTER — Encounter: Payer: Self-pay | Admitting: Gynecologic Oncology

## 2015-04-28 VITALS — BP 118/69 | HR 92 | Temp 98.4°F | Resp 18 | Wt 167.2 lb

## 2015-04-28 DIAGNOSIS — G44209 Tension-type headache, unspecified, not intractable: Secondary | ICD-10-CM

## 2015-04-28 DIAGNOSIS — N644 Mastodynia: Secondary | ICD-10-CM

## 2015-04-28 NOTE — Patient Instructions (Signed)
Plan to have a mammogram to evaluate right breast tenderness.  Call back to the office if the cough persists after several weeks along with the headaches.  Tension Headache A tension headache is a feeling of pain, pressure, or aching that is often felt over the front and sides of the head. The pain can be dull, or it can feel tight (constricting). Tension headaches are not normally associated with nausea or vomiting, and they do not get worse with physical activity. Tension headaches can last from 30 minutes to several days. This is the most common type of headache. CAUSES The exact cause of this condition is not known. Tension headaches often begin after stress, anxiety, or depression. Other triggers may include:  Alcohol.  Too much caffeine, or caffeine withdrawal.  Respiratory infections, such as colds, flu, or sinus infections.  Dental problems or teeth clenching.  Fatigue.  Holding your head and neck in the same position for a long period of time, such as while using a computer.  Smoking. SYMPTOMS Symptoms of this condition include:  A feeling of pressure around the head.  Dull, aching head pain.  Pain felt over the front and sides of the head.  Tenderness in the muscles of the head, neck, and shoulders. DIAGNOSIS This condition may be diagnosed based on your symptoms and a physical exam. Tests may be done, such as a CT scan or an MRI of your head. These tests may be done if your symptoms are severe or unusual. TREATMENT This condition may be treated with lifestyle changes and medicines to help relieve symptoms. HOME CARE INSTRUCTIONS Managing Pain  Take over-the-counter and prescription medicines only as told by your health care provider.  Lie down in a dark, quiet room when you have a headache.  If directed, apply ice to the head and neck area:  Put ice in a plastic bag.  Place a towel between your skin and the bag.  Leave the ice on for 20 minutes, 2-3 times per  day.  Use a heating pad or a hot shower to apply heat to the head and neck area as told by your health care provider. Eating and Drinking  Eat meals on a regular schedule.  Limit alcohol use.  Decrease your caffeine intake, or stop using caffeine. General Instructions  Keep all follow-up visits as told by your health care provider. This is important.  Keep a headache journal to help find out what may trigger your headaches. For example, write down:  What you eat and drink.  How much sleep you get.  Any change to your diet or medicines.  Try massage or other relaxation techniques.  Limit stress.  Sit up straight, and avoid tensing your muscles.  Do not use tobacco products, including cigarettes, chewing tobacco, or e-cigarettes. If you need help quitting, ask your health care provider.  Exercise regularly as told by your health care provider.  Get 7-9 hours of sleep, or the amount recommended by your health care provider. SEEK MEDICAL CARE IF:  Your symptoms are not helped by medicine.  You have a headache that is different from what you normally experience.  You have nausea or you vomit.  You have a fever. SEEK IMMEDIATE MEDICAL CARE IF:  Your headache becomes severe.  You have repeated vomiting.  You have a stiff neck.  You have a loss of vision.  You have problems with speech.  You have pain in your eye or ear.  You have muscular weakness or  loss of muscle control.  You lose your balance or you have trouble walking.  You feel faint or you pass out.  You have confusion.   This information is not intended to replace advice given to you by your health care provider. Make sure you discuss any questions you have with your health care provider.   Document Released: 04/01/2005 Document Revised: 12/21/2014 Document Reviewed: 07/25/2014 Elsevier Interactive Patient Education [employer].

## 2015-04-28 NOTE — Telephone Encounter (Signed)
Patient contacted and updated with mammogram being scheduled for Tuesday January 17 , 2017 at 1:45 PM at [facility] , [address] , [place]: [phone] . Patient also instructed to bring ID and insurance cards , patient also instructed not to put any deoderant , lotions or powders on the day of the imaging . Patient states understanding , denies further questions at this time.

## 2015-05-01 NOTE — Progress Notes (Signed)
Follow Up Note: Gyn-Onc  Karen Daniels 40 y.o. female  CC:  Chief Complaint  Patient presents with  . Right Breast Pain    Follow up  Headaches  HPI:  Karen Daniels is a 40 y.o. 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     Cervical cancer ([facility])   08/29/2013 Initial Diagnosis Presented to ED with vaginal discharge. Found to have large friable mass, biopsy showed adenocarcinoma of cervix, no LVSI.   09/07/2013 -  Cancer Staged Saw Dr. Alycia Rossetti in [place]. Exam showed 8cm mass fillign vagina, no parametrial or sidewall involvement - clinical IB2. PET showed positive pelvic LN.   10/11/2013 - 11/23/2013 Radiation EBRT, total of 45 cGy to whole pelvis and additional 9 cGy to PET positive nodes. Suboptimal response, not candidate for HDR.   10/11/2013 - 11/08/2013 Chemotherapy Sensitizing cisplatin 40 mg/m2 x5 cycles. Cycle 6 held due to heavy vaginal bleeding on 11/09/13, requiring admission to hospital and vaginal packing and transfusion of 3U pRBCs. Exam 11/15/13 showed persistent 5cm bulky, friable mass.   12/24/2013 Surgery Abdominal modified radical hysterectomy, bilateral salping-oophorectomy, bilateral pelvic lymphadenectomy.   02/22/2014 Radiation boost vaginal brachytherapy   01/24/2014 - 03/14/2014 Chemotherapy 3 cycles paclitaxel and carboplatin    Given her poor response to chemoradiation, with a persistent 5cm friable cervical mass, the decision was made to proceed with completion hysterectomy.   Procedure:  12/24/13 - Abdominal modified radical hysterectomy, bilateral salpingo-oophorectomy, bilateral pelvic lymphadenectomy.  Intraoperative Findings:  There was an exophytic 5-6 cm cervical tumor with no evidence of parametrial invasion or obvious metastatic disease. The tubes and ovaries  appeared normal bilaterally. The appendix appeared normal.  Pathology:  Accession #: [id number]  Diagnosis: A: Uterus, cervix, bilateral tubes and ovaries, parametria, radical hysterectomy and upper vaginectomy  Histologic type: Mucinous adenocarcinoma  Histologic grade: well differentiated (see comment) Tumor focality/site: (quadrant(s) of cervix) circumferential  Tumor size: (greatest dimension) 5.0 cm  Extent of invasion: Depth: 1.2 cm Wall thickness: 1.8 cm Percent: 71% Lymphovascular space invasion: present  Parametrial soft tissue involvement: negative  Vaginal cuff involvement: positive for invasive adenocarcinoma from approximately 8 to 10 o'clock  Surgical margins:  Invasive: vaginal cuff margins positive from 8 to 10 o'clock  Intraepithelial: not applicable, none seen  Regional lymph nodes (see specimens B and C): Total number involved: 0.  Total number examined: 8 Additional pathologic findings:  Endometrium: atrophy Myometrium: small benign leiomyomata  Right ovary: atrophy  Right fallopian tube: no significant pathologic abnormality, no neoplasm identified Left ovary: atrophy  Left fallopian tube: no significant pathologic abnormality, no neoplasm identified   B: Lymph nodes, right pelvic, regional resection  - No evidence of metastatic disease in five lymph nodes (0/5)   C: Lymph nodes, left pelvic, regional resection  - No evidence of metastatic disease in three lymph nodes (0/3)   Comment: The tumor cells exhibit grade 2 nuclear changes, but some of the atypia may be secondary to radiation effect. Many of the tumor cells are flattened. The infiltrating glands contain abundant mucin.   She was dispositioned to vaginal brachytherapy and Carbo/Taxol and is s/p 3 additional cycles of carboplatin which she completed in 11/15 as well as completing her vaginal brachytherapy.  She last saw Dr. Alycia Rossetti on 06/13/14 at [facility] with an unremarkable examination and  recommendation  for follow up in August 2016 with a CT scan at that time.    Interval History:  She presents today with her daughter for evaluation of right breast discomfort and new onset of headaches after increasing zoloft dose.  The right breast pain began 1 week ago.  She denies injuring or straining the area.  Denies radiation of the discomfort.  She does not perform breast exams but states she thinks she may feel a lump in that area.  Denies nipple discharge or bleeding.  Denies any skin changes with her breasts.  She states her headaches began 2.5 weeks ago.  "Sometimes they start in the front of my head then sometimes in the back."  Describes the headaches as throbbing, intermittent, relieved without the use of medication after 30 minutes.  Denies visual or hearing changes.  Denies neurological changes such as muscle twitching, dizziness, lightheadedness, fainting.  Also reporting diarrhea for the past two weeks and a dry cough x 1 day.  No other concerns voiced.  She states she is going out of town this weekend and then again on Wednesday for her daughter's cheerleading competitions.  She denies ever having a mammogram and reports a family history of breast cancer with a maternal and paternal aunt.  See below for a detailed review of systems.    Review of Systems  Constitutional: Feels well.  No fever or chills, early satiety, unintentional weight loss or gain.  Cardiovascular: Mild non-productive cough x 1 day.  No chest pain or edema.  Pulmonary: No wheeze.  Gastrointestinal: Diarrhea x 2 weeks.  No nausea, vomiting. No bright red blood per rectum or change in bowel movement.  Genitourinary: No frequency, urgency, or dysuria. No vaginal bleeding.  Musculoskeletal: No myalgia or joint pain. Neurologic: Headaches for 2.5 weeks.  No weakness, numbness, or change in gait.  Psychology: No depression, anxiety, or insomnia.  Current Meds:  Outpatient Encounter Prescriptions as of 04/28/2015  Medication Sig  .  acyclovir (ZOVIRAX) 400 MG tablet Take 400 mg by mouth 2 (two) times daily.   . sertraline (ZOLOFT) 100 MG tablet Take 1 tablet (100 mg total) by mouth daily.  . cyclobenzaprine (FLEXERIL) 10 MG tablet TAKE 1 TABLET (10 MG TOTAL) BY MOUTH AT BEDTIME. AS NEEDED FOR BACK MUSCLE SPASMS. (Patient not taking: Reported on 04/28/2015)  . diclofenac sodium (VOLTAREN) 1 % GEL Apply 2 g topically 4 (four) times daily. (Patient not taking: Reported on 04/28/2015)  . gabapentin (NEURONTIN) 600 MG tablet Take 1 tablet (600 mg total) by mouth 3 (three) times daily. (Patient not taking: Reported on 04/28/2015)   No facility-administered encounter medications on file as of 04/28/2015.    Allergy: No Known Allergies  Social Hx:   Social History   Social History  . Marital Status: Divorced    Spouse Name: N/A  . Number of Children: 2  . Years of Education: N/A   Occupational History  .     Social History Main Topics  . Smoking status: Former Smoker -- 0.50 packs/day for 2 years    Types: Cigarettes    Quit date: 08/30/2013  . Smokeless tobacco: Never Used     Comment: Quit in May 2015  . Alcohol Use: 0.0 oz/week    0 Standard drinks or equivalent per week     Comment: occasionally  . Drug Use: No  . Sexual Activity: Yes   Other Topics Concern  . Not on file   Social History Narrative  Past Surgical Hx:  Past Surgical History  Procedure Laterality Date  . Wisdom tooth extraction    . Colposcopy    . Diagnostic laparoscopy  07-28-2000    w/ lysis adhesions  . Colonoscopy  07-11-1999  . Abdominal hysterectomy  12/24/13    Past Medical Hx:  Past Medical History  Diagnosis Date  . 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 Anxiety   . Depression   . Adenocarcinoma of cervix, stage 1 ([place])     dx  may 2015   STAGE I B2 (+pet scan pelvic nodes for mets)---  oncologist--  dr Marko Plume and dr Sondra Come--  chemo and external radiation    . GERD (gastroesophageal reflux disease)   .  Radiation 10/11/13-11/23/13    whole pelvis 45 gray    Family Hx:  Family History  Problem Relation Age of Onset  . Heart disease Father   . COPD Father   . Diabetes Father   . Hypertension Father   . Arthritis Father   . Arthritis Mother   . Breast cancer Maternal Aunt   . Diabetes Maternal Grandmother   . Cancer Maternal Grandmother     skin cancer  . Heart disease Maternal Grandmother   . Breast cancer Paternal Grandmother   . Cancer Maternal Aunt     lung    Vitals:  Blood pressure 118/69, pulse 92, temperature 98.4 F (36.9 C), temperature source Oral, resp. rate 18, weight 167 lb 3.2 oz (75.841 kg), last menstrual period 11/16/2013, SpO2 97 %.    Physical Exam:  General: Well developed, well nourished female in no acute distress. Alert and oriented x 3.  Neuro/Neck: Sclerae anicteric.  PERRLA.  Supple without any enlargements.  Lymph node survey: No cervical, supraclavicular, or inguinal adenopathy.  Cardiovascular: Regular rate and rhythm. S1 and S2 normal.  Lungs: Clear to auscultation bilaterally. No wheezes/crackles/rhonchi noted.  Skin: No rashes or lesions present. Back: No CVA tenderness.  Breasts: Inspection negative bilaterally.  Tenderness with light palpation of the right inner quadrant of the breast and the chest wall beneath the left breast.  Fullness ?fibrocystic changes noted along the lateral aspect of the right breast at 3 o'clock.  No erythema or discharge noted bilaterally. Abdomen: Abdomen soft, non-obese, non-tender.  Active bowel sounds in all quadrants. No evidence of a fluid wave or abdominal masses.  Extremities: No bilateral cyanosis, edema, or clubbing.   Assessment/Plan:   Karen Daniels is a 40 y.o. 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 Dispositioned to vaginal brachytherapy and consideration for further treatment with Carbo/Taxol after completing radiation treatment.  She completed cycle #3 of 3 paclitaxel and  carboplatin on 03/14/14 and completed her radiation 2 weeks from that day.  Diagnostic mammogram ordered today and to be performed at [facility] on Tues, Jan 17 due to right breast discomfort and fullness at 3 o'clock.  Information given on tension headaches.  She is advised to call the office if the headaches worsen or persist, neurological symptoms develop, the cough persists for 2 weeks or worsens, the diarrhea worsens or persists.  Information also given and discussed on zoloft including side effects of loose stools and the risk/signs of serotonin syndrome.  Reportable signs and symptoms reviewed.  We will follow up on the results of the mammogram.  Reportable signs and symptoms reviewed.  She is advised to call for any questions or concerns.      CROSS, MELISSA DEAL, NP 05/01/2015, 9:31 AM

## 2015-05-17 ENCOUNTER — Other Ambulatory Visit: Payer: Self-pay | Admitting: Internal Medicine

## 2015-05-25 ENCOUNTER — Encounter: Payer: Self-pay | Admitting: Internal Medicine

## 2015-05-25 ENCOUNTER — Ambulatory Visit (INDEPENDENT_AMBULATORY_CARE_PROVIDER_SITE_OTHER): Payer: Medicaid Other | Admitting: Internal Medicine

## 2015-05-25 VITALS — BP 121/75 | HR 66 | Temp 98.1°F | Ht 65.0 in | Wt 172.6 lb

## 2015-05-25 DIAGNOSIS — R1011 Right upper quadrant pain: Secondary | ICD-10-CM | POA: Diagnosis present

## 2015-05-25 DIAGNOSIS — F331 Major depressive disorder, recurrent, moderate: Secondary | ICD-10-CM

## 2015-05-25 DIAGNOSIS — F339 Major depressive disorder, recurrent, unspecified: Secondary | ICD-10-CM | POA: Diagnosis not present

## 2015-05-25 MED ORDER — CITALOPRAM HYDROBROMIDE 20 MG PO TABS: 20.0000 mg | ORAL_TABLET | Freq: Every day | ORAL | Status: DC

## 2015-05-25 NOTE — Assessment & Plan Note (Signed)
Patient very classically describes symptoms of biliary colic; sharp RUQ pain related to food. No tenderness on exam, Murphy's negative.  -RUQ [place] to assess for cholelithiasis -If positive, can consider referral to surgery for elective cholecystectomy

## 2015-05-25 NOTE — Patient Instructions (Signed)
1. Please make a follow up appointment for 1 month  2. Please take all medications as previously prescribed with the following changes:  STOP taking Zoloft.   Start taking Celexa 20 mg daily for depression.   Your abdominal pain sounds like gall stones and an ultrasound will help [place] identify if this is the case.   3. If you have worsening of your symptoms or new symptoms arise, please call the clinic [phone] or go to the ER immediately if symptoms are severe.  Biliary Colic Biliary colic is a pain in the upper abdomen. The pain:  Is usually felt on the right side of the abdomen, but it may also be felt in the center of the abdomen, just below the breastbone (sternum).  May spread back toward the right shoulder blade.  May be steady or irregular.  May be accompanied by nausea and vomiting. Most of the time, the pain goes away in 1-5 hours. After the most intense pain passes, the abdomen may continue to ache mildly for about 24 hours. Biliary colic is caused by a blockage in the bile duct. The bile duct is a pathway that carries bile--a liquid that helps to digest fats--from the gallbladder to the small intestine. Biliary colic usually occurs after eating, when the digestive system demands bile. The pain develops when muscle cells contract forcefully to try to move the blockage so that bile can get by. HOME CARE INSTRUCTIONS  Take medicines only as directed by your health care provider.  Drink enough fluid to keep your urine clear or pale yellow.  Avoid fatty, greasy, and fried foods. These kinds of foods increase your body's demand for bile.  Avoid any foods that make your pain worse.  Avoid overeating.  Avoid having a large meal after fasting. SEEK MEDICAL CARE IF:  You develop a fever.  Your pain gets worse.  You vomit.  You develop nausea that prevents you from eating and drinking. SEEK IMMEDIATE MEDICAL CARE IF:  You suddenly develop a fever and shaking  chills.  You develop a yellowish discoloration (jaundice) of:  Skin.  Whites of the eyes.  Mucous membranes.  You have continuous or severe pain that is not relieved with medicines.  You have nausea and vomiting that is not relieved with medicines.  You develop dizziness or you faint.   This information is not intended to replace advice given to you by your health care provider. Make sure you discuss any questions you have with your health care provider.   Document Released: 09/02/2005 Document Revised: 08/16/2014 Document Reviewed: 01/11/2014 Elsevier Interactive Patient Education [employer].

## 2015-05-25 NOTE — Assessment & Plan Note (Addendum)
Patient recently had dose of Zoloft increased to 100 mg daily in 03/2015. Since that time, she states her depression symptoms have not improved and her symptoms of anxiety and agitation have gotten worse. Does not describe any symptoms of main to suggest underlying Bipolar Disorder. PHQ-9 score of 19 today.  -Discontinue Zoloft. Start Celexa 20 mg daily for now.  -Return in 4 weeks for follow up regarding efficacy of new SSRI. Will likely need increase in dose. -May need further medication for anxiety symptoms in the future. -Can also consider addition of psychotherapy as well for improved treatment for depression with anxiety

## 2015-05-25 NOTE — Progress Notes (Signed)
Subjective:   Patient ID: DELLE QUARTEY female   DOB: Aug 29, 1975 40 y.o.   MRN: [id number]  HPI: [name] is a 40 y.o. female w/ PMHx of GERD, depression, and anxiety, presents to the clinic today for a follow-up visit regarding her depression. Patient states she was started on Zoloft some time ago for her depression symptoms and recently had her dose increased in December to 100 mg daily. Since that time, she states she has not felt any better and actually feels like her anxiety is worse with increased dose. She says she has not wanted to spend much time with people, gets anxious about small things, and continues to have the same symptoms of depression, including decreased energy, changes in her appetite (feels like she has also gained weight), difficulty sleeping, agitation, and inability to concentrate. Denies symptoms of guild suicidal or homicidal ideations.   Her other complaint is that of RUQ pain with food. She describes the pain as intermittent, occurs after she eats, 8/10 in severity, relatively short lived, and sharp in nature. States she just started having this over the past few weeks. No recent fever, chills, nausea, vomiting. Does admit to some mild intermittent diarrhea.   Past Medical History  Diagnosis Date  . 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 Anxiety   . Depression   . Adenocarcinoma of cervix, stage 1 ([place])     dx  may 2015   STAGE I B2 (+pet scan pelvic nodes for mets)---  oncologist--  dr Marko Plume and dr Sondra Come--  chemo and external radiation    . GERD (gastroesophageal reflux disease)   . Radiation 10/11/13-11/23/13    whole pelvis 45 gray   Current Outpatient Prescriptions  Medication Sig Dispense Refill  . acyclovir (ZOVIRAX) 400 MG tablet Take 400 mg by mouth 2 (two) times daily.     . citalopram (CELEXA) 20 MG tablet Take 1 tablet (20 mg total) by mouth daily. 30 tablet 2  . cyclobenzaprine (FLEXERIL) 10 MG tablet TAKE 1 TABLET (10 MG  TOTAL) BY MOUTH AT BEDTIME. AS NEEDED FOR BACK MUSCLE SPASMS. 30 tablet 0  . diclofenac sodium (VOLTAREN) 1 % GEL Apply 2 g topically 4 (four) times daily. (Patient not taking: Reported on 04/28/2015) 100 g 2  . gabapentin (NEURONTIN) 600 MG tablet Take 1 tablet (600 mg total) by mouth 3 (three) times daily. (Patient not taking: Reported on 04/28/2015) 30 tablet 2  . [DISCONTINUED] norethindrone (MICRONOR,CAMILA,ERRIN) 0.35 MG tablet Take 1 tablet (0.35 mg total) by mouth daily. 1 Package 11   No current facility-administered medications for this visit.    Review of Systems: General: Positive for changes in appetite and fatigue. Denies fever, chills, diaphoresis.  Respiratory: Denies SOB, DOE, cough, and wheezing.   Cardiovascular: Denies chest pain and palpitations.  Gastrointestinal: Positive for RUQ pain and intermittent diarrhea. Denies nausea, vomiting.  Genitourinary: Denies dysuria, increased frequency, and flank pain. Endocrine: Denies hot or cold intolerance, polyuria, and polydipsia. Musculoskeletal: Denies myalgias, back pain, joint swelling, arthralgias and gait problem.  Skin: Denies pallor, rash and wounds.  Neurological: Denies dizziness, seizures, syncope, weakness, lightheadedness, numbness and headaches.  Psychiatric/Behavioral: Positive for depressed mood, anxiety, and sleep disturbance.   Objective:   Physical Exam: Filed Vitals:   05/25/15 0822  BP: 121/75  Pulse: 66  Temp: 98.1 F (36.7 C)  TempSrc: Oral  Height: 5\' 5"  (1.651 m)  Weight: 172 lb 9.6 oz (78.291 kg)  SpO2: 99%  General: White female, alert, cooperative, NAD. HEENT: PERRL, EOMI. Moist mucus membranes Neck: Full range of motion without pain, supple, no lymphadenopathy or carotid bruits Lungs: Clear to ascultation bilaterally, normal work of respiration, no wheezes, rales, rhonchi Heart: RRR, no murmurs, gallops, or rubs Abdomen: Soft, non-tender, non-distended, BS +. Murphy's sign negative.    Extremities: No cyanosis, clubbing, or edema Neurologic: Alert & oriented x3, cranial nerves II-XII intact, strength grossly intact, sensation intact to light touch   Assessment & Plan:   Please see problem based assessment and plan.

## 2015-05-29 NOTE — Progress Notes (Signed)
Internal Medicine Clinic Attending  Case discussed with Dr. Jones at the time of the visit.  We reviewed the resident's history and exam and pertinent patient test results.  I agree with the assessment, diagnosis, and plan of care documented in the resident's note.  

## 2015-06-06 ENCOUNTER — Telehealth: Payer: Self-pay

## 2015-06-06 ENCOUNTER — Encounter: Payer: Self-pay | Admitting: Gynecologic Oncology

## 2015-06-06 NOTE — Telephone Encounter (Signed)
Please schedule patient for an appointment as soon as possible. It is very important for her to be seen by a physician  at the clinic urgently. Thanks.

## 2015-06-06 NOTE — Telephone Encounter (Signed)
rec'd call from patient, she is tearful and reports she has been having "panic and anxiety attacks for a couple of months."  She states the attacks come out of nowhere and cannot report a trigger.  Describes dizziness, blurred vision, tachycardia, shortness of breath, crying, and nausea during these episodes.  Patient denies feeling suicidal/homicidal.  Pt has been taking the 20mg  of celexa prescribed during last OV 2/9.  She reports there has been no change to her mood and reports feeling the Zoloft wasn't helping at all either.    Please advise. Does she have to come in for a referral or to titrate the Celexa?   She seemed receptive to talking to someone

## 2015-06-07 NOTE — Telephone Encounter (Signed)
She has been added to schedule tomorrow with Dr. Heber [place]

## 2015-06-07 NOTE — Telephone Encounter (Signed)
Thanks

## 2015-06-08 ENCOUNTER — Encounter: Payer: Self-pay | Admitting: Internal Medicine

## 2015-06-08 ENCOUNTER — Ambulatory Visit (INDEPENDENT_AMBULATORY_CARE_PROVIDER_SITE_OTHER): Payer: Medicaid Other | Admitting: Internal Medicine

## 2015-06-08 VITALS — BP 117/71 | HR 100 | Temp 98.5°F | Ht 65.0 in | Wt 175.7 lb

## 2015-06-08 DIAGNOSIS — F331 Major depressive disorder, recurrent, moderate: Secondary | ICD-10-CM | POA: Diagnosis present

## 2015-06-08 DIAGNOSIS — F410 Panic disorder [episodic paroxysmal anxiety] without agoraphobia: Secondary | ICD-10-CM | POA: Diagnosis not present

## 2015-06-08 MED ORDER — GABAPENTIN 600 MG PO TABS: 600.0000 mg | ORAL_TABLET | Freq: Three times a day (TID) | ORAL | Status: DC

## 2015-06-08 MED ORDER — ALPRAZOLAM 0.25 MG PO TABS: 0.2500 mg | ORAL_TABLET | Freq: Three times a day (TID) | ORAL | Status: DC | PRN

## 2015-06-08 MED ORDER — CITALOPRAM HYDROBROMIDE 40 MG PO TABS: 40.0000 mg | ORAL_TABLET | Freq: Every day | ORAL | Status: DC

## 2015-06-08 NOTE — Assessment & Plan Note (Signed)
A: MDD  P: Increase Celexa to 40mg  daily Referral for CBT.

## 2015-06-08 NOTE — Progress Notes (Signed)
[facility] Subjective:   Patient ID: Karen Daniels female   DOB: 1975/05/28 40 y.o.   MRN: [id number]  HPI: Karen Daniels is a 40 y.o. female with a PMH detailed below who presents for follow up for depression.  She was seen about 2-3 weeks ago and reported that her zoloft was not effective.  This was changed to Celexa 20mg  daily.  She reports since starting that dose she has not appreciated any change in her depression, loss of interest or energy.  She feels it is not working and she thinks it has make her anxiety worse.  She is under increased stress with her daughter's cheer competitions.  She notes that at times she gets overwhelmed feels flushed and has palpitations and feels she is frozen.  She has not been able to find a specific trigger for this but thinks it happens more when she is out of the house.    Past Medical History  Diagnosis Date  . 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 Anxiety   . Depression   . Adenocarcinoma of cervix, stage 1 ([place])     dx  may 2015   STAGE I B2 (+pet scan pelvic nodes for mets)---  oncologist--  dr Marko Plume and dr Sondra Come--  chemo and external radiation    . GERD (gastroesophageal reflux disease)   . Radiation 10/11/13-11/23/13    whole pelvis 45 gray   Current Outpatient Prescriptions  Medication Sig Dispense Refill  . acyclovir (ZOVIRAX) 400 MG tablet Take 400 mg by mouth 2 (two) times daily.     Marland Kitchen ALPRAZolam (XANAX) 0.25 MG tablet Take 1 tablet (0.25 mg total) by mouth 3 (three) times daily as needed for anxiety. 60 tablet 1  . citalopram (CELEXA) 40 MG tablet Take 1 tablet (40 mg total) by mouth daily. 30 tablet 2  . cyclobenzaprine (FLEXERIL) 10 MG tablet TAKE 1 TABLET (10 MG TOTAL) BY MOUTH AT BEDTIME. AS NEEDED FOR BACK MUSCLE SPASMS. 30 tablet 0  . diclofenac sodium (VOLTAREN) 1 % GEL Apply 2 g topically 4 (four) times daily. (Patient not taking: Reported on 04/28/2015) 100 g 2  . gabapentin  (NEURONTIN) 600 MG tablet Take 1 tablet (600 mg total) by mouth 3 (three) times daily. 30 tablet 2  . [DISCONTINUED] norethindrone (MICRONOR,CAMILA,ERRIN) 0.35 MG tablet Take 1 tablet (0.35 mg total) by mouth daily. 1 Package 11   No current facility-administered medications for this visit.   Family History  Problem Relation Age of Onset  . Heart disease Father   . COPD Father   . Diabetes Father   . Hypertension Father   . Arthritis Father   . Arthritis Mother   . Breast cancer Maternal Aunt   . Diabetes Maternal Grandmother   . Cancer Maternal Grandmother     skin cancer  . Heart disease Maternal Grandmother   . Breast cancer Paternal Grandmother   . Cancer Maternal Aunt     lung   Social History   Social History  . Marital Status: Divorced    Spouse Name: N/A  . Number of Children: 2  . Years of Education: N/A   Occupational History  .     Social History Main Topics  . Smoking status: Former Smoker -- 0.50 packs/day for 2 years    Types: Cigarettes    Quit date: 08/30/2013  . Smokeless tobacco: Never Used     Comment: Quit in May 2015  . Alcohol  Use: 0.0 oz/week    0 Standard drinks or equivalent per week     Comment: occasionally  . Drug Use: No  . Sexual Activity: Yes   Other Topics Concern  . None   Social History Narrative   Review of Systems: Review of Systems  Constitutional: Negative for fever and chills.  Respiratory: Negative for cough.   Cardiovascular: Negative for chest pain.  Genitourinary: Negative for dysuria.  Psychiatric/Behavioral: Positive for depression. Negative for suicidal ideas. The patient is nervous/anxious.      Objective:  Physical Exam: Filed Vitals:   06/08/15 1502  BP: 117/71  Pulse: 100  Temp: 98.5 F (36.9 C)  TempSrc: Oral  Height: 5\' 5"  (1.651 m)  Weight: 175 lb 11.2 oz (79.697 kg)  SpO2: 98%  Physical Exam  Constitutional: She is well-developed, well-nourished, and in no distress.  Psychiatric: Affect  normal. She expresses no suicidal plans and no homicidal plans.    Assessment & Plan:  Case discussed with Dr. Beryle Beams  Recurrent major depressive disorder ([place]) A: MDD  P: Increase Celexa to 40mg  daily Referral for CBT.  Panic attacks A: Panic attacks  P: Increase celexa as above Referral for CBT Add Low dose Xanax PRN for panic attacks.    Medications Ordered Meds ordered this encounter  Medications  . citalopram (CELEXA) 40 MG tablet    Sig: Take 1 tablet (40 mg total) by mouth daily.    Dispense:  30 tablet    Refill:  2  . ALPRAZolam (XANAX) 0.25 MG tablet    Sig: Take 1 tablet (0.25 mg total) by mouth 3 (three) times daily as needed for anxiety.    Dispense:  60 tablet    Refill:  1  . gabapentin (NEURONTIN) 600 MG tablet    Sig: Take 1 tablet (600 mg total) by mouth 3 (three) times daily.    Dispense:  30 tablet    Refill:  2   Other Orders Orders Placed This Encounter  Procedures  . Ambulatory referral to Psychology    Referral Priority:  Routine    Referral Type:  Psychiatric    Referral Reason:  Specialty Services Required    Requested Specialty:  Psychology    Number of Visits Requested:  1   Follow Up: Return in about 2 months (around 08/06/2015).

## 2015-06-08 NOTE — Progress Notes (Signed)
Medicine attending: Medical history, presenting problems, physical findings, and medications, reviewed with resident physician Dr Erik Hoffman on the day of the patient visit and I concur with his evaluation and management plan. 

## 2015-06-08 NOTE — Assessment & Plan Note (Signed)
A: Panic attacks  P: Increase celexa as above Referral for CBT Add Low dose Xanax PRN for panic attacks.

## 2015-06-08 NOTE — Patient Instructions (Signed)
General Instructions:  Increase Celexa to 40mg  daily  I have referred you to the psychologist.  For you panic attack symptoms please take Xanax 0.25mg  three times a day as needed.  Please bring your medicines with you each time you come to clinic.  Medicines may include prescription medications, over-the-counter medications, herbal remedies, eye drops, vitamins, or other pills.   Progress Toward Treatment Goals:  No flowsheet data found.  Self Care Goals & Plans:  No flowsheet data found.  No flowsheet data found.   Care Management & Community Referrals:  No flowsheet data found.

## 2015-06-13 ENCOUNTER — Telehealth: Payer: Self-pay | Admitting: *Deleted

## 2015-06-13 NOTE — Telephone Encounter (Signed)
Called pt 2x today, lm for rtc

## 2015-06-14 ENCOUNTER — Telehealth: Payer: Self-pay | Admitting: *Deleted

## 2015-06-14 ENCOUNTER — Encounter: Payer: Self-pay | Admitting: Licensed Clinical Social Worker

## 2015-06-14 NOTE — Telephone Encounter (Signed)
Called pt and spoke briefly, she was at work and ask that i call and leave the info on her vmail, i did so, went over it twice on the vmail and ask her to call clinic for questions or concerns and ask for me by name.

## 2015-06-15 MED ORDER — GABAPENTIN 300 MG PO CAPS: 600.0000 mg | ORAL_CAPSULE | Freq: Three times a day (TID) | ORAL | Status: DC

## 2015-06-15 NOTE — Addendum Note (Signed)
Addended by: Lalla Brothers T on: 06/15/2015 01:30 PM   Modules accepted: Orders, Medications

## 2015-06-15 NOTE — Telephone Encounter (Signed)
Calling to get clarification about rx. gabapentin (NEURONTIN) 600 MG tablet pt insurance will not cover this but will cover the 300 mg. They want to change it

## 2015-06-15 NOTE — Telephone Encounter (Signed)
Ok. I have sent a new Rx for Gabapentin 300mg  tabs, to use 2 tabs TID. Thanks. Damita Dunnings

## 2015-06-16 ENCOUNTER — Ambulatory Visit (HOSPITAL_COMMUNITY)
Admission: RE | Admit: 2015-06-16 | Discharge: 2015-06-16 | Disposition: A | Discharge status: Home / Self Care | Payer: Medicaid Other | Source: Ambulatory Visit | Attending: Student in an Organized Health Care Education/Training Program | Admitting: Student in an Organized Health Care Education/Training Program

## 2015-06-16 DIAGNOSIS — K824 Cholesterolosis of gallbladder: Secondary | ICD-10-CM | POA: Insufficient documentation

## 2015-06-16 DIAGNOSIS — R1011 Right upper quadrant pain: Secondary | ICD-10-CM | POA: Diagnosis not present

## 2015-06-28 ENCOUNTER — Emergency Department (HOSPITAL_BASED_OUTPATIENT_CLINIC_OR_DEPARTMENT_OTHER): Payer: Medicaid Other

## 2015-06-28 ENCOUNTER — Encounter (HOSPITAL_BASED_OUTPATIENT_CLINIC_OR_DEPARTMENT_OTHER): Payer: Self-pay | Admitting: Emergency Medicine

## 2015-06-28 ENCOUNTER — Emergency Department (HOSPITAL_BASED_OUTPATIENT_CLINIC_OR_DEPARTMENT_OTHER)
Admission: EM | Admit: 2015-06-28 | Discharge: 2015-06-28 | Disposition: A | Discharge status: Home / Self Care | Payer: Medicaid Other | Attending: Emergency Medicine | Admitting: Emergency Medicine

## 2015-06-28 DIAGNOSIS — S20212A Contusion of left front wall of thorax, initial encounter: Secondary | ICD-10-CM | POA: Insufficient documentation

## 2015-06-28 DIAGNOSIS — Z923 Personal history of irradiation: Secondary | ICD-10-CM | POA: Diagnosis not present

## 2015-06-28 DIAGNOSIS — Z8719 Personal history of other diseases of the digestive system: Secondary | ICD-10-CM | POA: Diagnosis not present

## 2015-06-28 DIAGNOSIS — Z87891 Personal history of nicotine dependence: Secondary | ICD-10-CM | POA: Diagnosis not present

## 2015-06-28 DIAGNOSIS — Z79899 Other long term (current) drug therapy: Secondary | ICD-10-CM | POA: Insufficient documentation

## 2015-06-28 DIAGNOSIS — F419 Anxiety disorder, unspecified: Secondary | ICD-10-CM | POA: Diagnosis not present

## 2015-06-28 DIAGNOSIS — Y998 Other external cause status: Secondary | ICD-10-CM | POA: Diagnosis not present

## 2015-06-28 DIAGNOSIS — Y9389 Activity, other specified: Secondary | ICD-10-CM | POA: Diagnosis not present

## 2015-06-28 DIAGNOSIS — Y9289 Other specified places as the place of occurrence of the external cause: Secondary | ICD-10-CM | POA: Diagnosis not present

## 2015-06-28 DIAGNOSIS — Z8619 Personal history of other infectious and parasitic diseases: Secondary | ICD-10-CM | POA: Diagnosis not present

## 2015-06-28 DIAGNOSIS — F329 Major depressive disorder, single episode, unspecified: Secondary | ICD-10-CM | POA: Diagnosis not present

## 2015-06-28 DIAGNOSIS — Z8541 Personal history of malignant neoplasm of cervix uteri: Secondary | ICD-10-CM | POA: Diagnosis not present

## 2015-06-28 DIAGNOSIS — W010XXA Fall on same level from slipping, tripping and stumbling without subsequent striking against object, initial encounter: Secondary | ICD-10-CM | POA: Insufficient documentation

## 2015-06-28 DIAGNOSIS — S299XXA Unspecified injury of thorax, initial encounter: Secondary | ICD-10-CM | POA: Diagnosis present

## 2015-06-28 MED ORDER — LIDOCAINE 5 % EX PTCH
1.0000 | MEDICATED_PATCH | CUTANEOUS | Status: DC
Start: 2015-06-28 — End: 2015-07-26

## 2015-06-28 MED ORDER — METHOCARBAMOL 500 MG PO TABS: 500.0000 mg | ORAL_TABLET | Freq: Two times a day (BID) | ORAL | Status: DC

## 2015-06-28 MED ORDER — NAPROXEN 250 MG PO TABS
500.0000 mg | ORAL_TABLET | Freq: Once | ORAL | Status: AC
  Administered 2015-06-28: 500 mg via ORAL
  Filled 2015-06-28: qty 2

## 2015-06-28 MED ORDER — NAPROXEN 500 MG PO TABS: 500.0000 mg | ORAL_TABLET | Freq: Two times a day (BID) | ORAL | Status: DC

## 2015-06-28 MED ORDER — METHOCARBAMOL 500 MG PO TABS
1000.0000 mg | ORAL_TABLET | Freq: Once | ORAL | Status: AC
  Administered 2015-06-28: 1000 mg via ORAL
  Filled 2015-06-28: qty 2

## 2015-06-28 NOTE — ED Notes (Signed)
Patient states that she fell and hurt her left ribs on saturday

## 2015-06-28 NOTE — ED Provider Notes (Signed)
CSN: [id number]     Arrival date & time 06/28/15  2309 History  By signing my name below, I, Karen Daniels, attest that this documentation has been prepared under the direction and in the presence of [name], MD . Electronically Signed: Rowan Daniels, Scribe. 06/28/2015. 11:29 PM.   Chief Complaint  Patient presents with  . Rib Injury   Patient is a 40 y.o. female presenting with fall. The history is provided by the patient. No language interpreter was used.  Fall This is a new problem. The current episode started more than 2 days ago. The problem occurs constantly. The problem has not changed since onset.Pertinent negatives include no abdominal pain and no headaches. Nothing aggravates the symptoms. Nothing relieves the symptoms. She has tried nothing for the symptoms. The treatment provided no relief.   HPI Comments:  Karen Daniels is a 40 y.o. female with PMHx of GERD and hysterectomy who presents to the Emergency Department complaining of worsening left rib pain. Pt states she slipped on wet grass fell on Saturday, landing on left arm and ribs. She also reports bruise on left arm. No treatments attempted PTA or alleviating factors noted. Pt has no other symptoms or complaints at this time.  Past Medical History  Diagnosis Date  . 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 Anxiety   . Depression   . Adenocarcinoma of cervix, stage 1 ([place])     dx  may 2015   STAGE I B2 (+pet scan pelvic nodes for mets)---  oncologist--  dr Marko Plume and dr Sondra Come--  chemo and external radiation    . GERD (gastroesophageal reflux disease)   . Radiation 10/11/13-11/23/13    whole pelvis 45 gray   Past Surgical History  Procedure Laterality Date  . Wisdom tooth extraction    . Colposcopy    . Diagnostic laparoscopy  07-28-2000    w/ lysis adhesions  . Colonoscopy  07-11-1999  . Abdominal hysterectomy  12/24/13   Family History  Problem Relation Age of Onset  . Heart disease Father    . COPD Father   . Diabetes Father   . Hypertension Father   . Arthritis Father   . Arthritis Mother   . Breast cancer Maternal Aunt   . Diabetes Maternal Grandmother   . Cancer Maternal Grandmother     skin cancer  . Heart disease Maternal Grandmother   . Breast cancer Paternal Grandmother   . Cancer Maternal Aunt     lung   Social History  Substance Use Topics  . Smoking status: Former Smoker -- 0.50 packs/day for 2 years    Types: Cigarettes    Quit date: 08/30/2013  . Smokeless tobacco: Never Used     Comment: Quit in May 2015  . Alcohol Use: 0.0 oz/week    0 Standard drinks or equivalent per week     Comment: occasionally   OB History    Gravida Para Term Preterm AB TAB SAB Ectopic Multiple Living   4 2 2  2 1 1   2      Review of Systems  Gastrointestinal: Negative for abdominal pain.  Musculoskeletal: Positive for myalgias and arthralgias.  Skin: Positive for color change (bruise).  Neurological: Negative for headaches.  All other systems reviewed and are negative.  Allergies  Review of patient's allergies indicates no known allergies.  Home Medications   Prior to Admission medications   Medication Sig Start Date End Date Taking? Authorizing Provider  acyclovir (  ZOVIRAX) 400 MG tablet Take 400 mg by mouth 2 (two) times daily.     Historical Provider, MD  ALPRAZolam Duanne Moron) 0.25 MG tablet Take 1 tablet (0.25 mg total) by mouth 3 (three) times daily as needed for anxiety. 06/08/15 06/07/16  Lucious Groves, DO  citalopram (CELEXA) 40 MG tablet Take 1 tablet (40 mg total) by mouth daily. 06/08/15 06/07/16  Lucious Groves, DO  cyclobenzaprine (FLEXERIL) 10 MG tablet TAKE 1 TABLET (10 MG TOTAL) BY MOUTH AT BEDTIME. AS NEEDED FOR BACK MUSCLE SPASMS. 05/17/15   Shela Leff, MD  diclofenac sodium (VOLTAREN) 1 % GEL Apply 2 g topically 4 (four) times daily. Patient not taking: Reported on 04/28/2015 03/14/15   Shela Leff, MD  gabapentin (NEURONTIN) 300 MG capsule  Take 2 capsules (600 mg total) by mouth 3 (three) times daily. 06/15/15 06/14/16  Axel Filler, MD   BP 117/69 mmHg  Pulse 81  Temp(Src) 98.6 F (37 C) (Oral)  Resp 18  Ht 5\' 5"  (1.651 m)  Wt 175 lb (79.379 kg)  BMI 29.12 kg/m2  SpO2 100%  LMP 11/16/2013 Physical Exam  Constitutional: She appears well-developed and well-nourished. No distress.  HENT:  Head: Normocephalic and atraumatic.  Mouth/Throat: Oropharynx is clear and moist and mucous membranes are normal.  Eyes: EOM are normal. Pupils are equal, round, and reactive to light.  Neck: Normal range of motion. Neck supple. Carotid bruit is not present.  Cardiovascular: Normal rate, regular rhythm and normal heart sounds.   Pulmonary/Chest: Effort normal and breath sounds normal. No stridor. No respiratory distress. She has no wheezes. She has no rales.  Abdominal: Soft. Bowel sounds are normal. There is no rebound and no guarding.  Musculoskeletal: Normal range of motion.  No setpoffs of cervical thoracic or lumbar; no bruising over ribs; no crepitus, no obvious stepoffs  Neurological: She is alert. She has normal reflexes.  Skin: Skin is warm and dry.  Psychiatric: She has a normal mood and affect.    ED Course  Procedures  DIAGNOSTIC STUDIES:  Oxygen Saturation is 100% on RA, normal by my interpretation.    COORDINATION OF CARE:  11:25 PM Will administer medication and order imaging of ribs. Discussed treatment plan with pt at bedside and pt agreed to plan.  Labs Review Labs Reviewed - No data to display  Imaging Review Dg Ribs Unilateral W/chest Left  06/28/2015  CLINICAL DATA:  Left rib injury 4 days ago.  Initial encounter. EXAM: LEFT RIBS AND CHEST - 3+ VIEW COMPARISON:  09/03/2014 FINDINGS: Oblique views of the left lower ribs were obtained at the site of pain. No fracture or other bone lesions are seen involving the ribs. There is no evidence of pneumothorax or pleural effusion. Both lungs are clear. Heart  size and mediastinal contours are within normal limits. IMPRESSION: Negative. Electronically Signed   By: Monte Fantasia M.D.   On: 06/28/2015 23:48   I have personally reviewed and evaluated these images and lab results as part of my medical decision-making.   EKG Interpretation None      MDM   Final diagnoses:  Chest wall contusion, left, initial encounter   Results for orders placed or performed in visit on 03/14/15  T4, Free  Result Value Ref Range   Free T4 1.07 0.82 - 1.77 ng/dL  TSH  Result Value Ref Range   TSH 1.960 0.450 - 4.500 uIU/mL   Dg Ribs Unilateral W/chest Left  06/28/2015  CLINICAL DATA:  Left  rib injury 4 days ago.  Initial encounter. EXAM: LEFT RIBS AND CHEST - 3+ VIEW COMPARISON:  09/03/2014 FINDINGS: Oblique views of the left lower ribs were obtained at the site of pain. No fracture or other bone lesions are seen involving the ribs. There is no evidence of pneumothorax or pleural effusion. Both lungs are clear. Heart size and mediastinal contours are within normal limits. IMPRESSION: Negative. Electronically Signed   By: Monte Fantasia M.D.   On: 06/28/2015 23:48   US Abdomen Limited Ruq  06/16/2015  CLINICAL DATA:  Right upper quadrant pain for 1 month EXAM: US ABDOMEN LIMITED - RIGHT UPPER QUADRANT COMPARISON:  None. FINDINGS: Gallbladder: Well distended without evidence of cholelithiasis. A non mobile echogenic focus is noted extending from the gallbladder wall consistent with a small gallbladder polyp. It measures approximately 3 mm. Common bile duct: Diameter: 3.5 mm. Liver: No focal lesion identified. Within normal limits in parenchymal echogenicity. IMPRESSION: Small gallbladder polyp.  No other focal abnormality is noted. Electronically Signed   By: Inez Catalina M.D.   On: 06/16/2015 08:29    Medications  methocarbamol (ROBAXIN) tablet 1,000 mg (1,000 mg Oral Given 06/28/15 2337)  naproxen (NAPROSYN) tablet 500 mg (500 mg Oral Given 06/28/15 2337)      Will treat with NSAIDs muscle relaxants lidoderm patches and an incentive spirometer.  Follow up with your PMD for ongoing care  I personally performed the services described in this documentation, which was scribed in my presence. The recorded information has been reviewed and is accurate.      Veatrice Kells, MD 06/29/15 0001

## 2015-06-28 NOTE — ED Notes (Signed)
Patient transported to X-ray 

## 2015-06-28 NOTE — Discharge Instructions (Signed)
Chest Contusion °A contusion is a deep bruise. Bruises happen when an injury causes bleeding under the skin. Signs of bruising include pain, puffiness (swelling), and discolored skin. The bruise may turn blue, purple, or yellow.  °HOME CARE °· Put ice on the injured area. °¨ Put ice in a plastic bag. °¨ Place a towel between the skin and the bag. °¨ Leave the ice on for 15-20 minutes at a time, 03-04 times a day for the first 48 hours. °· Only take medicine as told by your doctor. °· Rest. °· Take deep breaths (deep-breathing exercises) as told by your doctor. °· Stop smoking if you smoke. °· Do not lift objects over 5 pounds (2.3 kilograms) for 3 days or longer if told by your doctor. °GET HELP RIGHT AWAY IF:  °· You have more bruising or puffiness. °· You have pain that gets worse. °· You have trouble breathing. °· You are dizzy, weak, or pass out (faint). °· You have blood in your pee (urine) or poop (stool). °· You cough up or throw up (vomit) blood. °· Your puffiness or pain is not helped with medicines. °MAKE SURE YOU:  °· Understand these instructions. °· Will watch your condition. °· Will get help right away if you are not doing well or get worse. °  °This information is not intended to replace advice given to you by your health care provider. Make sure you discuss any questions you have with your health care provider. °  °Document Released: 09/18/2007 Document Revised: 12/25/2011 Document Reviewed: 09/23/2011 °Elsevier Interactive Patient Education ©2016 [employer] ° °

## 2015-07-10 ENCOUNTER — Encounter: Payer: Self-pay | Admitting: *Deleted

## 2015-07-26 ENCOUNTER — Other Ambulatory Visit (HOSPITAL_BASED_OUTPATIENT_CLINIC_OR_DEPARTMENT_OTHER): Payer: Medicaid Other

## 2015-07-26 ENCOUNTER — Telehealth: Payer: Self-pay

## 2015-07-26 ENCOUNTER — Ambulatory Visit: Payer: Medicaid Other | Attending: Gynecologic Oncology | Admitting: Gynecologic Oncology

## 2015-07-26 ENCOUNTER — Encounter: Payer: Self-pay | Admitting: Gynecologic Oncology

## 2015-07-26 VITALS — BP 122/68 | HR 80 | Temp 98.0°F | Resp 18 | Ht 65.0 in | Wt 176.5 lb

## 2015-07-26 DIAGNOSIS — Z90710 Acquired absence of both cervix and uterus: Secondary | ICD-10-CM | POA: Diagnosis not present

## 2015-07-26 DIAGNOSIS — Z79899 Other long term (current) drug therapy: Secondary | ICD-10-CM | POA: Diagnosis not present

## 2015-07-26 DIAGNOSIS — Z87891 Personal history of nicotine dependence: Secondary | ICD-10-CM | POA: Insufficient documentation

## 2015-07-26 DIAGNOSIS — F329 Major depressive disorder, single episode, unspecified: Secondary | ICD-10-CM | POA: Insufficient documentation

## 2015-07-26 DIAGNOSIS — N390 Urinary tract infection, site not specified: Secondary | ICD-10-CM

## 2015-07-26 DIAGNOSIS — R300 Dysuria: Secondary | ICD-10-CM

## 2015-07-26 DIAGNOSIS — F419 Anxiety disorder, unspecified: Secondary | ICD-10-CM | POA: Diagnosis not present

## 2015-07-26 DIAGNOSIS — C531 Malignant neoplasm of exocervix: Secondary | ICD-10-CM | POA: Diagnosis not present

## 2015-07-26 LAB — URINALYSIS, MICROSCOPIC - CHCC
Bilirubin (Urine): NEGATIVE
GLUCOSE UR CHCC: NEGATIVE mg/dL
KETONES: NEGATIVE mg/dL
Nitrite: POSITIVE
PROTEIN: 30 mg/dL
SPECIFIC GRAVITY, URINE: 1.015 (ref 1.003–1.035)
UROBILINOGEN UR: 0.2 mg/dL (ref 0.2–1)
pH: 6.5 (ref 4.6–8.0)

## 2015-07-26 MED ORDER — SULFAMETHOXAZOLE-TRIMETHOPRIM 800-160 MG PO TABS: 1.0000 | ORAL_TABLET | Freq: Two times a day (BID) | ORAL | Status: AC

## 2015-07-26 NOTE — Patient Instructions (Signed)
We will call you with the results of your urine sample from today.  Plan to follow up with appointments at [facility].

## 2015-07-26 NOTE — Progress Notes (Signed)
GYNECOLOGIC ONCOLOGY RETURN VISIT    ASSESSMENT AND PLAN: Karen Daniels 40 y.o. 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 She had a large persistent tumor with chemorads and underwent a hysterectomy and bilateral LND. She was dispositioned to vaginal brachytherapy and Carbo/Taxol.  She is s/p 3 additional cycles of carboplatin which she completed in 11/15 as well as completing her vaginal brachytherapy.   She had a negative CT scan in June 2016 and we will repeat this year. I'm not sure if she has a little radiation cystitis or she might have a small kidney stone. We'll check her urinalysis. I will call her with results and determine disposition pending it. She has no back tenderness in looks very nontoxic today.  CC:  Chief Complaint  Patient presents with  . Follow-up    HISTORY: Karen Daniels is a 40 y.o. year old woman who presented with a history of Stage IB2 adenocarcinoma of the cervix with the following tumor history:                              Given her poor response to chemoradiation, with a persistent 5cm friable cervical mass, the decision was made to proceed with completion hysterectomy.   Procedure: 12/24/13 - Abdominal modified radical hysterectomy, bilateral salpingo-oophorectomy, bilateral pelvic lymphadenectomy.  Intraoperative Findings: There was an exophytic 5-6 cm cervical tumor with no evidence of parametrial invasion or obvious metastatic disease. The tubes and ovaries appeared normal bilaterally. The appendix appeared normal.   Pathology: Accession #: [id number]  Diagnosis: A: Uterus, cervix, bilateral tubes and ovaries, parametria, radical hysterectomy and upper vaginectomy   Histologic type: Mucinous adenocarcinoma   Histologic grade: well differentiated (see comment)  Tumor focality/site: (quadrant(s) of cervix) circumferential   Tumor size: (greatest dimension) 5.0 cm   Extent of  invasion:  Depth: 1.2 cm Wall thickness: 1.8 cm Percent: 71%  Lymphovascular space invasion: present   Parametrial soft tissue involvement: negative   Vaginal cuff involvement: positive for invasive adenocarcinoma from approximately 8 to 10 o'clock   Surgical margins:  Invasive: vaginal cuff margins positive from 8 to 10 o'clock  Intraepithelial: not applicable, none seen   Regional lymph nodes (see specimens B and C): Total number involved: 0  Total number examined: 8  Additional pathologic findings:  Endometrium: atrophy Myometrium: small benign leiomyomata  Right ovary: atrophy  Right fallopian tube: no significant pathologic abnormality, no neoplasm identified Left ovary: atrophy  Left fallopian tube: no significant pathologic abnormality, no neoplasm identified   B: Lymph nodes, right pelvic, regional resection  - No evidence of metastatic disease in five lymph nodes (0/5)   C: Lymph nodes, left pelvic, regional resection  - No evidence of metastatic disease in three lymph nodes (0/3)   Stage/Disposition: Karen Daniels is a 40 y.o. 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 Disposition to vaginal brachytherapy and consideration for further treatment with Carbo/Taxol after completing radiation treatment.   She had a CT scan 03/29/14 that revealed: IMPRESSION: 1. Mild stranding and trace free fluid in the pelvis and presacral space as well as mild rectosigmoid colitis and cystitis are likely postradiation and post surgical changes.   2. No residual cervical mass identified.   3. No evidence of metastatic disease although sensitivity is decreased due to inflammatory changes. Recommend attention on follow up.   CT 6/16: IMPRESSION: 1. Postsurgical and postradiation changes in the pelvis  without definite CT evidence of recurrent or metastatic disease.   IMPRESSION 8/16 CT for SOB: -- No acute pulmonary embolus. -- No other acute process  identified within the chest or upper abdomen.  Pap smear 2/17 was also negative.    She comes in today complaining of some left lower quadrant pain since Friday. It is intermittent does not wake her up at night. It has not required any pain medication. She describes it to 6 out of 10 that she looks quite comfortable today. She is complaining of a little bit of spotting from her bladder. She notices it when she voids in the commode as well as a little bit of pink with wiping. She denies any nausea or vomiting. She denies any back pain. There is no dysuria. She was sexually active a few weeks ago but not since. She is scheduled for CT scan of the abdomen and pelvis on May 12 with a visit with Dr. Tomasa Rand the same day. She is unable dry skin, sore throat as well as weight gain. She did have thyroid function tests done approximate 4 months ago with her primary care physician that were unremarkable.   Past Medical History:   Past Medical History    Past Medical History  Diagnosis Date  . Cervical cancer (RAF-HCC) 08/2013  . Anxiety   . Depression   . Genital HSV   . PONV (postoperative nausea and vomiting)      Past Surgical History:   Past Surgical History    Past Surgical History  Procedure Laterality Date  . Wisdom tooth extraction    . Diagnostic laparoscopy      With lysis of adhesions  . Pr radical abd hysterec+pelv nodes Bilateral 12/24/2013    Procedure: RADICAL ABDOMINAL HYSTER, W/BIL TOTAL PELVIC LYMPHADENECTOMY & PARA-AORTIC LYMPH NODE BX W/WO REM TUBE/OVAR; Surgeon: Sherol Dade, MD; Location: MAIN OR [facility]; Service: Gynecology Oncology     Oncology History:  Oncology History   Clinical Stage IB2 adenocarcinoma of the cervix with metastasis to pelvic lymph nodes.     Cervical cancer (RAF-HCC)   08/29/2013 Initial Diagnosis Presented to ED with vaginal discharge. Found to have large friable mass, biopsy showed  adenocarcinoma of cervix, no LVSI.   09/07/2013 -  Cancer Staged Saw Dr. Alycia Rossetti in [place]. Exam showed 8cm mass fillign vagina, no parametrial or sidewall involvement - clinical IB2. PET showed positive pelvic LN.   10/11/2013 - 11/23/2013 Radiation EBRT, total of 45 cGy to whole pelvis and additional 9 cGy to PET positive nodes. Suboptimal response, not candidate for HDR.   10/11/2013 - 11/08/2013 Chemotherapy Sensitizing cisplatin 40 mg/m2 x5 cycles. Cycle 6 held due to heavy vaginal bleeding on 11/09/13, requiring admission to hospital and vaginal packing and transfusion of 3U pRBCs. Exam 11/15/13 showed persistent 5cm bulky, friable mass.   12/24/2013 Surgery Abdominal modified radical hysterectomy, bilateral salping-oophorectomy, bilateral pelvic lymphadenectomy.   - 02/22/2014 Radiation boost vaginal brachytherapy   01/24/2014 - 03/14/2014 Chemotherapy 3 cycles paclitaxel and carboplatin   Family History:   Family History    Family History  Problem Relation Age of Onset  . Heart disease Father   . COPD Father   . Diabetes Father   . Hypertension Father   . Arthritis Father   . Arthritis Mother   . Cancer Maternal Aunt     Breast  . Cancer Maternal Grandmother     Skin  . Cancer Paternal Grandmother     Breast  . Cancer  Maternal Aunt     Lung  . Diabetes Maternal Grandmother   . Heart disease Maternal Grandmother      Social History:   Social History    Social History   Social History  . Marital Status: Divorced    Spouse Name: N/A  . Number of Children: N/A  . Years of Education: N/A   Social History Main Topics  . Smoking status: Former Smoker -- 0.50 packs/day for 2 years    Types: Cigarettes    Quit date: 08/30/2013  . Smokeless tobacco: Never Used  . Alcohol Use: 0.0 oz/week    0 Standard drinks or equivalent, 0 Glasses of wine, 0 Cans of  beer, 0 Shots of liquor per week     Comment: Rare  . Drug Use: No  . Sexual Activity: Yes   Other Topics Concern  . None   Social History Narrative     Allergy: No Known Allergies  CURRENT MEDICATIONS:  Encounter Medications    Outpatient Encounter Prescriptions as of 05/22/2015  Medication Sig Dispense Refill  . acyclovir (ZOVIRAX) 400 MG tablet Take 1 tablet (400 mg total) by mouth daily at 10am. 90 tablet 4  . cyclobenzaprine (FLEXERIL) 10 MG tablet Take 10 mg by mouth nightly as needed. Hasn't started yet    . diclofenac sodium (VOLTAREN) 1 % gel Apply 2 g topically.    . gabapentin (NEURONTIN) 300 MG capsule Take 300 mg by mouth Three (3) times a day.    . sertraline (ZOLOFT) 100 MG tablet Take 100 mg by mouth.    . estrogens, conjugated, (PREMARIN) 0.625 MG tablet Take 1 tablet (0.625 mg total) by mouth daily. Take daily for 21 days then do not take for 7 days. 30 tablet 11  . [DISCONTINUED] conjugated estrogens (PREMARIN) 0.625 mg/gram vaginal cream Insert 0.5 g into the vagina Two (2) times a week. 4 g 11  . [DISCONTINUED] LYRICA 50 mg capsule Take 300 mg by mouth Three (3) times a day.  0   No facility-administered encounter medications on file as of 05/22/2015.      PHYSICAL EXAM:  Abdomen: Well healed surgical incision, soft, NT/NT. No palpable masses, no groin adenopathy.   Pelvic: EGBUS wnl, vagina s/p radiation, vaginal cuff without lesions or discharge. Appearance of post radiation changes with telengectasia. Not friable,  No masses, rectal confirms, no nodularity. Thickening of the tissues consistent with radiation, no nodularity.  LABORATORY AND RADIOLOGIC STUDIES: Urinalysis and urine culture

## 2015-07-26 NOTE — Telephone Encounter (Signed)
Orders received from [place] to contact the patient to update with results from urinalysis being positive for "UTI" . Recommendations : Bactrim DS twice daily for seven days. Patient contacted and updated with results UTI and clarified pharmacy . Patient instructed to take Bactrim DS two times daily for seven days for UTI . Patient states understanding , denies further questions at this time .

## 2015-07-27 ENCOUNTER — Encounter: Payer: Medicaid Other | Admitting: Internal Medicine

## 2015-07-28 LAB — URINE CULTURE

## 2015-08-21 ENCOUNTER — Telehealth: Payer: Self-pay | Admitting: Internal Medicine

## 2015-08-21 NOTE — Telephone Encounter (Signed)
APT. REMINDER CALL, LMTCB °

## 2015-08-22 ENCOUNTER — Encounter: Payer: Medicaid Other | Admitting: Internal Medicine

## 2015-10-31 ENCOUNTER — Ambulatory Visit (INDEPENDENT_AMBULATORY_CARE_PROVIDER_SITE_OTHER): Payer: Medicaid Other | Admitting: Internal Medicine

## 2015-10-31 ENCOUNTER — Encounter: Payer: Self-pay | Admitting: Internal Medicine

## 2015-10-31 VITALS — BP 114/69 | HR 80 | Temp 98.1°F | Ht 65.0 in | Wt 176.4 lb

## 2015-10-31 DIAGNOSIS — D649 Anemia, unspecified: Secondary | ICD-10-CM | POA: Diagnosis not present

## 2015-10-31 DIAGNOSIS — F331 Major depressive disorder, recurrent, moderate: Secondary | ICD-10-CM

## 2015-10-31 DIAGNOSIS — M25522 Pain in left elbow: Secondary | ICD-10-CM | POA: Diagnosis not present

## 2015-10-31 DIAGNOSIS — F3340 Major depressive disorder, recurrent, in remission, unspecified: Secondary | ICD-10-CM | POA: Diagnosis not present

## 2015-10-31 DIAGNOSIS — J029 Acute pharyngitis, unspecified: Secondary | ICD-10-CM | POA: Diagnosis present

## 2015-10-31 DIAGNOSIS — F410 Panic disorder [episodic paroxysmal anxiety] without agoraphobia: Secondary | ICD-10-CM

## 2015-10-31 NOTE — Patient Instructions (Signed)
Return for a follow-up visit in 6 months.  If you notice any changes in your mood, please contact the clinic or come in for an appointment sooner.

## 2015-11-01 LAB — ANEMIA PANEL
FERRITIN: 74 ng/mL (ref 15–150)
FOLATE, RBC: 1174 ng/mL (ref >498)
Folate, Hemolysate: 459.2 ng/mL
Hematocrit: 39.1 % (ref 34.0–46.6)
IRON SATURATION: 28 % (ref 15–55)
Iron: 75 ug/dL (ref 27–159)
Retic Ct Pct: 1.7 % (ref 0.6–2.6)
Total Iron Binding Capacity: 269 ug/dL (ref 250–450)
UIBC: 194 ug/dL (ref 131–425)
VITAMIN B 12: 101 pg/mL — AB (ref 211–946)

## 2015-11-02 DIAGNOSIS — J029 Acute pharyngitis, unspecified: Secondary | ICD-10-CM | POA: Insufficient documentation

## 2015-11-02 DIAGNOSIS — M25522 Pain in left elbow: Secondary | ICD-10-CM | POA: Insufficient documentation

## 2015-11-02 MED ORDER — VITAMIN B-12 1000 MCG PO TABS: 1000.0000 ug | ORAL_TABLET | Freq: Every day | ORAL | Status: DC

## 2015-11-02 NOTE — Progress Notes (Signed)
Patient ID: Karen Daniels, female   DOB: 09-24-1975, 40 y.o.   MRN: [id number]   CC: sore throat   HPI:  Karen Daniels is a 40 y.o. F with a PMHx of conditions listed below presenting to the clinic with a 4 day history of sore throat and one day history of dry cough. Denies having any fevers, chills, rhinorrhea, sneezing, or SOB. She is also complaining of pain in her left elbow for the past 3 weeks. States she lifts weights at the gym and could have possibly strained muscles. States the muscles around her left elbow hurt when she tries to grab objects. Denies any falls or direct trauma to the area.  Please see problem based charting for the status of the patient's chronic medical conditions.   Past Medical History  Diagnosis Date  . 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 Anxiety   . Depression   . Adenocarcinoma of cervix, stage 1 ([place])     dx  may 2015   STAGE I B2 (+pet scan pelvic nodes for mets)---  oncologist--  dr Marko Plume and dr Sondra Come--  chemo and external radiation    . GERD (gastroesophageal reflux disease)   . Radiation 10/11/13-11/23/13    whole pelvis 45 gray    Review of Systems:  Pertinent positives mentioned in HPI. All other ROS negative.   Physical Exam:  Filed Vitals:   10/31/15 1448  BP: 114/69  Pulse: 80  Temp: 98.1 F (36.7 C)  TempSrc: Oral  Height: 5\' 5"  (1.651 m)  Weight: 176 lb 6.4 oz (80.015 kg)  SpO2: 99%   Physical Exam  Constitutional: She is oriented to person, place, and time. She appears well-developed and well-nourished. No distress.  HENT:  Head: Normocephalic and atraumatic.  Mouth/Throat: No oropharyngeal exudate.  Oropharynx is mildly erythematous   Eyes: EOM are normal. Pupils are equal, round, and reactive to light.  Neck: Neck supple.  Cardiovascular: Normal rate, regular rhythm and intact distal pulses.  Exam reveals no gallop and no friction rub.   No murmur heard. Pulmonary/Chest: Effort normal and breath sounds  normal. No respiratory distress. She has no wheezes. She has no rales.  Abdominal: Soft. Bowel sounds are normal. She exhibits no distension. There is no tenderness. There is no guarding.  Musculoskeletal: She exhibits no edema.  L elbow has normal ROM. Grip strength is normal.   Lymphadenopathy:    She has no cervical adenopathy.  Neurological: She is alert and oriented to person, place, and time.  Skin: Skin is warm and dry.    Assessment & Plan:   See encounters tab for problem based medical decision making.   Patient discussed with Dr. Lynnae January

## 2015-11-02 NOTE — Assessment & Plan Note (Signed)
A Presenting with a 3 week history of left elbow pain, likely secondary to muscle strain. Elbow has normal ROM and grip strength normal on exam.   P -Voltaren gel 4 times daily (patient states she already has this medication at home) -Heating pad -Advised to stop engaging in weight lifting/ strenuous exercise temporarily

## 2015-11-02 NOTE — Assessment & Plan Note (Signed)
A Presenting with a 4 day history of sore throat and dry cough. No associated fever, chills, SOB, rhinorrhea or sneezing. Oropharynx is mildly erythematous on exam; no exudate. Sore throat could likely be secondary to viral pharyngitis.   P -Symptomatic treatment: gargle with warm water plus salt  -Advised patient to contact the clinic if her symptoms do not resolve in the next 3-4 days or she feels worse

## 2015-11-02 NOTE — Assessment & Plan Note (Signed)
A Patient states she stopped taking Celexa 4 months ago because her mood, sleep, and energy level have been good. Denies feeling anxious. Denies any SI. She was previously given a referral for CBT but states she decided not to go.   P -CTM

## 2015-11-02 NOTE — Assessment & Plan Note (Addendum)
A Labs from 08/2014 showing a low Hgb (10.6). Patient is asymptomatic at present. Labs at this visit showing normal iron and saturation ratio. B12 is low at 101.   P -B12 supplement

## 2015-11-03 NOTE — Progress Notes (Signed)
Internal Medicine Clinic Attending  Case discussed with Dr. Rathoreat the time of the visit. We reviewed the resident's history and exam and pertinent patient test results. I agree with the assessment, diagnosis, and plan of care documented in the resident's note.  

## 2015-11-16 ENCOUNTER — Telehealth: Payer: Self-pay | Admitting: Internal Medicine

## 2015-11-16 NOTE — Telephone Encounter (Signed)
Pt in Hospital in [facility].  [facility] requesting  A Referral to Dr. Joya Salm @ [facility] Neurosurgery.  Please call the patient back at her cell number listed in Epic and Advise.

## 2015-11-18 ENCOUNTER — Emergency Department (HOSPITAL_COMMUNITY)
Admission: EM | Admit: 2015-11-18 | Discharge: 2015-11-19 | Disposition: A | Discharge status: Home / Self Care | Payer: No Typology Code available for payment source | Attending: Emergency Medicine | Admitting: Emergency Medicine

## 2015-11-18 ENCOUNTER — Encounter (HOSPITAL_COMMUNITY): Payer: Self-pay | Admitting: Emergency Medicine

## 2015-11-18 DIAGNOSIS — Z87891 Personal history of nicotine dependence: Secondary | ICD-10-CM | POA: Insufficient documentation

## 2015-11-18 DIAGNOSIS — Z8541 Personal history of malignant neoplasm of cervix uteri: Secondary | ICD-10-CM | POA: Insufficient documentation

## 2015-11-18 DIAGNOSIS — S22081S Stable burst fracture of T11-T12 vertebra, sequela: Secondary | ICD-10-CM | POA: Insufficient documentation
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
## 2015-11-19 ENCOUNTER — Emergency Department (HOSPITAL_COMMUNITY): Payer: No Typology Code available for payment source

## 2015-11-19 LAB — I-STAT BETA HCG BLOOD, ED (MC, WL, AP ONLY): I-stat hCG, quantitative: <5 m[IU]/mL (ref <5)

## 2015-11-19 LAB — CBC WITH DIFFERENTIAL/PLATELET
BASOS ABS: 0.1 10*3/uL (ref 0.0–0.1)
Basophils Relative: 1 %
EOS ABS: 0.3 10*3/uL (ref 0.0–0.7)
Eosinophils Relative: 3 %
HCT: 33.4 % — ABNORMAL LOW (ref 36.0–46.0)
HEMOGLOBIN: 11.1 g/dL — AB (ref 12.0–15.0)
LYMPHS ABS: 1.7 10*3/uL (ref 0.7–4.0)
LYMPHS PCT: 21 %
MCH: 29.8 pg (ref 26.0–34.0)
MCHC: 33.2 g/dL (ref 30.0–36.0)
MCV: 89.8 fL (ref 78.0–100.0)
MONO ABS: 0.7 10*3/uL (ref 0.1–1.0)
MONOS PCT: 9 %
Neutro Abs: 5.5 10*3/uL (ref 1.7–7.7)
Neutrophils Relative %: 66 %
Platelets: 363 10*3/uL (ref 150–400)
RBC: 3.72 MIL/uL — ABNORMAL LOW (ref 3.87–5.11)
RDW: 12.4 % (ref 11.5–15.5)
WBC: 8.3 10*3/uL (ref 4.0–10.5)

## 2015-11-19 LAB — BASIC METABOLIC PANEL
ANION GAP: 10 (ref 5–15)
BUN: 8 mg/dL (ref 6–20)
CO2: 30 mmol/L (ref 22–32)
Calcium: 9.7 mg/dL (ref 8.9–10.3)
Chloride: 95 mmol/L — ABNORMAL LOW (ref 101–111)
Creatinine, Ser: 0.82 mg/dL (ref 0.44–1.00)
GLUCOSE: 107 mg/dL — AB (ref 65–99)
POTASSIUM: 3.4 mmol/L — AB (ref 3.5–5.1)
Sodium: 135 mmol/L (ref 135–145)

## 2015-11-19 MED ORDER — KETOROLAC TROMETHAMINE 30 MG/ML IJ SOLN
30.0000 mg | Freq: Once | INTRAMUSCULAR | Status: AC
  Administered 2015-11-19: 30 mg via INTRAVENOUS
  Filled 2015-11-19: qty 1

## 2015-11-19 MED ORDER — MORPHINE SULFATE (PF) 4 MG/ML IV SOLN
4.0000 mg | Freq: Once | INTRAVENOUS | Status: AC
  Administered 2015-11-19: 4 mg via INTRAVENOUS
  Filled 2015-11-19: qty 1

## 2015-11-19 NOTE — ED Notes (Signed)
Mother en route to pick up patient.
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
## 2015-11-21 ENCOUNTER — Encounter: Payer: Self-pay | Admitting: Internal Medicine

## 2015-11-21 ENCOUNTER — Ambulatory Visit: Payer: Medicaid Other

## 2015-11-22 ENCOUNTER — Encounter (HOSPITAL_COMMUNITY): Payer: Self-pay | Admitting: Emergency Medicine

## 2015-11-22 ENCOUNTER — Emergency Department (HOSPITAL_COMMUNITY)
Admission: EM | Admit: 2015-11-22 | Discharge: 2015-11-22 | Disposition: A | Discharge status: Home / Self Care | Payer: Medicaid Other | Attending: Emergency Medicine | Admitting: Emergency Medicine

## 2015-11-22 DIAGNOSIS — M549 Dorsalgia, unspecified: Secondary | ICD-10-CM | POA: Diagnosis present

## 2015-11-22 DIAGNOSIS — M546 Pain in thoracic spine: Secondary | ICD-10-CM | POA: Diagnosis not present

## 2015-11-22 DIAGNOSIS — Z79899 Other long term (current) drug therapy: Secondary | ICD-10-CM | POA: Diagnosis not present

## 2015-11-22 DIAGNOSIS — Z8541 Personal history of malignant neoplasm of cervix uteri: Secondary | ICD-10-CM | POA: Insufficient documentation

## 2015-11-22 DIAGNOSIS — M5489 Other dorsalgia: Secondary | ICD-10-CM

## 2015-11-22 DIAGNOSIS — Z87891 Personal history of nicotine dependence: Secondary | ICD-10-CM | POA: Insufficient documentation

## 2015-11-22 MED ORDER — DIAZEPAM 5 MG PO TABS: 5.0000 mg | ORAL_TABLET | Freq: Two times a day (BID) | ORAL | 0 refills | Status: DC

## 2015-11-22 MED ORDER — OXYCODONE HCL 5 MG PO TABS: 5.0000 mg | ORAL_TABLET | ORAL | 0 refills | Status: DC | PRN

## 2015-11-22 NOTE — Discharge Instructions (Signed)
Continue taking her pain medications as prescribed. Continue wearing her back brace until follow-up with neurosurgery. Return to the emergency department if symptoms worsen or fever, numbness, tingling, saddle anesthesia, loss of bowel or bladder, weakness.
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
## 2015-11-23 ENCOUNTER — Telehealth: Payer: Self-pay | Admitting: *Deleted

## 2015-12-11 ENCOUNTER — Ambulatory Visit: Payer: Medicaid Other | Attending: Neurological Surgery | Admitting: Physical Therapy

## 2015-12-11 ENCOUNTER — Ambulatory Visit: Payer: Medicaid Other | Admitting: Rehabilitative and Restorative Service Providers"

## 2015-12-11 DIAGNOSIS — M545 Low back pain, unspecified: Secondary | ICD-10-CM

## 2015-12-11 DIAGNOSIS — M6281 Muscle weakness (generalized): Secondary | ICD-10-CM | POA: Diagnosis present

## 2015-12-11 IMAGING — CR DG CHEST 2V
2 series · 2 of 2 positions shown · non-contrast
Comparison: None.

CLINICAL DATA: GENERALIZED BODY ACHES CHEST PAIN VAGINAL DISCHARGE

EXAM:
CHEST  2 VIEW

[w chest pa]
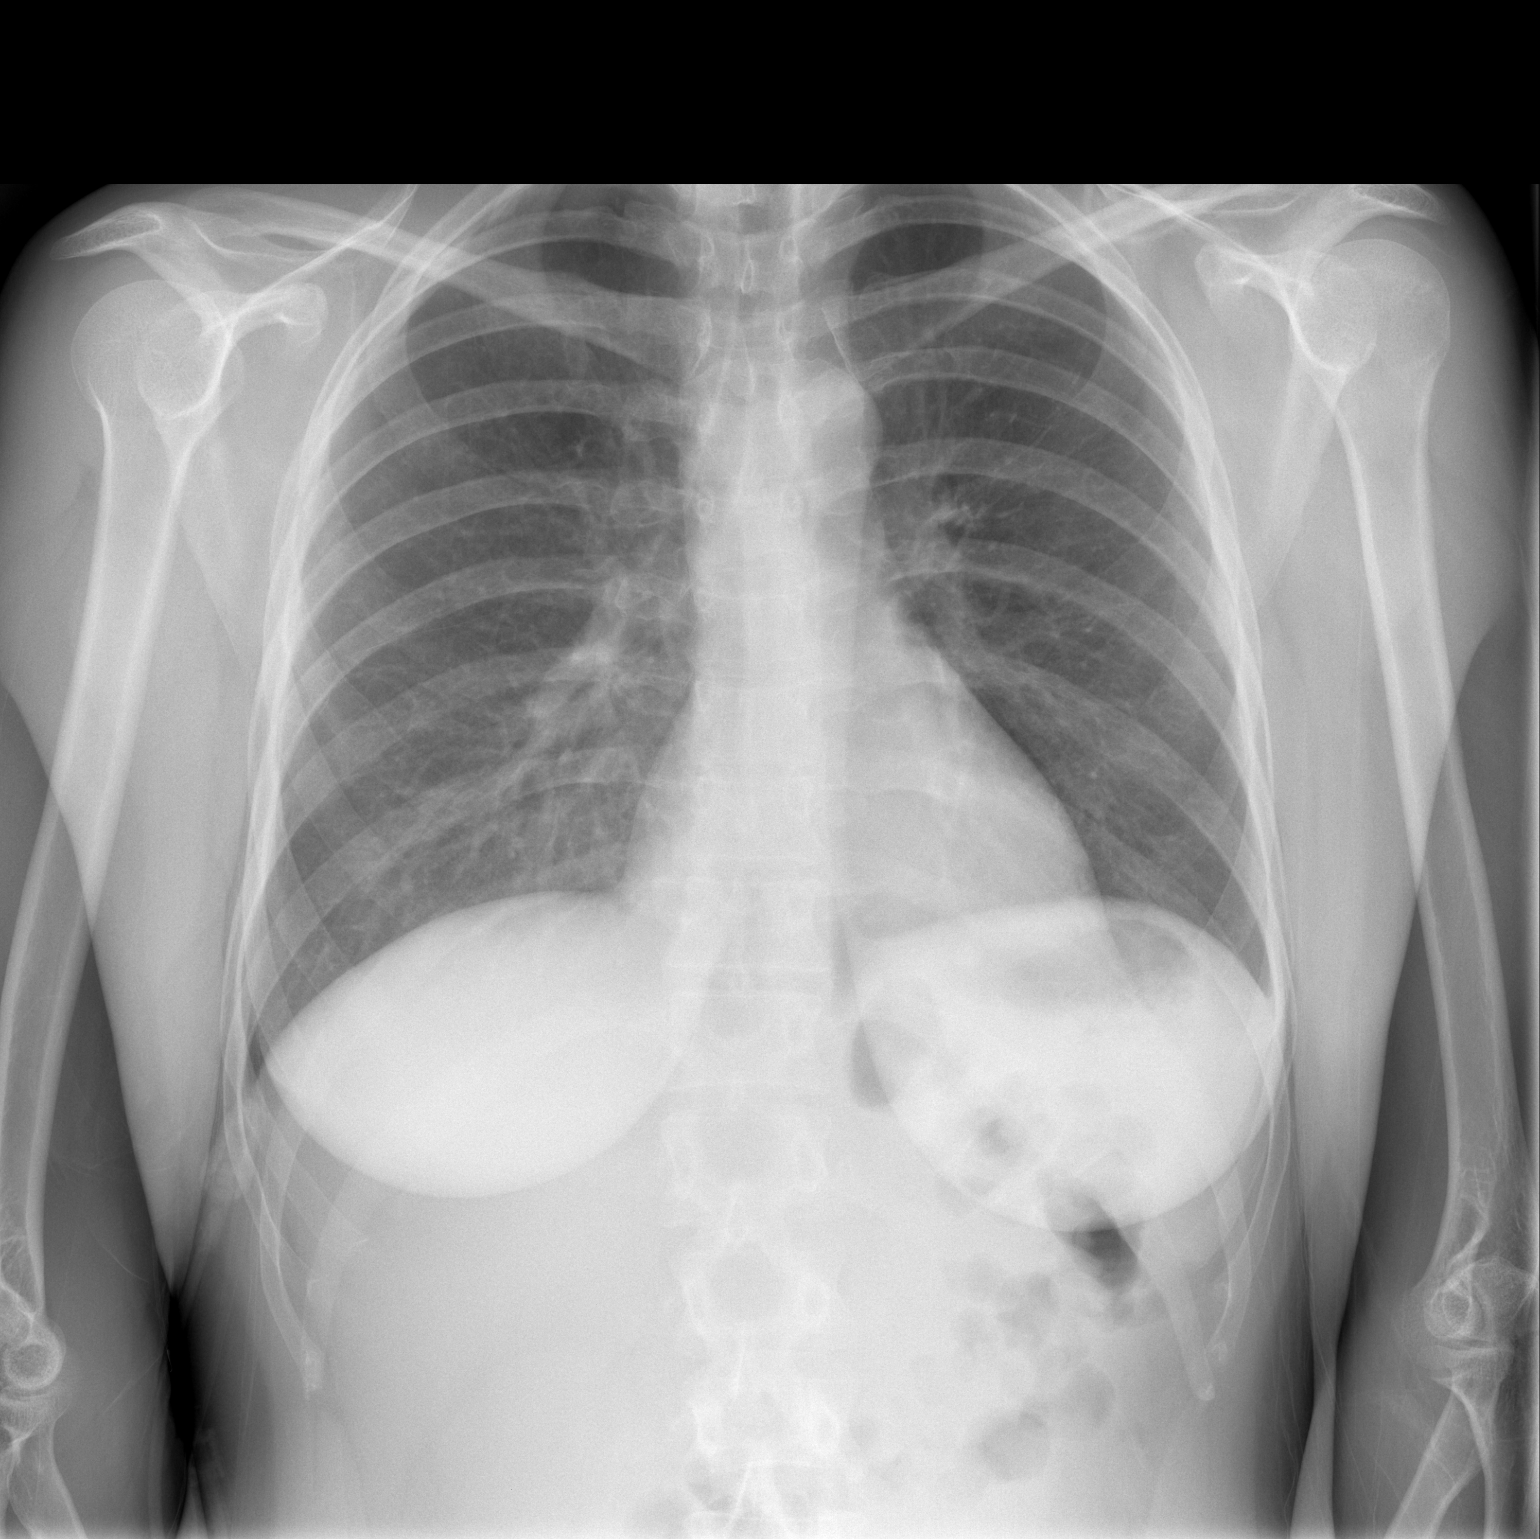

[w chest lat]
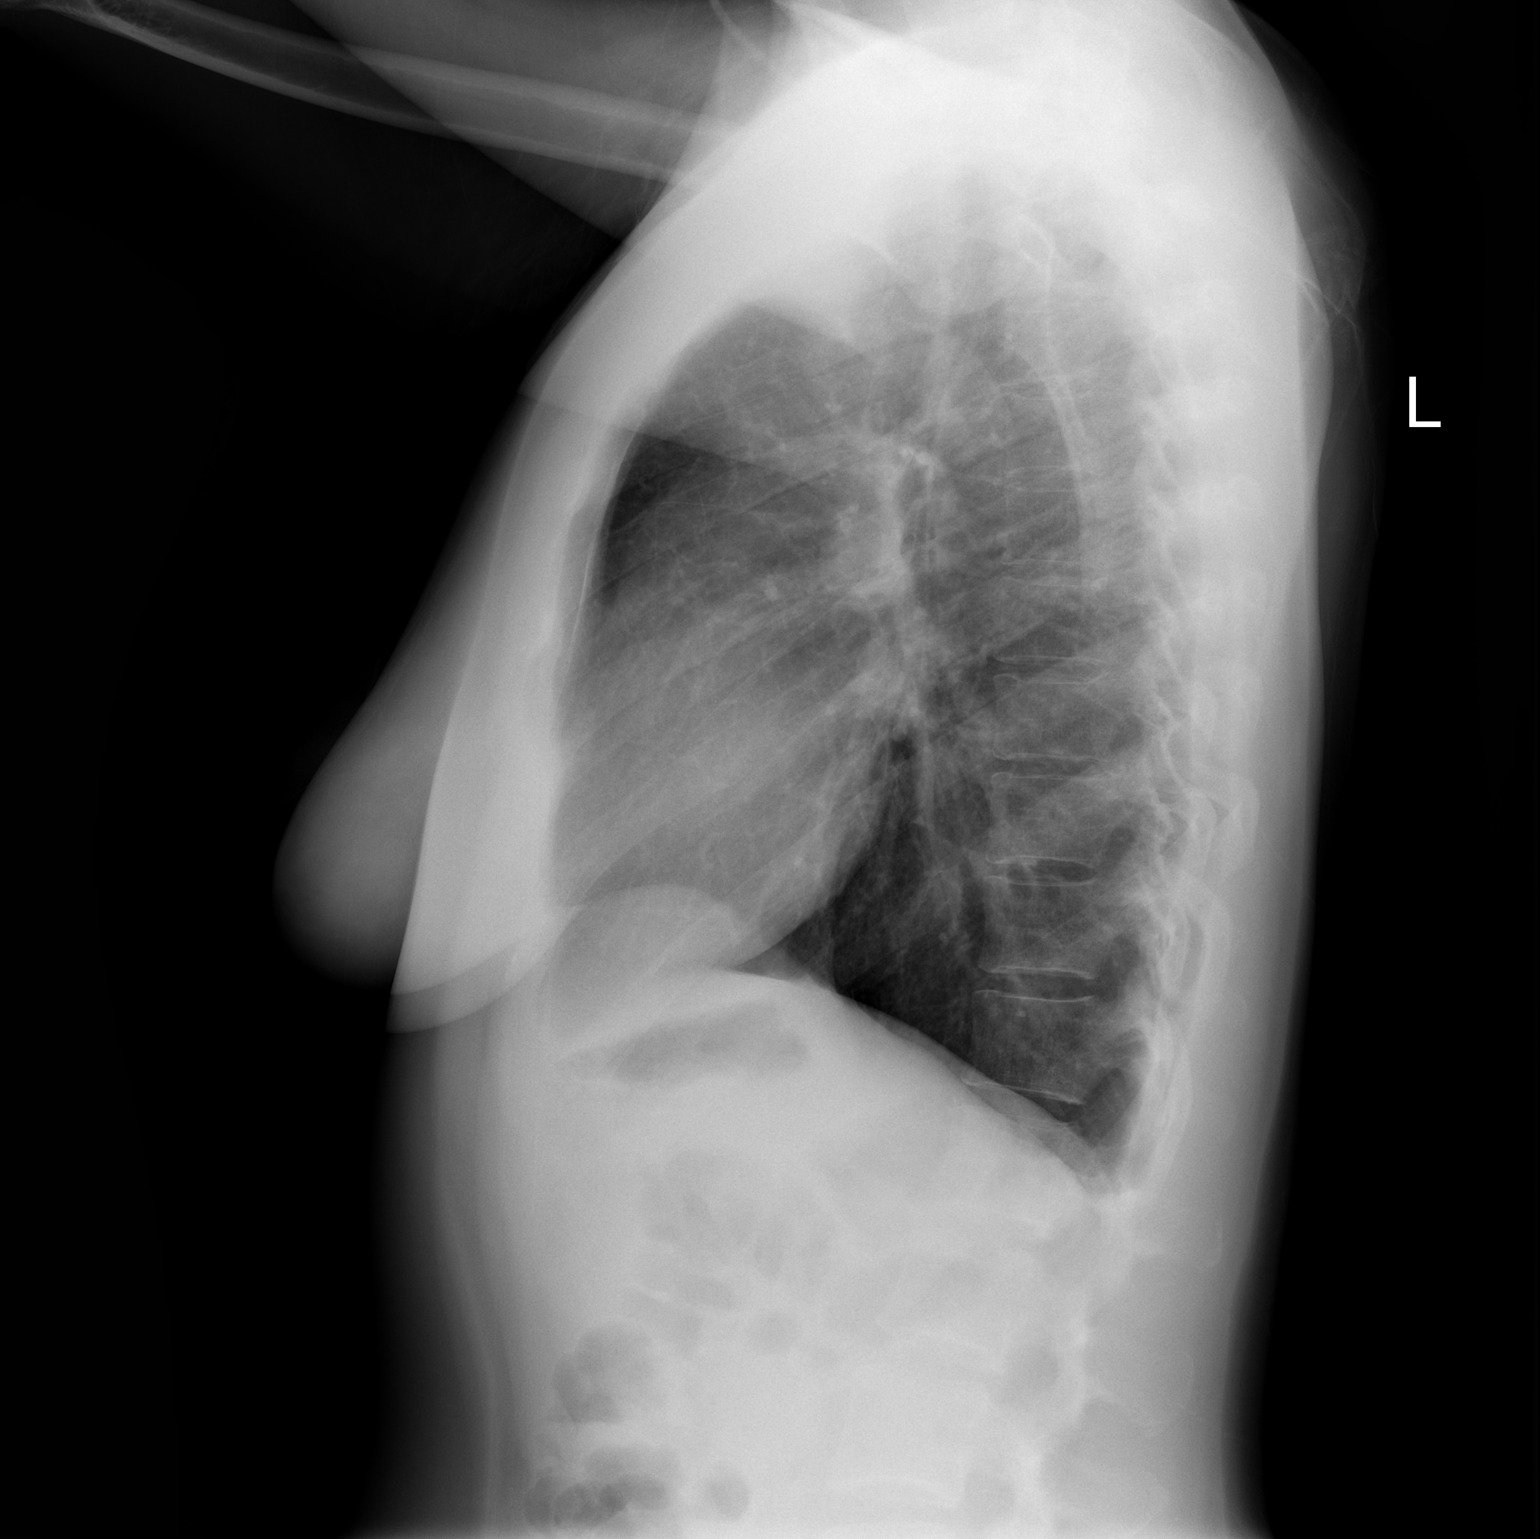

[2 of 2 positions shown; findings below may reference images not displayed]

FINDINGS: The heart size and mediastinal contours are within normal limits.
Both lungs are clear. The visualized skeletal structures are
unremarkable.
IMPRESSION: No active cardiopulmonary disease.

## 2015-12-11 NOTE — Patient Instructions (Addendum)
Functional Quadriceps: Sit to Stand    Sit on edge of chair, feet flat on floor. Stand upright, extending knees fully. Repeat __10-15__ times per set. Do _1-2___ sets per session. Do __1-3__ sessions per day.  http://orth.exer.us/735   Copyright  VHI. All rights reserved.   Standing Marching   Using a chair if necessary, march in place. Repeat __10-15__ times. Do __1-3__ sessions per day.  http://gt2.exer.us/344   Copyright  VHI. All rights reserved.   Hip Backward Kick   Using a chair for balance, keep legs shoulder width apart and toes pointed for- ward. Slowly extend one leg back, keeping knee straight. Do not lean forward. Repeat with other leg. Repeat __10-15__ times. Do _1-3___ sessions per day.  http://gt2.exer.us/340   Copyright  VHI. All rights reserved.   Hip Side Kick   Holding a chair for balance, keep legs shoulder width apart and toes pointed forward. Swing a leg out to side, keeping knee straight. Do not lean. Repeat using other leg. Repeat __10-15__ times. Do __1-3__ sessions per day.  http://gt2.exer.us/342   Copyright  VHI. All rights reserved.   ADDUCTION: Standing - Stable (Active)   Stand, right leg out to side as far as possible. Draw leg in across midline. Repeat with other leg. Complete __1_ sets of _10-15__ repetitions. Perform _1-3__ sessions per day.  http://gtsc.exer.us/144   Copyright  VHI. All rights reserved.    Heel Raise: Bilateral (Standing)    Rise on balls of feet. Repeat __10-15__ times per set. Do __1__ sets per session. Do _1-3___ sessions per day.  http://orth.exer.us/39   Copyright  VHI. All rights reserved.

## 2015-12-12 ENCOUNTER — Encounter: Payer: Medicaid Other | Admitting: Internal Medicine
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
## 2015-12-13 ENCOUNTER — Other Ambulatory Visit: Payer: Self-pay | Admitting: Gynecologic Oncology

## 2015-12-13 ENCOUNTER — Ambulatory Visit: Payer: Medicaid Other | Attending: Gynecologic Oncology | Admitting: Gynecologic Oncology

## 2015-12-13 ENCOUNTER — Encounter: Payer: Self-pay | Admitting: Gynecologic Oncology

## 2015-12-13 VITALS — BP 118/78 | HR 93 | Temp 98.5°F | Resp 18 | Ht 65.0 in | Wt 169.5 lb

## 2015-12-13 DIAGNOSIS — Z8261 Family history of arthritis: Secondary | ICD-10-CM | POA: Diagnosis not present

## 2015-12-13 DIAGNOSIS — F418 Other specified anxiety disorders: Secondary | ICD-10-CM | POA: Insufficient documentation

## 2015-12-13 DIAGNOSIS — Z923 Personal history of irradiation: Secondary | ICD-10-CM | POA: Diagnosis not present

## 2015-12-13 DIAGNOSIS — Z801 Family history of malignant neoplasm of trachea, bronchus and lung: Secondary | ICD-10-CM | POA: Insufficient documentation

## 2015-12-13 DIAGNOSIS — Z833 Family history of diabetes mellitus: Secondary | ICD-10-CM | POA: Insufficient documentation

## 2015-12-13 DIAGNOSIS — Z803 Family history of malignant neoplasm of breast: Secondary | ICD-10-CM | POA: Diagnosis not present

## 2015-12-13 DIAGNOSIS — C531 Malignant neoplasm of exocervix: Secondary | ICD-10-CM

## 2015-12-13 DIAGNOSIS — Z8619 Personal history of other infectious and parasitic diseases: Secondary | ICD-10-CM | POA: Insufficient documentation

## 2015-12-13 DIAGNOSIS — Z808 Family history of malignant neoplasm of other organs or systems: Secondary | ICD-10-CM | POA: Insufficient documentation

## 2015-12-13 DIAGNOSIS — Z87891 Personal history of nicotine dependence: Secondary | ICD-10-CM | POA: Diagnosis not present

## 2015-12-13 DIAGNOSIS — C539 Malignant neoplasm of cervix uteri, unspecified: Secondary | ICD-10-CM | POA: Diagnosis not present

## 2015-12-13 DIAGNOSIS — Z90710 Acquired absence of both cervix and uterus: Secondary | ICD-10-CM | POA: Diagnosis not present

## 2015-12-13 DIAGNOSIS — Z9221 Personal history of antineoplastic chemotherapy: Secondary | ICD-10-CM | POA: Insufficient documentation

## 2015-12-13 DIAGNOSIS — K625 Hemorrhage of anus and rectum: Secondary | ICD-10-CM

## 2015-12-13 DIAGNOSIS — Z8249 Family history of ischemic heart disease and other diseases of the circulatory system: Secondary | ICD-10-CM | POA: Insufficient documentation

## 2015-12-13 MED ORDER — ACYCLOVIR 400 MG PO TABS: 400.0000 mg | ORAL_TABLET | Freq: Two times a day (BID) | ORAL | 2 refills | Status: DC

## 2015-12-13 NOTE — Patient Instructions (Signed)
A referral will be placed for a GI consultation for possible colonoscopy, we will also contact you with the results from your PAP test.  Follow up with Dr Nancy Marus in 3 months , your appointment has been scheduled . Your CT scan is scheduled as well , call with any changes, questions or concerns.  Thank you !
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
## 2015-12-15 ENCOUNTER — Telehealth: Payer: Self-pay | Admitting: Internal Medicine

## 2015-12-15 ENCOUNTER — Other Ambulatory Visit: Payer: Self-pay | Admitting: Internal Medicine

## 2015-12-15 DIAGNOSIS — Z120 Encounter for screening for malignant neoplasm of stomach: Secondary | ICD-10-CM

## 2015-12-15 NOTE — Telephone Encounter (Signed)
APT. REMINDER CALL, NO ANSWER, NO VOICEMAIL °

## 2015-12-19 ENCOUNTER — Encounter (INDEPENDENT_AMBULATORY_CARE_PROVIDER_SITE_OTHER): Payer: Medicaid Other | Admitting: Internal Medicine

## 2015-12-20 ENCOUNTER — Ambulatory Visit (HOSPITAL_COMMUNITY): Payer: No Typology Code available for payment source

## 2015-12-21 ENCOUNTER — Ambulatory Visit (HOSPITAL_COMMUNITY)
Admission: RE | Admit: 2015-12-21 | Discharge: 2015-12-21 | Disposition: A | Discharge status: Home / Self Care | Payer: Medicaid Other | Source: Ambulatory Visit | Attending: Gynecologic Oncology | Admitting: Gynecologic Oncology

## 2015-12-21 ENCOUNTER — Telehealth: Payer: Self-pay

## 2015-12-21 DIAGNOSIS — Z90710 Acquired absence of both cervix and uterus: Secondary | ICD-10-CM | POA: Insufficient documentation

## 2015-12-21 DIAGNOSIS — C531 Malignant neoplasm of exocervix: Secondary | ICD-10-CM | POA: Insufficient documentation

## 2015-12-21 DIAGNOSIS — K639 Disease of intestine, unspecified: Secondary | ICD-10-CM | POA: Insufficient documentation

## 2015-12-21 DIAGNOSIS — M438X4 Other specified deforming dorsopathies, thoracic region: Secondary | ICD-10-CM | POA: Insufficient documentation

## 2015-12-21 DIAGNOSIS — Z9689 Presence of other specified functional implants: Secondary | ICD-10-CM | POA: Insufficient documentation

## 2015-12-21 LAB — CYTOLOGY - PAP

## 2015-12-21 MED ORDER — IOPAMIDOL (ISOVUE-300) INJECTION 61%
100.0000 mL | Freq: Once | INTRAVENOUS | Status: AC | PRN
  Administered 2015-12-21: 100 mL via INTRAVENOUS

## 2015-12-21 MED ORDER — IOPAMIDOL (ISOVUE-300) INJECTION 61%
30.0000 mL | Freq: Once | INTRAVENOUS | Status: DC | PRN
  Administered 2015-12-21: 30 mL via ORAL
  Filled 2015-12-21: qty 30

## 2015-12-21 NOTE — Telephone Encounter (Signed)
Orders received from [facility] to contact the patient to update with results from PAP , "normal" showing benign radiation changes. CT scan , No evidence of recurrent or metastatic disease. Shows same radiation changes to the bowel . Patient is to follow up with gastroenterologist as discussed during her visit with Dr Nancy Marus. Attempted to contact the patient , no answer , left a detailed message with call back information if additional questions arise. Melissa Cross, APNP aware.

## 2016-01-02 IMAGING — CT NM PET TUM IMG INITIAL (PI) SKULL BASE T - THIGH
1 of 8 series · 1 of 25 positions shown · non-contrast
Comparison: Pelvic ultrasound 08/29/2013.

CLINICAL DATA: Initial treatment strategy for cervical carcinoma..

EXAM:
NUCLEAR MEDICINE PET SKULL BASE TO THIGH
TECHNIQUE: 8.8 mCi F-18 FDG was injected intravenously. Full-ring PET imaging
was performed from the skull base to thigh after the radiotracer. CT
data was obtained and used for attenuation correction and anatomic
localization.
FASTING BLOOD GLUCOSE:  Value: 80 mg/dl

[Series 4: ct sk_thigh 5.0 b31f · axial · 5.0mm · 0.89mm/px · 1 of 217 slices shown]
[im 217/217  brain]
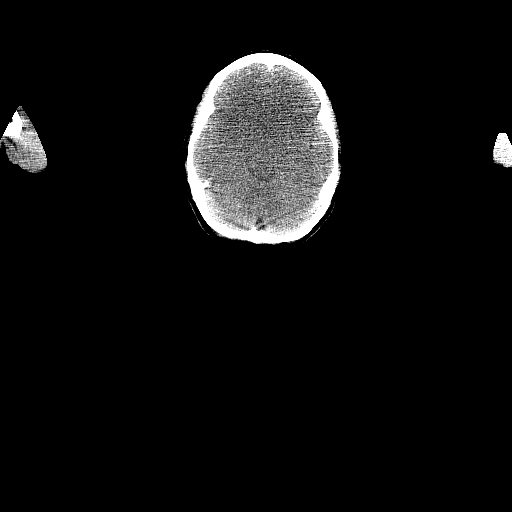

[1 of 25 positions shown; findings below may reference images not displayed]

FINDINGS: NECK

No hypermetabolic lymph nodes in the neck.

CHEST

No hypermetabolic mediastinal or hilar nodes. No suspicious
pulmonary nodules on the CT scan. No acute consolidative airspace
disease. No pleural effusions. Heart size is normal.

ABDOMEN/PELVIS

There is a large soft tissue mass in the low pelvis centered in the
region of the cervix. This mass is difficult to discretely measure,
but is estimated to be up to approximately 7.3 x 6.9 cm in greatest
transaxial dimensions, and is diffusely hypermetabolic. This exerts
mass effect on adjacent structures, and grossly distorting the upper
vagina, coming in close proximity to the base of the urinary
bladder, and intimately associated with the anterior wall of the
distal rectum. Several mildly enlarged and borderline enlarged lymph
nodes are noted along the pelvic sidewall, predominantly internal
iliac in distribution. The largest of these on the right side
measures up to 1 cm (image 166 of series 4) and is hypermetabolic
(SUVmax = 3.5), while the largest left-sided lymph node measures up
to 1 cm in short axis (image 162 of series 4) and is also mildly
hypermetabolic (SUVmax = 2.6). Borderline enlarged right inguinal
lymph node (image 175 of series 4) measures 8 mm in short axis, but
only demonstrates some low-level metabolism (SUVmax = 2.1).

No abnormal hypermetabolic activity within the liver, pancreas,
adrenal glands, or spleen. Normal appendix. No definite
hypermetabolic retroperitoneal lymphadenopathy.

SKELETON

No focal hypermetabolic activity to suggest skeletal metastasis.
IMPRESSION: 1. Large hypermetabolic mass in the region of the cervix with
associated bilateral pelvic lymphadenopathy (predominantly in the
internal iliac nodal chains), as detailed above, compatible with the
reported diagnosis of cervical carcinoma with pelvic nodal
metastasis.
2. Borderline enlarged right inguinal lymph node demonstrates some
low-level metabolic activity. This is nonspecific, and may be
reactive, although the presence of a right inguinal nodal metastasis
is not excluded.
3. No other signs of distant metastatic disease involving the neck,
chest or abdomen.

## 2016-01-05 ENCOUNTER — Telehealth: Payer: Self-pay

## 2016-01-05 NOTE — Telephone Encounter (Signed)
Patient was completed her consultation with Dr Collene Mares on 01/04/16. Melissa Cross , APNP aware .

## 2016-01-08 ENCOUNTER — Ambulatory Visit: Payer: Medicaid Other | Attending: Neurological Surgery | Admitting: Physical Therapy

## 2016-01-08 DIAGNOSIS — M545 Low back pain, unspecified: Secondary | ICD-10-CM

## 2016-01-08 DIAGNOSIS — M6281 Muscle weakness (generalized): Secondary | ICD-10-CM | POA: Diagnosis present

## 2016-01-08 NOTE — Patient Instructions (Addendum)
   For previous exercises: Add 3 lbs ankle weights and increase reps to 2-3 sets of 10.   At the gym:  [place]  1. Seated Stepper  2. Recumbent Bike  3. Indoor Walking Hydrographic surveyor (recommend low weight, light resistance; 2-3 sets of 10 reps)  1. Knee Extension  2. Hamstring Curl  3. Leg Press  4. Hip Abduction/Adduction  5. Row  (All exercises here were 15-20 lbs; so start there and adjust as you need to - each machine is different and the weights may feel a little different than in the clinic)   PELVIC STABILIZATION: Pelvic Tilt (Lying)    Exhaling, pull belly toward spine, tilting pelvis forward. Hold 5 seconds. Repeat __10-15_ times. Do _2-3__ times per day.  Copyright  VHI. All rights reserved.

## 2016-01-08 NOTE — Therapy (Signed)
[facility] [address]  [place], [place], [address] Phone: [phone]   Fax:  [phone]  Physical Therapy Treatment  Patient Details  Name: Karen Daniels MRN: [id number] Date of Birth: 29-May-1975 Referring Provider: Dr. Marland Kitchen Ditty  Encounter Date: 01/08/2016      PT End of Session - 01/08/16 1033    Visit Number 2   Number of Visits 4   Date for PT Re-Evaluation 04/12/16   Authorization Type Medicaid   Authorization Time Period 01/02/16-12/18-17   Authorization - Visit Number 1   Authorization - Number of Visits 3   PT Start Time 0940  pt arrived late   PT Stop Time 1033   PT Time Calculation (min) 53 min   Activity Tolerance Patient tolerated treatment well   Behavior During Therapy [facility] for tasks assessed/performed      Past Medical History:  Diagnosis Date  . Adenocarcinoma of cervix, stage 1 ([place])    dx  may 2015   STAGE I B2 (+pet scan pelvic nodes for mets)---  oncologist--  dr Marko Plume and dr Sondra Come--  chemo and external radiation    . Anxiety   . Depression   . GERD (gastroesophageal reflux disease)   . 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 Radiation 10/11/13-11/23/13   whole pelvis 45 gray    Past Surgical History:  Procedure Laterality Date  . ABDOMINAL HYSTERECTOMY  12/24/13  . BACK SURGERY    . COLONOSCOPY  07-11-1999  . COLPOSCOPY    . DIAGNOSTIC LAPAROSCOPY  07-28-2000   w/ lysis adhesions  . WISDOM TOOTH EXTRACTION      There were no vitals filed for this visit.      Subjective Assessment - 01/08/16 0942    Subjective back in her apartment and only having a little bit of pain with stairs.  goes back to MD next week   Currently in Pain? Yes   Pain Score 3    Pain Location Back   Pain Orientation Lower   Pain Descriptors / Indicators Dull;Sharp   Pain Type Surgical pain   Pain Onset 1 to 4 weeks ago   Pain Frequency Intermittent   Aggravating Factors  at  night, stairs   Pain Relieving Factors meds                         [facility] Adult PT Treatment/Exercise - 01/08/16 0953      Exercises   Exercises Lumbar     Lumbar Exercises: Machines for Strengthening   Cybex Knee Flexion 20# 3x10   Cybex Leg Press 20# 3x10     Lumbar Exercises: Supine   Ab Set 10 reps;5 seconds     Knee/Hip Exercises: Aerobic   Nustep L6 x 8 min     Knee/Hip Exercises: Machines for Strengthening   Total Gym Leg Press 20# 3x10   Other Machine BATCA row 15# x15 each grip     Knee/Hip Exercises: Standing   Heel Raises Both;15 reps   Hip Flexion Both;15 reps;Knee bent   Hip Flexion Limitations 3#   Hip ADduction Both;15 reps   Hip ADduction Limitations 3#   Hip Abduction Both;15 reps;Knee straight   Abduction Limitations 3#   Hip Extension Both;15 reps;Knee straight   Extension Limitations 3#     Knee/Hip Exercises: Seated   Sit to Sand 15 reps;without UE support  PT Education - 01/08/16 1032    Education provided Yes   Education Details progressed HEP; community fitness exercises   Person(s) Educated Patient   Methods Explanation;Demonstration;Handout   Comprehension Verbalized understanding;Returned demonstration          PT Short Term Goals - 01/08/16 1034      PT SHORT TERM GOAL #1   Title independent with HEP (01/08/16)   Status Achieved     PT SHORT TERM GOAL #2   Title report ability to sit > 30 min without increase in pain in order to drive or ride with children to activities (01/08/16)   Status Achieved           PT Long Term Goals - 12/12/15 0813      PT LONG TERM GOAL #1   Title report pain < 2/10 for improved activity tolerance and function (04/12/16)   Time 4   Period Months   Status New     PT LONG TERM GOAL #2   Title report ability to stand for > 15 min without increase in pain for improved function and ADLs (04/12/16)   Time 4   Period Months   Status New     PT LONG TERM  GOAL #3   Title demonstrate LE strength of at least 4+/5 for improved function and activity tolerance (04/12/16)   Time 4   Period Months   Status New     PT LONG TERM GOAL #4   Title perform lumbar ROM within limitations as directed by neurosurgeon without increase in pain (04/12/16)   Time 4   Period Months   Status New               Plan - 01/08/16 1035    Clinical Impression Statement Pt tolerated session well without increase in pain and able to progress home exercises.  Initiated gym program with light resistance and core strengthening.  Will continue to benefit from PT to maximize function.   PT Next Visit Plan core strengthening, assess activity tolerance; resisted gait exercies; LE strengthening   Consulted and Agree with Plan of Care Patient      Patient will benefit from skilled therapeutic intervention in order to improve the following deficits and impairments:     Visit Diagnosis: Midline low back pain without sciatica  Muscle weakness (generalized)     Problem List Patient Active Problem List   Diagnosis Date Noted  . Left elbow pain 11/02/2015  . Sore throat 11/02/2015  . Panic attacks 06/08/2015  . RUQ abdominal pain 05/25/2015  . Recurrent major depressive disorder ([place]) 03/15/2015  . Muscle spasm of back 02/03/2015  . 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 Anemia 03/24/2014  . GERD (gastroesophageal reflux disease)   . Cervical cancer ([place]) 09/07/2013  . Former smoker 08/07/2011       Laureen Abrahams, PT, DPT 01/08/16 10:40 AM    [facility] [address]  [place], [place], [address] Phone: [phone]   Fax:  [phone]  Name: Karen Daniels MRN: [id number] Date of Birth: Aug 22, 1975

## 2016-01-22 ENCOUNTER — Telehealth: Payer: Self-pay | Admitting: Internal Medicine

## 2016-01-22 NOTE — Telephone Encounter (Signed)
APT. REMINDER CALL, LMTCB °

## 2016-01-23 ENCOUNTER — Encounter: Payer: Medicaid Other | Admitting: Internal Medicine

## 2016-02-12 ENCOUNTER — Ambulatory Visit: Payer: Medicaid Other | Attending: Neurological Surgery | Admitting: Physical Therapy

## 2016-02-12 DIAGNOSIS — M6281 Muscle weakness (generalized): Secondary | ICD-10-CM | POA: Diagnosis present

## 2016-02-12 DIAGNOSIS — M545 Low back pain, unspecified: Secondary | ICD-10-CM

## 2016-02-12 NOTE — Therapy (Addendum)
[facility] [address]  [place], [place], [address] Phone: [phone]   Fax:  [phone]  Physical Therapy Treatment  Patient Details  Name: Karen Daniels MRN: [id number] Date of Birth: December 31, 1975 Referring Provider: Dr. Marland Kitchen Ditty  Encounter Date: 02/12/2016      PT End of Session - 02/12/16 1052    Visit Number 3   Number of Visits 4   Date for PT Re-Evaluation 04/12/16   Authorization Type Medicaid   Authorization Time Period 01/02/16-04/01/16   Authorization - Visit Number 2   Authorization - Number of Visits 3   PT Start Time 0931   PT Stop Time 1050   PT Time Calculation (min) 79 min   Activity Tolerance Patient tolerated treatment well   Behavior During Therapy [facility] for tasks assessed/performed      Past Medical History:  Diagnosis Date  . Adenocarcinoma of cervix, stage 1 ([place])    dx  may 2015   STAGE I B2 (+pet scan pelvic nodes for mets)---  oncologist--  dr Marko Plume and dr Sondra Come--  chemo and external radiation    . Anxiety   . Depression   . GERD (gastroesophageal reflux disease)   . 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 Radiation 10/11/13-11/23/13   whole pelvis 45 gray    Past Surgical History:  Procedure Laterality Date  . ABDOMINAL HYSTERECTOMY  12/24/13  . BACK SURGERY    . COLONOSCOPY  07-11-1999  . COLPOSCOPY    . DIAGNOSTIC LAPAROSCOPY  07-28-2000   w/ lysis adhesions  . WISDOM TOOTH EXTRACTION      There were no vitals filed for this visit.      Subjective Assessment - 02/12/16 0935    Subjective only in LSO now. overall nothing new to report.  back is "alright."   Pertinent History cervical cancer (in remission), hysterectomy   How long can you sit comfortably? 15 min   Patient Stated Goals improve ROM, drive again                         [facility] Adult PT Treatment/Exercise - 02/12/16 0945      Lumbar Exercises: Aerobic   Elliptical  L2.5 x 6 min     Lumbar Exercises: Standing   Functional Squats 10 reps;5 seconds   Functional Squats Limitations TRX   Scapular Retraction Both;10 reps   Scapular Retraction Limitations TRX     Lumbar Exercises: Seated   Long Arc Quad on [employer] Both;10 reps   LAQ on [employer] (lbs) 5   Hip Flexion on Ball Both;10 reps   Hip Flexion on Ball Limitations 5#     Lumbar Exercises: Supine   Ab Set 10 reps;5 seconds   Clam 10 reps   Heel Slides 10 reps   Bent Knee Raise 10 reps   Bridge 10 reps;5 seconds   Other Supine Lumbar Exercises hamstring curls, bridging, and bridging with hamstring curls on green physioball x 10 each     Lumbar Exercises: Prone   Other Prone Lumbar Exercises plank 5x15 sec     Knee/Hip Exercises: Aerobic   Nustep L8x10 min                  PT Short Term Goals - 01/08/16 1034      PT SHORT TERM GOAL #1   Title independent with HEP (01/08/16)   Status Achieved  PT SHORT TERM GOAL #2   Title report ability to sit > 30 min without increase in pain in order to drive or ride with children to activities (01/08/16)   Status Achieved           PT Long Term Goals - 12/12/15 0813      PT LONG TERM GOAL #1   Title report pain < 2/10 for improved activity tolerance and function (04/12/16)   Time 4   Period Months   Status New     PT LONG TERM GOAL #2   Title report ability to stand for > 15 min without increase in pain for improved function and ADLs (04/12/16)   Time 4   Period Months   Status New     PT LONG TERM GOAL #3   Title demonstrate LE strength of at least 4+/5 for improved function and activity tolerance (04/12/16)   Time 4   Period Months   Status New     PT LONG TERM GOAL #4   Title perform lumbar ROM within limitations as directed by neurosurgeon without increase in pain (04/12/16)   Time 4   Period Months   Status New               Plan - 02/12/16 1053    Clinical Impression Statement Pt now in LSO only  and is progressing well with PT and gym program.  Anticipate d/c next session due to insurance limitations and hopefully meeting all goals. Core strengthening progressed without increase in pain.   Rehab Potential Good   PT Treatment/Interventions ADLs/Self Care Home Management;Cryotherapy;Electrical Stimulation;Moist Heat;Therapeutic exercise;Therapeutic activities;Balance training;Functional mobility training;Stair training;Gait training;Patient/family education;Scar mobilization   PT Next Visit Plan check goals, d/c, provide any additional HEP   Consulted and Agree with Plan of Care Patient      Patient will benefit from skilled therapeutic intervention in order to improve the following deficits and impairments:  Decreased activity tolerance, Decreased endurance, Pain, Decreased strength, Decreased mobility, Difficulty walking  Visit Diagnosis: Midline low back pain without sciatica, unspecified chronicity  Muscle weakness (generalized)     Problem List Patient Active Problem List   Diagnosis Date Noted  . Left elbow pain 11/02/2015  . Sore throat 11/02/2015  . Panic attacks 06/08/2015  . RUQ abdominal pain 05/25/2015  . Recurrent major depressive disorder ([place]) 03/15/2015  . Muscle spasm of back 02/03/2015  . Chemotherapy-induced peripheral neuropathy ([place]) 01/18/2015  . Anemia 03/24/2014  . GERD (gastroesophageal reflux disease)   . Cervical cancer ([place]) 09/07/2013  . Former smoker 08/07/2011      Laureen Abrahams, PT, DPT 02/12/16 10:57 AM    [facility] [address]  [place], [place], [address] Phone: [phone]   Fax:  [phone]  Name: JAONNA WORD MRN: [id number] Date of Birth: [birth year]     PHYSICAL THERAPY DISCHARGE SUMMARY  Visits from Start of Care: 3  Current functional level related to goals / functional outcomes: See above   Remaining deficits: unknown   Education /  Equipment: HEP  Plan: Patient agrees to discharge.  Patient goals were not met. Patient is being discharged due to not returning since the last visit.  ?????     Laureen Abrahams, PT, DPT 03/27/16 2:06 PM   Outpatient Rehab at [facility] [place], [place] [address]  [phone] (office) [phone] (fax)

## 2016-02-12 NOTE — Patient Instructions (Signed)
  PELVIC TILT    Lie with hips and knees bent. Slowly inhale, and then exhale. Pull navel toward spine and tighten pelvic floor. Hold for __10_ seconds. Continue to breathe in and out during hold. Rest for _10__ seconds. Repeat __10_ times. Do __2-3_ times a day.   Copyright  VHI. All rights reserved.   Knee Fold   Lie on back, legs bent, arms by sides. Exhale, lifting knee to chest. Inhale, returning. Keep abdominals flat, navel to spine. Repeat __10__ times, alternating legs. Do __2__ sessions per day.  Copyright  VHI. All rights reserved.   Knee Drop   Keep pelvis stable. Without rotating hips, slowly drop knee to side, pause, return to center, bring knee across midline toward opposite hip. Feel obliques engaging. Repeat for ___10_ times each leg.   Copyright  VHI. All rights reserved.    Heel Slide to Straight   Slide one leg down to straight. Return. Be sure pelvis does not rock forward, tilt, rotate, or tip to side. Do _10__ times. Restabilize pelvis. Repeat with other leg. Do __1-2_ sets, __1-2_ times per day.  http://ss.exer.us/16   Copyright  VHI. All rights reserved.     Bridging    Slowly raise buttocks from floor, keeping stomach tight.  Hold 5 seconds. Repeat __10__ times per set. Do __1-2__ sets per session. Do _1___ sessions per day.  http://orth.exer.us/1097   Copyright  VHI. All rights reserved.    Gymball: Hamstring Curl (Double Leg)    Keep buttocks on mat/floor for first exercise. Lie on back, calves on ball, buttocks on floor. Raise buttocks then roll ball toward buttocks.   Repeat __10_ times per set. Lower buttocks to floor between rolls. Hold buttocks off floor between rolls. Do _1-2__ sets per session.  http://plyo.exer.us/167   Copyright  VHI. All rights reserved.   Gymball: Bridging (Double Leg)    Lie on back, calves on ball. Slowly raise and lower buttocks.  Hold 3-5 seconds. Repeat _10__ times per set. Do __1-2_  sets per session.  http://plyo.exer.us/165   Copyright  VHI. All rights reserved.     Plank    Support body on hands and feet. Keep hips in line with torso and arms straight under chest. Avoid locking elbows. Hold for _15___ seconds. Perform 5 reps. ADVANCED: Alternate legs, but keep leg in line with back.  Copyright  VHI. All rights reserved.    EXTENSION: Sitting - Exercise Ball (Active)    Sit with feet flat. Straighten right knee. Use _5__ lbs.  Alternate legs. Complete __1_ sets of _10__ repetitions. Perform __1-2_ sessions per day.  http://gtsc.exer.us/275   Copyright  VHI. All rights reserved.    Marching    March in place for _10__ reps.  Use 5 lbs ankle weights. Do _1-2__ times per day.  Copyright  VHI. All rights reserved.

## 2016-02-21 ENCOUNTER — Other Ambulatory Visit (HOSPITAL_COMMUNITY): Payer: Self-pay | Admitting: Gastroenterology

## 2016-02-21 DIAGNOSIS — R110 Nausea: Secondary | ICD-10-CM

## 2016-02-21 DIAGNOSIS — R1013 Epigastric pain: Secondary | ICD-10-CM

## 2016-02-21 DIAGNOSIS — R1011 Right upper quadrant pain: Secondary | ICD-10-CM

## 2016-02-26 ENCOUNTER — Telehealth: Payer: Self-pay | Admitting: Internal Medicine

## 2016-02-26 NOTE — Telephone Encounter (Signed)
APT. REMINDER CALL, LMTCB °

## 2016-02-27 ENCOUNTER — Other Ambulatory Visit: Payer: Self-pay | Admitting: Gynecologic Oncology

## 2016-02-27 ENCOUNTER — Encounter: Payer: Medicaid Other | Admitting: Internal Medicine

## 2016-02-27 ENCOUNTER — Encounter: Payer: Self-pay | Admitting: Gynecologic Oncology

## 2016-02-27 ENCOUNTER — Ambulatory Visit: Payer: Medicaid Other | Attending: Gynecologic Oncology | Admitting: Gynecologic Oncology

## 2016-02-27 ENCOUNTER — Other Ambulatory Visit (HOSPITAL_COMMUNITY)
Admission: RE | Admit: 2016-02-27 | Discharge: 2016-02-27 | Disposition: A | Discharge status: Home / Self Care | Payer: Medicaid Other | Source: Ambulatory Visit | Attending: Gynecologic Oncology | Admitting: Gynecologic Oncology

## 2016-02-27 VITALS — BP 124/82 | HR 95 | Temp 97.8°F | Resp 18 | Ht 65.0 in | Wt 169.8 lb

## 2016-02-27 DIAGNOSIS — Z833 Family history of diabetes mellitus: Secondary | ICD-10-CM | POA: Diagnosis not present

## 2016-02-27 DIAGNOSIS — Z801 Family history of malignant neoplasm of trachea, bronchus and lung: Secondary | ICD-10-CM | POA: Diagnosis not present

## 2016-02-27 DIAGNOSIS — Z836 Family history of other diseases of the respiratory system: Secondary | ICD-10-CM | POA: Diagnosis not present

## 2016-02-27 DIAGNOSIS — N898 Other specified noninflammatory disorders of vagina: Secondary | ICD-10-CM | POA: Diagnosis not present

## 2016-02-27 DIAGNOSIS — Z8249 Family history of ischemic heart disease and other diseases of the circulatory system: Secondary | ICD-10-CM | POA: Diagnosis not present

## 2016-02-27 DIAGNOSIS — Z8619 Personal history of other infectious and parasitic diseases: Secondary | ICD-10-CM | POA: Insufficient documentation

## 2016-02-27 DIAGNOSIS — Z90722 Acquired absence of ovaries, bilateral: Secondary | ICD-10-CM | POA: Insufficient documentation

## 2016-02-27 DIAGNOSIS — Z8261 Family history of arthritis: Secondary | ICD-10-CM | POA: Insufficient documentation

## 2016-02-27 DIAGNOSIS — Z9889 Other specified postprocedural states: Secondary | ICD-10-CM | POA: Diagnosis not present

## 2016-02-27 DIAGNOSIS — Z8041 Family history of malignant neoplasm of ovary: Secondary | ICD-10-CM | POA: Diagnosis not present

## 2016-02-27 DIAGNOSIS — F419 Anxiety disorder, unspecified: Secondary | ICD-10-CM | POA: Insufficient documentation

## 2016-02-27 DIAGNOSIS — Z90710 Acquired absence of both cervix and uterus: Secondary | ICD-10-CM | POA: Insufficient documentation

## 2016-02-27 DIAGNOSIS — C531 Malignant neoplasm of exocervix: Secondary | ICD-10-CM

## 2016-02-27 DIAGNOSIS — Z808 Family history of malignant neoplasm of other organs or systems: Secondary | ICD-10-CM | POA: Insufficient documentation

## 2016-02-27 DIAGNOSIS — Z8541 Personal history of malignant neoplasm of cervix uteri: Secondary | ICD-10-CM | POA: Insufficient documentation

## 2016-02-27 DIAGNOSIS — Z803 Family history of malignant neoplasm of breast: Secondary | ICD-10-CM | POA: Diagnosis not present

## 2016-02-27 DIAGNOSIS — Z923 Personal history of irradiation: Secondary | ICD-10-CM | POA: Insufficient documentation

## 2016-02-27 DIAGNOSIS — Z9221 Personal history of antineoplastic chemotherapy: Secondary | ICD-10-CM | POA: Insufficient documentation

## 2016-02-27 DIAGNOSIS — N939 Abnormal uterine and vaginal bleeding, unspecified: Secondary | ICD-10-CM | POA: Insufficient documentation

## 2016-02-27 DIAGNOSIS — Z853 Personal history of malignant neoplasm of breast: Secondary | ICD-10-CM | POA: Diagnosis not present

## 2016-02-27 DIAGNOSIS — Z01419 Encounter for gynecological examination (general) (routine) without abnormal findings: Secondary | ICD-10-CM | POA: Insufficient documentation

## 2016-02-27 DIAGNOSIS — Z87891 Personal history of nicotine dependence: Secondary | ICD-10-CM | POA: Insufficient documentation

## 2016-02-27 DIAGNOSIS — C539 Malignant neoplasm of cervix uteri, unspecified: Secondary | ICD-10-CM

## 2016-02-27 NOTE — Progress Notes (Signed)
GYNECOLOGIC ONCOLOGY RETURN VISIT    ASSESSMENT AND PLAN: Karen Daniels 40 y.o. 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 She had a large persistent tumor with chemorads and underwent a hysterectomy and bilateral LND. She was dispositioned to vaginal brachytherapy and Carbo/Taxol.  She is s/p 3 additional cycles of carboplatin which she completed in 11/15 as well as completing her vaginal brachytherapy.   She had a negative CT scan in September of this year. We'll plan on repeating it when she returns to see me in 3 months.  She will follow-up with gastroenterology as scheduled.   She will bring up the issue of her left leg pain with her primary care physician.  Her paternal aunt has been diagnosed with ovarian cancer. She has a personal history of breast cancer. They will let us know if we need to consider any issues with regards to genetic testing.  CC:  Chief Complaint  Patient presents with  . Follow-up    HISTORY: Karen Daniels is a 40 y.o. year old woman who presented with a history of Stage IB2 adenocarcinoma of the cervix with the following tumor history:                              Given her poor response to chemoradiation, with a persistent 5cm friable cervical mass, the decision was made to proceed with completion hysterectomy.   Procedure: 12/24/13 - Abdominal modified radical hysterectomy, bilateral salpingo-oophorectomy, bilateral pelvic lymphadenectomy.  Intraoperative Findings: There was an exophytic 5-6 cm cervical tumor with no evidence of parametrial invasion or obvious metastatic disease. The tubes and ovaries appeared normal bilaterally. The appendix appeared normal.   Pathology: Accession #: [id number]  Diagnosis: A: Uterus, cervix, bilateral tubes and ovaries, parametria, radical hysterectomy and upper vaginectomy   Histologic type: Mucinous adenocarcinoma   Histologic grade: well differentiated  (see comment)  Tumor focality/site: (quadrant(s) of cervix) circumferential   Tumor size: (greatest dimension) 5.0 cm   Extent of invasion:  Depth: 1.2 cm Wall thickness: 1.8 cm Percent: 71%  Lymphovascular space invasion: present   Parametrial soft tissue involvement: negative   Vaginal cuff involvement: positive for invasive adenocarcinoma from approximately 8 to 10 o'clock   Surgical margins:  Invasive: vaginal cuff margins positive from 8 to 10 o'clock  Intraepithelial: not applicable, none seen   Regional lymph nodes (see specimens B and C): Total number involved: 0  Total number examined: 8  Additional pathologic findings:  Endometrium: atrophy Myometrium: small benign leiomyomata  Right ovary: atrophy  Right fallopian tube: no significant pathologic abnormality, no neoplasm identified Left ovary: atrophy  Left fallopian tube: no significant pathologic abnormality, no neoplasm identified   B: Lymph nodes, right pelvic, regional resection  - No evidence of metastatic disease in five lymph nodes (0/5)   C: Lymph nodes, left pelvic, regional resection  - No evidence of metastatic disease in three lymph nodes (0/3)   Stage/Disposition: Karen Daniels is a 40 y.o. 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 Disposition to vaginal brachytherapy and consideration for further treatment with Carbo/Taxol after completing radiation treatment.   She had a CT scan 03/29/14 that revealed: IMPRESSION: 1. Mild stranding and trace free fluid in the pelvis and presacral space as well as mild rectosigmoid colitis and cystitis are likely postradiation and post surgical changes.   2. No residual cervical mass identified.   3. No evidence of  metastatic disease although sensitivity is decreased due to inflammatory changes. Recommend attention on follow up.   CT 6/16: IMPRESSION: 1. Postsurgical and postradiation changes in the pelvis without definite CT  evidence of recurrent or metastatic disease.   IMPRESSION 8/16 CT for SOB: -- No acute pulmonary embolus. -- No other acute process identified within the chest or upper abdomen.  Pap smear 2/17 was also negative.   Interval History: I last saw her in August. At that time her exam was unremarkable. She had a CT scan of the abdomen and pelvis 12/21/2015 that showed some radiation-induced large and small bowel wall thickening. The small bowel thickening was noted to be have improved since prior scans in April and the rectal wall thickening was similar. She had evidence of an interval T12 compression deformity. Her Pap smear was similarly normal. She comes in today for follow-up.   She is starting to go back to work and today's her first day back at work. Her daughters been diagnosed with lower lumbar scoliosis and is doing physical therapy. She was scheduled to have a colonoscopy with Dr. Collene Mares. She was unable to keep down the prep and had hematemesis therefore she is been rescheduled for colonoscopy for December 18 and they've changed the bowel prep that she was provided. Because of the hematemesis she has an ultrasound pending as well as the highest scan and those are on November 30. Dr. Collene Mares may or may not choose to do an upper endoscopy at the same time.  She states that beginning early in October she began experiencing some pain in the medial aspect of her left leg. She describes it as a cramp it is a little bit different than a charley horse. It does occasionally wake her at night. She has intermittently during the day and has been slightly increasing in severity since October. It is fairly random and the pain will last about 5 minutes to 20 minutes. It does not matter if she sitting, standing, walking. It can happen at any time. She states she's had a similar discomfort before and had a negative evaluation and negative Dopplers. She has not discussed this with her primary care physician. The area is  only tender to touch when she has this symptom and otherwise has no neuropathy or any other symptomatology. There is no weakness or difficulty with ambulation or activity.  Review of Systems  Constitutional: No weight loss or gain. Skin: No rash, sores, jaundice, itching, or dryness.  Cardiovascular: No chest pain, shortness of breath, or edema  Pulmonary: No cough  Gastro Intestinal: No nausea, vomiting, constipation, or diarrhea reported. + bright red blood per rectum Genitourinary: No frequency, urgency, or dysuria.  + vaginal bleeding with intercourse Musculoskeletal: Pain from her back. No weakness or neurologic issues. Leg pain as above Neurologic: No weakness  Past Medical History:   Past Medical History    Past Medical History  Diagnosis Date  . Cervical cancer (RAF-HCC) 08/2013  . Anxiety   . Depression   . Genital HSV   . PONV (postoperative nausea and vomiting)      Past Surgical History:   Past Surgical History    Past Surgical History  Procedure Laterality Date  . Wisdom tooth extraction    . Diagnostic laparoscopy      With lysis of adhesions  . Pr radical abd hysterec+pelv nodes Bilateral 12/24/2013    Procedure: RADICAL ABDOMINAL HYSTER, W/BIL TOTAL PELVIC LYMPHADENECTOMY & PARA-AORTIC LYMPH NODE BX W/WO REM TUBE/OVAR; Surgeon:  Sherol Dade, MD; Location: MAIN OR [facility]; Service: Gynecology Oncology     Oncology History:  Oncology History   Clinical Stage IB2 adenocarcinoma of the cervix with metastasis to pelvic lymph nodes.     Cervical cancer (RAF-HCC)   08/29/2013 Initial Diagnosis Presented to ED with vaginal discharge. Found to have large friable mass, biopsy showed adenocarcinoma of cervix, no LVSI.   09/07/2013 -  Cancer Staged Saw Dr. Alycia Rossetti in [place]. Exam showed 8cm mass fillign vagina, no parametrial or sidewall involvement - clinical IB2. PET showed positive pelvic LN.   10/11/2013 -  11/23/2013 Radiation EBRT, total of 45 cGy to whole pelvis and additional 9 cGy to PET positive nodes. Suboptimal response, not candidate for HDR.   10/11/2013 - 11/08/2013 Chemotherapy Sensitizing cisplatin 40 mg/m2 x5 cycles. Cycle 6 held due to heavy vaginal bleeding on 11/09/13, requiring admission to hospital and vaginal packing and transfusion of 3U pRBCs. Exam 11/15/13 showed persistent 5cm bulky, friable mass.   12/24/2013 Surgery Abdominal modified radical hysterectomy, bilateral salping-oophorectomy, bilateral pelvic lymphadenectomy.   - 02/22/2014 Radiation boost vaginal brachytherapy   01/24/2014 - 03/14/2014 Chemotherapy 3 cycles paclitaxel and carboplatin   Family History:   Family History    Family History  Problem Relation Age of Onset  . Heart disease Father   . COPD Father   . Diabetes Father   . Hypertension Father   . Arthritis Father   . Arthritis Mother   . Cancer Maternal Aunt     Breast  . Cancer Maternal Grandmother     Skin  . Cancer Paternal Grandmother     Breast  . Cancer Maternal Aunt     Lung  . Diabetes Maternal Grandmother   . Heart disease Maternal Grandmother      Social History:   Social History    Social History   Social History  . Marital Status: Divorced    Spouse Name: N/A  . Number of Children: N/A  . Years of Education: N/A   Social History Main Topics  . Smoking status: Former Smoker -- 0.50 packs/day for 2 years    Types: Cigarettes    Quit date: 08/30/2013  . Smokeless tobacco: Never Used  . Alcohol Use: 0.0 oz/week    0 Standard drinks or equivalent, 0 Glasses of wine, 0 Cans of beer, 0 Shots of liquor per week     Comment: Rare  . Drug Use: No  . Sexual Activity: Yes   Other Topics Concern  . None   Social History Narrative     Allergy: No Known Allergies  CURRENT MEDICATIONS:   Encounter Medications    Outpatient Encounter Prescriptions as of 05/22/2015  Medication Sig Dispense Refill  . acyclovir (ZOVIRAX) 400 MG tablet Take 1 tablet (400 mg total) by mouth daily at 10am. 90 tablet 4  . cyclobenzaprine (FLEXERIL) 10 MG tablet Take 10 mg by mouth nightly as needed. Hasn't started yet    . diclofenac sodium (VOLTAREN) 1 % gel Apply 2 g topically.    . gabapentin (NEURONTIN) 300 MG capsule Take 300 mg by mouth Three (3) times a day.    . sertraline (ZOLOFT) 100 MG tablet Take 100 mg by mouth.    . estrogens, conjugated, (PREMARIN) 0.625 MG tablet Take 1 tablet (0.625 mg total) by mouth daily. Take daily for 21 days then do not take for 7 days. 30 tablet 11  . [DISCONTINUED] conjugated estrogens (PREMARIN) 0.625 mg/gram vaginal cream Insert 0.5 g into  the vagina Two (2) times a week. 4 g 11  . [DISCONTINUED] LYRICA 50 mg capsule Take 300 mg by mouth Three (3) times a day.  0   No facility-administered encounter medications on file as of 05/22/2015.      PHYSICAL EXAM: Well-nourished well-developed female in no acute distress weighing back brace.  Neck: Supple, no lymphadenopathy, no thyromegaly.  Lungs: Clear to auscultation.  Cardiac S1: Regular rate and rhythm.  Abdomen: Soft, nontender, nondistended. There are no palpable masses or hepatomegaly.  Groins: No lymphadenopathy.  Extremities: No edema. Non-tender left leg.  Pelvic: External genitalia within normal limits. Vagina slightly atrophic. The top of the vagina was notable for radiation changes. Pap smear submitted without difficulty. There was some bleeding after the Pap. Bimanual examination reveals firmness but no nodularity. Rectal confirms. There is no blood on the examining finger. No nodularity or masses on rectal exam.  LABORATORY AND RADIOLOGIC STUDIES: Pap smear

## 2016-02-27 NOTE — Patient Instructions (Signed)
We will notify you of the results of your Pap smear from today. Please return to see me in 4 months.

## 2016-02-29 ENCOUNTER — Telehealth: Payer: Self-pay

## 2016-02-29 LAB — CYTOLOGY - PAP: Diagnosis: NEGATIVE

## 2016-02-29 NOTE — Telephone Encounter (Signed)
Orders received from [facility] to contact the patient to update with PAP results being "WNL" collected on 02/27/2016. Attempted to contact the patient , no answer , left a detailed message with call back information provided , if additional questions arise.

## 2016-03-04 ENCOUNTER — Ambulatory Visit: Payer: No Typology Code available for payment source | Attending: Neurological Surgery

## 2016-03-14 ENCOUNTER — Encounter (HOSPITAL_COMMUNITY)
Admission: RE | Admit: 2016-03-14 | Discharge: 2016-03-14 | Disposition: A | Discharge status: Home / Self Care | Payer: Medicaid Other | Source: Ambulatory Visit | Attending: Gastroenterology | Admitting: Gastroenterology

## 2016-03-14 DIAGNOSIS — R110 Nausea: Secondary | ICD-10-CM | POA: Insufficient documentation

## 2016-03-14 DIAGNOSIS — R1011 Right upper quadrant pain: Secondary | ICD-10-CM | POA: Diagnosis not present

## 2016-03-14 DIAGNOSIS — R1013 Epigastric pain: Secondary | ICD-10-CM | POA: Insufficient documentation

## 2016-03-14 MED ORDER — TECHNETIUM TC 99M MEBROFENIN IV KIT
5.2000 | PACK | Freq: Once | INTRAVENOUS | Status: AC | PRN
  Administered 2016-03-14: 5.2 via INTRAVENOUS

## 2016-03-26 ENCOUNTER — Other Ambulatory Visit: Payer: Self-pay | Admitting: Surgery

## 2016-03-30 ENCOUNTER — Other Ambulatory Visit: Payer: Self-pay | Admitting: Nurse Practitioner

## 2016-04-17 ENCOUNTER — Encounter (HOSPITAL_BASED_OUTPATIENT_CLINIC_OR_DEPARTMENT_OTHER): Payer: Self-pay | Admitting: *Deleted

## 2016-04-24 NOTE — H&P (Signed)
Karen Daniels 03/26/2016 2:22 PM Location: [facility] Patient #: [name] DOB: 1975-07-12 Divorced / Language: [name] / Race: White Female   History of Present Illness ([name] A. Ninfa Linden MD; 03/26/2016 2:40 PM) The patient is a 41 year old female who presents with abdominal pain. This is a pleasant patient referred by Dr. Carroll Sage for biliary dyskinesia and right upper quadrant abdominal pain. For several years, she's been having right upper quadrant abdominal pain with nausea and vomiting. She has a significant medical history of a previous cervical cancer which required a hysterectomy as well as chemotherapy and radiation. She felt some of her symptoms may have been secondary to this. Most recently, she is having ultrasound showing a gallbladder polyp and then a HIDA scan showing a 70% gallbladder ejection fraction. She has nausea and vomiting with fatty meals. Her bowel movements are otherwise normal. She does have occasional blood which is thought to be secondary to radiation changes. She will be getting a colonoscopy on December 18. Her right upper quadrant abdominal pain does hurt through to the back and is moderate in intensity.   Past Surgical History [name], [place]; 03/26/2016 2:22 PM) Hysterectomy (due to cancer) - Complete  Oral Surgery  Spinal Surgery Midback   Diagnostic Studies History [name], [place]; 03/26/2016 2:22 PM) Colonoscopy  within last year Mammogram  1-3 years ago Pap Smear  1-5 years ago  Allergies [name], [name]; 03/26/2016 2:22 PM) No Known Drug Allergies 03/26/2016  Medication History [name], [place]; 03/26/2016 2:23 PM) Oxycodone-Acetaminophen (5-325MG  Tablet, Oral as needed) Active. Acyclovir (400MG  Tablet, Oral daily) Active. DiazePAM (5MG  Tablet, Oral daily) Active. Medications Reconciled  Social History [name], [place]; 03/26/2016 2:22 PM) Alcohol use  Occasional alcohol use. Caffeine  use  Carbonated beverages, Coffee, Tea. No drug use  Tobacco use  Former smoker.  Family History [name], [place]; 03/26/2016 2:22 PM) Arthritis  Mother. Depression  Mother. Diabetes Mellitus  Father. Heart Disease  Father. Heart disease in female family member before age 63  Heart disease in female family member before age 57  Respiratory Condition  Father.  Pregnancy / Birth History [name], [place]; 03/26/2016 2:22 PM) Age at menarche  36 years. Age of menopause  <46 Contraceptive History  Contraceptive implant, Oral contraceptives. Gravida  4 Irregular periods  Maternal age  18-20 Para  2  Other Problems [name], [place]; 03/26/2016 2:22 PM) Cervical Cancer  Hemorrhoids  Oophorectomy     Review of Systems ([name]; 03/26/2016 2:22 PM) General Present- Fatigue, Night Sweats, Weight Gain and Weight Loss. Not Present- Appetite Loss, Chills and Fever. Skin Present- Dryness. Not Present- Change in Wart/Mole, Hives, Jaundice, New Lesions, Non-Healing Wounds, Rash and Ulcer. HEENT Present- Hoarseness, Nose Bleed, Oral Ulcers, Ringing in the Ears and Visual Disturbances. Not Present- Earache, Hearing Loss, Seasonal Allergies, Sinus Pain, Sore Throat, Wears glasses/contact lenses and Yellow Eyes. Breast Present- Breast Pain. Not Present- Breast Mass, Nipple Discharge and Skin Changes. Cardiovascular Present- Leg Cramps and Swelling of Extremities. Not Present- Chest Pain, Difficulty Breathing Lying Down, Palpitations, Rapid Heart Rate and Shortness of Breath. Gastrointestinal Present- Abdominal Pain, Bloating, Bloody Stool, Chronic diarrhea, Gets full quickly at meals, Hemorrhoids, Nausea and Vomiting. Not Present- Change in Bowel Habits, Constipation, Difficulty Swallowing, Excessive gas, Indigestion and Rectal Pain. Female Genitourinary Present- Frequency, Nocturia, Pelvic Pain and Urgency. Not Present- Painful Urination. Musculoskeletal Present-  Back Pain, Joint Pain, Muscle Pain, Muscle Weakness and Swelling of Extremities. Not Present- Joint  Stiffness. Neurological Present- Decreased Memory, Numbness and Tingling. Not Present- Fainting, Headaches, Seizures, Tremor, Trouble walking and Weakness. Psychiatric Present- Frequent crying. Not Present- Anxiety, Bipolar, Change in Sleep Pattern, Depression and Fearful. Endocrine Present- Hot flashes. Not Present- Cold Intolerance, Excessive Hunger, Hair Changes, Heat Intolerance and New Diabetes.  Vitals Bary Castilla Bradford CMA; 03/26/2016 2:23 PM) 03/26/2016 2:23 PM Weight: 172.6 lb Height: 64.5in Body Surface Area: 1.85 m Body Mass Index: 29.17 kg/m  Temp.: 98.63F  Pulse: 104 (Regular)  BP: 120/82 (Sitting, Left Arm, Standard)       Physical Exam (Krisanne Lich A. Ninfa Linden MD; 03/26/2016 2:41 PM) General Mental Status-Alert. General Appearance-Consistent with stated age. Hydration-Well hydrated. Voice-Normal.  Head and Neck Head-normocephalic, atraumatic with no lesions or palpable masses.  Eye Eyeball - Bilateral-Extraocular movements intact. Sclera/Conjunctiva - Bilateral-No scleral icterus.  Chest and Lung Exam Chest and lung exam reveals -quiet, even and easy respiratory effort with no use of accessory muscles and on auscultation, normal breath sounds, no adventitious sounds and normal vocal resonance. Inspection Chest Wall - Normal. Back - normal.  Cardiovascular Cardiovascular examination reveals -on palpation PMI is normal in location and amplitude, no palpable S3 or S4. Normal cardiac borders., normal heart sounds, regular rate and rhythm with no murmurs, carotid auscultation reveals no bruits and normal pedal pulses bilaterally.  Abdomen Inspection Inspection of the abdomen reveals - No Hernias. Skin - Scar - no surgical scars. Palpation/Percussion Palpation and Percussion of the abdomen reveal - Soft, Non Tender, No Rebound tenderness, No  Rigidity (guarding) and No hepatosplenomegaly. Auscultation Auscultation of the abdomen reveals - Bowel sounds normal.  Neurologic - Did not examine.  Musculoskeletal - Did not examine.    Assessment & Plan (Leomar Westberg A. Ninfa Linden MD; 03/26/2016 2:42 PM) BILIARY DYSKINESIA (K82.8) Impression: I have discussed the diagnosis with her. Because of her symptoms, her previous history of cancer, and a gallbladder polyp, laparoscopic cholecystectomy is recommended. I gave him literature regarding this. I discussed the surgical procedure in detail. I discussed the risks which include but are not limited to bleeding, infection, injury to surrounding structures, the need to convert to an open procedure, bile duct injury, bile leak, the chance this may not resolve all her symptoms, DVT, cardiopulmonary issues, postoperative recovery, etc. She understands and wished to proceed with surgery Current Plans Started Promethazine HCl 12.5MG , 1-2 Tablet every six hours, as needed, #30, 03/26/2016, Ref. x1. Pt Education - Pamphlet Given - Laparoscopic Gallbladder Surgery: discussed with patient and provid

## 2016-04-25 ENCOUNTER — Ambulatory Visit (HOSPITAL_BASED_OUTPATIENT_CLINIC_OR_DEPARTMENT_OTHER): Payer: Medicaid Other | Admitting: Anesthesiology

## 2016-04-25 ENCOUNTER — Encounter (HOSPITAL_BASED_OUTPATIENT_CLINIC_OR_DEPARTMENT_OTHER)
Admission: RE | Disposition: A | Discharge status: Home / Self Care | Payer: Self-pay | Source: Ambulatory Visit | Attending: Surgery

## 2016-04-25 ENCOUNTER — Ambulatory Visit (HOSPITAL_BASED_OUTPATIENT_CLINIC_OR_DEPARTMENT_OTHER)
Admission: RE | Admit: 2016-04-25 | Discharge: 2016-04-25 | Disposition: A | Discharge status: Home / Self Care | Payer: Medicaid Other | Source: Ambulatory Visit | Attending: Surgery | Admitting: Surgery

## 2016-04-25 ENCOUNTER — Encounter (HOSPITAL_BASED_OUTPATIENT_CLINIC_OR_DEPARTMENT_OTHER): Payer: Self-pay | Admitting: *Deleted

## 2016-04-25 DIAGNOSIS — Z9221 Personal history of antineoplastic chemotherapy: Secondary | ICD-10-CM | POA: Insufficient documentation

## 2016-04-25 DIAGNOSIS — Z87891 Personal history of nicotine dependence: Secondary | ICD-10-CM | POA: Diagnosis not present

## 2016-04-25 DIAGNOSIS — Z923 Personal history of irradiation: Secondary | ICD-10-CM | POA: Insufficient documentation

## 2016-04-25 DIAGNOSIS — R109 Unspecified abdominal pain: Secondary | ICD-10-CM | POA: Diagnosis present

## 2016-04-25 DIAGNOSIS — K811 Chronic cholecystitis: Secondary | ICD-10-CM | POA: Insufficient documentation

## 2016-04-25 HISTORY — DX: Other specified postprocedural states: Z98.890

## 2016-04-25 HISTORY — DX: Nausea with vomiting, unspecified: R11.2

## 2016-04-25 HISTORY — PX: CHOLECYSTECTOMY: SHX55

## 2016-04-25 SURGERY — LAPAROSCOPIC CHOLECYSTECTOMY
Anesthesia: General | Site: Abdomen

## 2016-04-25 MED ORDER — ACETAMINOPHEN 650 MG RE SUPP: 650.0000 mg | RECTAL | Status: DC | PRN

## 2016-04-25 MED ORDER — ROCURONIUM BROMIDE 100 MG/10ML IV SOLN
INTRAVENOUS | Status: DC | PRN
  Administered 2016-04-25: 35 mg via INTRAVENOUS

## 2016-04-25 MED ORDER — ROCURONIUM BROMIDE 10 MG/ML (PF) SYRINGE
PREFILLED_SYRINGE | INTRAVENOUS | Status: AC
  Filled 2016-04-25: qty 10

## 2016-04-25 MED ORDER — BUPIVACAINE HCL (PF) 0.25 % IJ SOLN
INTRAMUSCULAR | Status: AC
  Filled 2016-04-25: qty 30

## 2016-04-25 MED ORDER — MORPHINE SULFATE (PF) 2 MG/ML IV SOLN: 1.0000 mg | INTRAVENOUS | Status: DC | PRN

## 2016-04-25 MED ORDER — SCOPOLAMINE 1 MG/3DAYS TD PT72
MEDICATED_PATCH | TRANSDERMAL | Status: AC
  Filled 2016-04-25: qty 1

## 2016-04-25 MED ORDER — SUGAMMADEX SODIUM 200 MG/2ML IV SOLN
INTRAVENOUS | Status: DC | PRN
  Administered 2016-04-25: 200 mg via INTRAVENOUS

## 2016-04-25 MED ORDER — ACETAMINOPHEN 325 MG PO TABS: 650.0000 mg | ORAL_TABLET | ORAL | Status: DC | PRN

## 2016-04-25 MED ORDER — FENTANYL CITRATE (PF) 100 MCG/2ML IJ SOLN
INTRAMUSCULAR | Status: AC
  Filled 2016-04-25: qty 2

## 2016-04-25 MED ORDER — OXYCODONE HCL 5 MG PO TABS: 5.0000 mg | ORAL_TABLET | ORAL | Status: DC | PRN

## 2016-04-25 MED ORDER — SODIUM CHLORIDE 0.9 % IV SOLN: 250.0000 mL | INTRAVENOUS | Status: DC | PRN

## 2016-04-25 MED ORDER — SODIUM CHLORIDE 0.9 % IR SOLN
Status: DC | PRN
  Administered 2016-04-25: 1000 mL

## 2016-04-25 MED ORDER — DEXAMETHASONE SODIUM PHOSPHATE 10 MG/ML IJ SOLN
INTRAMUSCULAR | Status: AC
  Filled 2016-04-25: qty 1

## 2016-04-25 MED ORDER — ONDANSETRON HCL 4 MG/2ML IJ SOLN
INTRAMUSCULAR | Status: AC
  Filled 2016-04-25: qty 2

## 2016-04-25 MED ORDER — MIDAZOLAM HCL 2 MG/2ML IJ SOLN
1.0000 mg | INTRAMUSCULAR | Status: DC | PRN
  Administered 2016-04-25: 2 mg via INTRAVENOUS

## 2016-04-25 MED ORDER — CEFAZOLIN SODIUM-DEXTROSE 2-4 GM/100ML-% IV SOLN
2.0000 g | INTRAVENOUS | Status: AC
  Administered 2016-04-25: 2 g via INTRAVENOUS

## 2016-04-25 MED ORDER — PROPOFOL 10 MG/ML IV BOLUS
INTRAVENOUS | Status: DC | PRN
  Administered 2016-04-25: 150 mg via INTRAVENOUS

## 2016-04-25 MED ORDER — BUPIVACAINE HCL (PF) 0.5 % IJ SOLN
INTRAMUSCULAR | Status: AC
  Filled 2016-04-25: qty 120

## 2016-04-25 MED ORDER — KETOROLAC TROMETHAMINE 30 MG/ML IJ SOLN
INTRAMUSCULAR | Status: AC
  Filled 2016-04-25: qty 1

## 2016-04-25 MED ORDER — LACTATED RINGERS IV SOLN
INTRAVENOUS | Status: DC
  Administered 2016-04-25 (×2): via INTRAVENOUS

## 2016-04-25 MED ORDER — SODIUM CHLORIDE 0.9% FLUSH: 3.0000 mL | INTRAVENOUS | Status: DC | PRN

## 2016-04-25 MED ORDER — SCOPOLAMINE 1 MG/3DAYS TD PT72
1.0000 | MEDICATED_PATCH | Freq: Once | TRANSDERMAL | Status: DC | PRN
Start: 2016-04-25 — End: 2016-04-25
  Administered 2016-04-25: 1.5 mg via TRANSDERMAL

## 2016-04-25 MED ORDER — OXYCODONE-ACETAMINOPHEN 5-325 MG PO TABS: 1.0000 | ORAL_TABLET | ORAL | 0 refills | Status: DC | PRN

## 2016-04-25 MED ORDER — HYDROMORPHONE HCL 1 MG/ML IJ SOLN
INTRAMUSCULAR | Status: AC
  Filled 2016-04-25: qty 1

## 2016-04-25 MED ORDER — ONDANSETRON HCL 4 MG/2ML IJ SOLN
INTRAMUSCULAR | Status: DC | PRN
  Administered 2016-04-25: 4 mg via INTRAVENOUS

## 2016-04-25 MED ORDER — FENTANYL CITRATE (PF) 100 MCG/2ML IJ SOLN
50.0000 ug | INTRAMUSCULAR | Status: AC | PRN
  Administered 2016-04-25: 100 ug via INTRAVENOUS
  Administered 2016-04-25 (×2): 50 ug via INTRAVENOUS

## 2016-04-25 MED ORDER — PROPOFOL 500 MG/50ML IV EMUL
INTRAVENOUS | Status: AC
  Filled 2016-04-25: qty 50

## 2016-04-25 MED ORDER — CHLORHEXIDINE GLUCONATE CLOTH 2 % EX PADS: 6.0000 | MEDICATED_PAD | Freq: Once | CUTANEOUS | Status: DC

## 2016-04-25 MED ORDER — SODIUM CHLORIDE 0.9% FLUSH: 3.0000 mL | Freq: Two times a day (BID) | INTRAVENOUS | Status: DC

## 2016-04-25 MED ORDER — DEXAMETHASONE SODIUM PHOSPHATE 4 MG/ML IJ SOLN
INTRAMUSCULAR | Status: DC | PRN
  Administered 2016-04-25: 10 mg via INTRAVENOUS

## 2016-04-25 MED ORDER — HYDROMORPHONE HCL 1 MG/ML IJ SOLN
0.2500 mg | INTRAMUSCULAR | Status: DC | PRN
  Administered 2016-04-25 (×2): 0.5 mg via INTRAVENOUS

## 2016-04-25 MED ORDER — BUPIVACAINE HCL (PF) 0.5 % IJ SOLN
INTRAMUSCULAR | Status: DC | PRN
  Administered 2016-04-25: 20 mL

## 2016-04-25 MED ORDER — KETOROLAC TROMETHAMINE 30 MG/ML IJ SOLN
INTRAMUSCULAR | Status: DC | PRN
  Administered 2016-04-25: 30 mg via INTRAVENOUS

## 2016-04-25 MED ORDER — CEFAZOLIN SODIUM-DEXTROSE 2-4 GM/100ML-% IV SOLN
INTRAVENOUS | Status: AC
  Filled 2016-04-25: qty 100

## 2016-04-25 MED ORDER — LIDOCAINE HCL (CARDIAC) 20 MG/ML IV SOLN
INTRAVENOUS | Status: DC | PRN
Start: 2016-04-25 — End: 2016-04-25
  Administered 2016-04-25: 80 mg via INTRAVENOUS

## 2016-04-25 MED ORDER — MIDAZOLAM HCL 2 MG/2ML IJ SOLN
INTRAMUSCULAR | Status: AC
  Filled 2016-04-25: qty 2

## 2016-04-25 SURGICAL SUPPLY — 43 items
ADH SKN CLS APL DERMABOND .7 (GAUZE/BANDAGES/DRESSINGS) ×1
APPLIER CLIP 5 13 M/L LIGAMAX5 (MISCELLANEOUS) ×3
APR CLP MED LRG 5 ANG JAW (MISCELLANEOUS) ×1
BAG SPEC RTRVL LRG 6X4 10 (ENDOMECHANICALS) ×1
BLADE CLIPPER SURG (BLADE) IMPLANT
CHLORAPREP W/TINT 26ML (MISCELLANEOUS) ×3 IMPLANT
CLIP APPLIE 5 13 M/L LIGAMAX5 (MISCELLANEOUS) ×1 IMPLANT
COVER MAYO STAND STRL (DRAPES) IMPLANT
DECANTER SPIKE VIAL GLASS SM (MISCELLANEOUS) ×3 IMPLANT
DERMABOND ADVANCED (GAUZE/BANDAGES/DRESSINGS) ×2
DERMABOND ADVANCED .7 DNX12 (GAUZE/BANDAGES/DRESSINGS) ×1 IMPLANT
DRAPE C-ARM 42X72 X-RAY (DRAPES) IMPLANT
ELECT REM PT RETURN 9FT ADLT (ELECTROSURGICAL) ×3
ELECTRODE REM PT RTRN 9FT ADLT (ELECTROSURGICAL) ×1 IMPLANT
FILTER SMOKE EVAC LAPAROSHD (FILTER) IMPLANT
GLOVE BIOGEL PI IND STRL 7.0 (GLOVE) IMPLANT
GLOVE BIOGEL PI IND STRL 7.5 (GLOVE) IMPLANT
GLOVE BIOGEL PI INDICATOR 7.0 (GLOVE) ×4
GLOVE BIOGEL PI INDICATOR 7.5 (GLOVE) ×2
GLOVE ECLIPSE 7.5 STRL STRAW (GLOVE) ×2 IMPLANT
GLOVE SURG SIGNA 7.5 PF LTX (GLOVE) ×3 IMPLANT
GLOVE SURG SYN 7.5  E (GLOVE) ×2
GLOVE SURG SYN 7.5 E (GLOVE) ×1 IMPLANT
GLOVE SURG SYN 7.5 PF PI (GLOVE) IMPLANT
GOWN STRL REUS W/ TWL LRG LVL3 (GOWN DISPOSABLE) ×3 IMPLANT
GOWN STRL REUS W/TWL LRG LVL3 (GOWN DISPOSABLE) ×9
NS IRRIG 1000ML POUR BTL (IV SOLUTION) ×3 IMPLANT
PACK BASIN DAY SURGERY FS (CUSTOM PROCEDURE TRAY) ×3 IMPLANT
POUCH SPECIMEN RETRIEVAL 10MM (ENDOMECHANICALS) ×2 IMPLANT
SCISSORS LAP 5X35 DISP (ENDOMECHANICALS) ×2 IMPLANT
SET CHOLANGIOGRAPH 5 50 .035 (SET/KITS/TRAYS/PACK) IMPLANT
SET IRRIG TUBING LAPAROSCOPIC (IRRIGATION / IRRIGATOR) ×3 IMPLANT
SLEEVE ENDOPATH XCEL 5M (ENDOMECHANICALS) ×6 IMPLANT
SLEEVE SCD COMPRESS KNEE MED (MISCELLANEOUS) ×3 IMPLANT
SPECIMEN JAR SMALL (MISCELLANEOUS) ×1 IMPLANT
SUT MON AB 4-0 PC3 18 (SUTURE) ×3 IMPLANT
TOWEL OR 17X24 6PK STRL BLUE (TOWEL DISPOSABLE) ×3 IMPLANT
TRAY LAPAROSCOPIC (CUSTOM PROCEDURE TRAY) ×3 IMPLANT
TROCAR XCEL BLUNT TIP 100MML (ENDOMECHANICALS) ×3 IMPLANT
TROCAR XCEL NON-BLD 5MMX100MML (ENDOMECHANICALS) ×3 IMPLANT
TUBE CONNECTING 20'X1/4 (TUBING) ×1
TUBE CONNECTING 20X1/4 (TUBING) ×2 IMPLANT
TUBING INSUFFLATION (TUBING) ×3 IMPLANT

## 2016-04-25 NOTE — Anesthesia Preprocedure Evaluation (Addendum)
Anesthesia Evaluation  Patient identified by MRN, date of birth, ID band Patient awake    Reviewed: Allergy & Precautions, H&P , NPO status , Patient's Chart, lab work & pertinent test results  History of Anesthesia Complications (+) PONV  Airway Mallampati: II  TM Distance: >3 FB Neck ROM: Full    Dental no notable dental hx. (+) Teeth Intact, Dental Advisory Given   Pulmonary neg pulmonary ROS, former smoker,    Pulmonary exam normal breath sounds clear to auscultation       Cardiovascular negative cardio ROS   Rhythm:Regular Rate:Normal     Neuro/Psych Anxiety Depression negative neurological ROS     GI/Hepatic Neg liver ROS, GERD  Controlled,  Endo/Other  negative endocrine ROS  Renal/GU negative Renal ROS  negative genitourinary   Musculoskeletal   Abdominal   Peds  Hematology negative hematology ROS (+)   Anesthesia Other Findings   Reproductive/Obstetrics negative OB ROS                            Anesthesia Physical Anesthesia Plan  ASA: II  Anesthesia Plan: General   Post-op Pain Management:    Induction: Intravenous  Airway Management Planned: Oral ETT  Additional Equipment:   Intra-op Plan:   Post-operative Plan: Extubation in OR  Informed Consent: I have reviewed the patients History and Physical, chart, labs and discussed the procedure including the risks, benefits and alternatives for the proposed anesthesia with the patient or authorized representative who has indicated his/her understanding and acceptance.   Dental advisory given  Plan Discussed with: CRNA  Anesthesia Plan Comments:         Anesthesia Quick Evaluation

## 2016-04-25 NOTE — Anesthesia Postprocedure Evaluation (Signed)
Anesthesia Post Note  Patient: Karen Daniels  Procedure(s) Performed: Procedure(s) (LRB): LAPAROSCOPIC CHOLECYSTECTOMY (N/A)  Patient location during evaluation: PACU Anesthesia Type: General Level of consciousness: awake and alert Pain management: pain level controlled Vital Signs Assessment: post-procedure vital signs reviewed and stable Respiratory status: spontaneous breathing, nonlabored ventilation and respiratory function stable Cardiovascular status: blood pressure returned to baseline and stable Postop Assessment: no signs of nausea or vomiting Anesthetic complications: no       Last Vitals:  Vitals:   04/25/16 0943 04/25/16 0945  BP:  125/83  Pulse: 88 77  Resp: 17 20  Temp:      Last Pain:  Vitals:   04/25/16 0943  TempSrc:   PainSc: 3                  Jacinda Kanady,W. EDMOND

## 2016-04-25 NOTE — Op Note (Signed)
Laparoscopic Cholecystectomy Procedure Note  Indications: This patient presents with symptomatic gallbladder disease and will undergo laparoscopic cholecystectomy.  Pre-operative Diagnosis:  Biliary dyskinesia  Post-operative Diagnosis: Same with chronic cholecystitis  Surgeon: Coralie Keens A   Assistants:   Anesthesia: General endotracheal anesthesia  ASA Class: 2  Procedure Details  The patient was seen again in the Holding Room. The risks, benefits, complications, treatment options, and expected outcomes were discussed with the patient. The possibilities of reaction to medication, pulmonary aspiration, perforation of viscus, bleeding, recurrent infection, finding a normal gallbladder, the need for additional procedures, failure to diagnose a condition, the possible need to convert to an open procedure, and creating a complication requiring transfusion or operation were discussed with the patient. The likelihood of improving the patient's symptoms with return to their baseline status is good.  The patient and/or family concurred with the proposed plan, giving informed consent. The site of surgery properly noted. The patient was taken to Operating Room, identified as Karen Daniels and the procedure verified as Laparoscopic Cholecystectomy with Intraoperative Cholangiogram. A Time Out was held and the above information confirmed.  Prior to the induction of general anesthesia, antibiotic prophylaxis was administered. General endotracheal anesthesia was then administered and tolerated well. After the induction, the abdomen was prepped with Chloraprep and draped in sterile fashion. The patient was positioned in the supine position.  Local anesthetic agent was injected into the skin near the umbilicus and an incision made. We dissected down to the abdominal fascia with blunt dissection.  The fascia was incised vertically and we entered the peritoneal cavity bluntly.  A pursestring suture of  0-Vicryl was placed around the fascial opening.  The Hasson cannula was inserted and secured with the stay suture.  Pneumoperitoneum was then created with CO2 and tolerated well without any adverse changes in the patient's vital signs. A 5-mm port was placed in the subxiphoid position.  Two 5-mm ports were placed in the right upper quadrant. All skin incisions were infiltrated with a local anesthetic agent before making the incision and placing the trocars.   We positioned the patient in reverse Trendelenburg, tilted slightly to the patient's left.  The gallbladder was identified, the fundus grasped and retracted cephalad. There were multiple adhesions of omentum and stomach to the gallbladder Adhesions were lysed bluntly  where indicated, taking care not to injure any adjacent organs or viscus. The infundibulum was grasped and retracted laterally, exposing the peritoneum overlying the triangle of Calot. This was then divided and exposed in a blunt fashion. The cystic duct was clearly identified and bluntly dissected circumferentially. A critical view of the cystic duct and cystic artery was obtained.  The cystic duct was then ligated with clips and divided. The cystic artery was, dissected free, ligated with clips and divided as well.   The gallbladder was dissected from the liver bed in retrograde fashion with the electrocautery. The gallbladder was removed and placed in an Endocatch sac. The liver bed was irrigated and inspected. Hemostasis was achieved with the electrocautery. Copious irrigation was utilized and was repeatedly aspirated until clear.  The gallbladder and Endocatch sac were then removed through the umbilical port site.  The pursestring suture was used to close the umbilical fascia.    We again inspected the right upper quadrant for hemostasis.  Pneumoperitoneum was released as we removed the trocars.  4-0 Monocryl was used to close the skin.   Skin glue was then applied.  The patient was  then extubated and brought  to the recovery room in stable condition. Instrument, sponge, and needle counts were correct at closure and at the conclusion of the case.   Findings: Cholecystitis without Cholelithiasis  Estimated Blood Loss: Minimal         Drains: 0         Specimens: Gallbladder           Complications: None; patient tolerated the procedure well.         Disposition: PACU - hemodynamically stable.         Condition: stable

## 2016-04-25 NOTE — Discharge Instructions (Signed)
[facility] ______[facility] LAPAROSCOPIC SURGERY: POST OP INSTRUCTIONS Always review your discharge instruction sheet given to you by the facility where your surgery was performed. IF YOU HAVE DISABILITY OR FAMILY LEAVE FORMS, YOU MUST BRING THEM TO THE OFFICE FOR PROCESSING.   DO NOT GIVE THEM TO YOUR DOCTOR.  1. A prescription for pain medication may be given to you upon discharge.  Take your pain medication as prescribed, if needed.  If narcotic pain medicine is not needed, then you may take acetaminophen (Tylenol) or ibuprofen (Advil) as needed. 2. Take your usually prescribed medications unless otherwise directed. 3. If you need a refill on your pain medication, please contact your pharmacy.  They will contact our office to request authorization. Prescriptions will not be filled after 5pm or on week-ends. 4. You should follow a light diet the first few days after arrival home, such as soup and crackers, etc.  Be sure to include lots of fluids daily. 5. Most patients will experience some swelling and bruising in the area of the incisions.  Ice packs will help.  Swelling and bruising can take several days to resolve.  6. It is common to experience some constipation if taking pain medication after surgery.  Increasing fluid intake and taking a stool softener (such as Colace) will usually help or prevent this problem from occurring.  A mild laxative (Milk of Magnesia or Miralax) should be taken according to package instructions if there are no bowel movements after 48 hours. 7. Unless discharge instructions indicate otherwise, you may remove your bandages 24-48 hours after surgery, and you may shower at that time.  You may have steri-strips (small skin tapes) in place directly over the incision.  These strips should be left on the skin for 7-10 days.  If your surgeon used skin glue on the incision, you may shower in 24 hours.  The glue will flake off over the next 2-3 weeks.  Any sutures or  staples will be removed at the office during your follow-up visit. 8. ACTIVITIES:  You may resume regular (light) daily activities beginning the next day--such as daily self-care, walking, climbing stairs--gradually increasing activities as tolerated.  You may have sexual intercourse when it is comfortable.  Refrain from any heavy lifting or straining until approved by your doctor. a. You may drive when you are no longer taking prescription pain medication, you can comfortably wear a seatbelt, and you can safely maneuver your car and apply brakes. b. RETURN TO WORK:  __________________________________________________________ 9. You should see your doctor in the office for a follow-up appointment approximately 2-3 weeks after your surgery.  Make sure that you call for this appointment within a day or two after you arrive home to insure a convenient appointment time. 10. OTHER INSTRUCTIONS:NO LIFTING MORE THAN 15 POUNDS FOR 2 WEEKS 11. ICE PACK AND IBUPROFEN ALSO FOR PAIN 12. OK TO SHOWER TOMORROW __________________________________________________________________________________________________________________________ __________________________________________________________________________________________________________________________ WHEN TO CALL YOUR DOCTOR: 1. Fever over 101.0 2. Inability to urinate 3. Continued bleeding from incision. 4. Increased pain, redness, or drainage from the incision. 5. Increasing abdominal pain  The clinic staff is available to answer your questions during regular business hours.  Please dont hesitate to call and ask to speak to one of the nurses for clinical concerns.  If you have a medical emergency, go to the nearest emergency room or call 911.  A surgeon from [facility] is always on call at the hospital. [address] [place], [place], [place]  EO:[id number] ? [address], [place], [place]   [phone] ? [phone] ? FAX [phone]  [phone] Web site: [email or web address]   Post Anesthesia Home Care Instructions  Activity: Get plenty of rest for the remainder of the day. A responsible adult should stay with you for 24 hours following the procedure.  For the next 24 hours, DO NOT: -Drive a car -Paediatric nurse -Drink alcoholic beverages -Take any medication unless instructed by your physician -Make any legal decisions or sign important papers.  Meals: Start with liquid foods such as gelatin or soup. Progress to regular foods as tolerated. Avoid greasy, spicy, heavy foods. If nausea and/or vomiting occur, drink only clear liquids until the nausea and/or vomiting subsides. Call your physician if vomiting continues.  Special Instructions/Symptoms: Your throat may feel dry or sore from the anesthesia or the breathing tube placed in your throat during surgery. If this causes discomfort, gargle with warm salt water. The discomfort should disappear within 24 hours.  If you had a scopolamine patch placed behind your ear for the management of post- operative nausea and/or vomiting:  1. The medication in the patch is effective for 72 hours, after which it should be removed.  Wrap patch in a tissue and discard in the trash. Wash hands thoroughly with soap and water. 2. You may remove the patch earlier than 72 hours if you experience unpleasant side effects which may include dry mouth, dizziness or visual disturbances. 3. Avoid touching the patch. Wash your hands with soap and water after contact with the patch.

## 2016-04-25 NOTE — Anesthesia Procedure Notes (Signed)
Procedure Name: Intubation Date/Time: 04/25/2016 7:30 AM Performed by: Maryella Shivers Pre-anesthesia Checklist: Patient identified, Emergency Drugs available, Suction available and Patient being monitored Patient Re-evaluated:Patient Re-evaluated prior to inductionOxygen Delivery Method: Circle system utilized Preoxygenation: Pre-oxygenation with 100% oxygen Intubation Type: IV induction Ventilation: Mask ventilation without difficulty Laryngoscope Size: Mac and 3 Grade View: Grade I Tube type: Oral Tube size: 7.0 mm Number of attempts: 1 Airway Equipment and Method: Stylet and Oral airway Placement Confirmation: ETT inserted through vocal cords under direct vision,  positive ETCO2 and breath sounds checked- equal and bilateral Secured at: 20 cm Tube secured with: Tape Dental Injury: Teeth and Oropharynx as per pre-operative assessment

## 2016-04-25 NOTE — Transfer of Care (Signed)
Immediate Anesthesia Transfer of Care Note  Patient: Karen Daniels  Procedure(s) Performed: Procedure(s): LAPAROSCOPIC CHOLECYSTECTOMY (N/A)  Patient Location: PACU  Anesthesia Type:General  Level of Consciousness: sedated  Airway & Oxygen Therapy: Patient Spontanous Breathing and Patient connected to face mask oxygen  Post-op Assessment: Report given to RN and Post -op Vital signs reviewed and stable  Post vital signs: Reviewed and stable  Last Vitals:  Vitals:   04/25/16 0633 04/25/16 0817  BP: 108/68   Pulse: 81 92  Resp: 16 11  Temp: 36.8 C     Last Pain:  Vitals:   04/25/16 0633  TempSrc: Oral         Complications: No apparent anesthesia complications

## 2016-04-25 NOTE — Interval H&P Note (Signed)
History and Physical Interval Note: no change in H and P  04/25/2016 7:09 AM  Karen Daniels  has presented today for surgery, with the diagnosis of biliary dyskinesia  The various methods of treatment have been discussed with the patient and family. After consideration of risks, benefits and other options for treatment, the patient has consented to  Procedure(s): LAPAROSCOPIC CHOLECYSTECTOMY (N/A) as a surgical intervention .  The patient's history has been reviewed, patient examined, no change in status, stable for surgery.  I have reviewed the patient's chart and labs.  Questions were answered to the patient's satisfaction.     [name] A

## 2016-04-26 ENCOUNTER — Encounter (HOSPITAL_BASED_OUTPATIENT_CLINIC_OR_DEPARTMENT_OTHER): Payer: Self-pay | Admitting: Surgery

## 2016-06-07 ENCOUNTER — Telehealth: Payer: Self-pay

## 2016-06-07 NOTE — Telephone Encounter (Signed)
Karen Daniels called stating that she will not be able to have CT scan on 06-11-16 as she will be out of town. Told her that Dr. Alycia Rossetti does not have a clinic in [place] until 08-07-16. Pt not able to go to [facility] for a f/u appointment due to work schedule. She will keep appointment with Dr. Alycia Rossetti on 06-13-15 as scheduled and r/s CT scan For early march as her schedule will allow.

## 2016-06-10 ENCOUNTER — Ambulatory Visit (HOSPITAL_COMMUNITY): Payer: No Typology Code available for payment source

## 2016-06-11 ENCOUNTER — Other Ambulatory Visit: Payer: Self-pay | Admitting: Gynecologic Oncology

## 2016-06-11 ENCOUNTER — Other Ambulatory Visit (HOSPITAL_COMMUNITY)
Admission: RE | Admit: 2016-06-11 | Discharge: 2016-06-11 | Disposition: A | Discharge status: Home / Self Care | Payer: Medicaid Other | Source: Ambulatory Visit | Attending: Gynecologic Oncology | Admitting: Gynecologic Oncology

## 2016-06-11 ENCOUNTER — Ambulatory Visit (HOSPITAL_COMMUNITY)
Admission: RE | Admit: 2016-06-11 | Discharge: 2016-06-11 | Disposition: A | Discharge status: Home / Self Care | Payer: Medicaid Other | Source: Ambulatory Visit | Attending: Gynecologic Oncology | Admitting: Gynecologic Oncology

## 2016-06-11 ENCOUNTER — Encounter (HOSPITAL_COMMUNITY): Payer: Self-pay

## 2016-06-11 DIAGNOSIS — C539 Malignant neoplasm of cervix uteri, unspecified: Secondary | ICD-10-CM

## 2016-06-11 DIAGNOSIS — C531 Malignant neoplasm of exocervix: Secondary | ICD-10-CM

## 2016-06-11 DIAGNOSIS — Z8541 Personal history of malignant neoplasm of cervix uteri: Secondary | ICD-10-CM | POA: Insufficient documentation

## 2016-06-11 DIAGNOSIS — Z01411 Encounter for gynecological examination (general) (routine) with abnormal findings: Secondary | ICD-10-CM | POA: Insufficient documentation

## 2016-06-11 DIAGNOSIS — Z9889 Other specified postprocedural states: Secondary | ICD-10-CM | POA: Diagnosis not present

## 2016-06-11 MED ORDER — IOPAMIDOL (ISOVUE-300) INJECTION 61%
INTRAVENOUS | Status: AC
  Administered 2016-06-11: 100 mL
  Filled 2016-06-11: qty 100

## 2016-06-11 MED ORDER — IOPAMIDOL (ISOVUE-300) INJECTION 61%
INTRAVENOUS | Status: DC
Start: 2016-06-11 — End: 2016-06-12
  Filled 2016-06-11: qty 30

## 2016-06-11 MED ORDER — IOPAMIDOL (ISOVUE-300) INJECTION 61%
30.0000 mL | Freq: Once | INTRAVENOUS | Status: DC | PRN
  Administered 2016-06-11: 30 mL via ORAL
  Filled 2016-06-11: qty 30

## 2016-06-11 NOTE — Progress Notes (Signed)
GYNECOLOGIC ONCOLOGY RETURN VISIT    ASSESSMENT AND PLAN: JUDY NOLF 41 y.o. 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 She had a large persistent tumor with chemorads and underwent a hysterectomy and bilateral LND. She was dispositioned to vaginal brachytherapy and Carbo/Taxol.  She is s/p 3 additional cycles of carboplatin which she completed in 11/15 as well as completing her vaginal brachytherapy.   She had a negative CT scan last week. We'll follow-up in results for Pap smear from today. She'll return to see me in 3 months. We'll most likely repeat a CT scan in September 2018.  I refilled her acyclovir.   CC:  Chief Complaint  Patient presents with  . Follow-up    HISTORY: MARGARETMARY SORIA is a 41 y.o. year old woman who presented with a history of Stage IB2 adenocarcinoma of the cervix with the following tumor history:                              Given her poor response to chemoradiation, with a persistent 5cm friable cervical mass, the decision was made to proceed with completion hysterectomy.   Procedure: 12/24/13 - Abdominal modified radical hysterectomy, bilateral salpingo-oophorectomy, bilateral pelvic lymphadenectomy.  Intraoperative Findings: There was an exophytic 5-6 cm cervical tumor with no evidence of parametrial invasion or obvious metastatic disease. The tubes and ovaries appeared normal bilaterally. The appendix appeared normal.   Pathology: Accession #: [id number]  Diagnosis: A: Uterus, cervix, bilateral tubes and ovaries, parametria, radical hysterectomy and upper vaginectomy   Histologic type: Mucinous adenocarcinoma   Histologic grade: well differentiated (see comment)  Tumor focality/site: (quadrant(s) of cervix) circumferential   Tumor size: (greatest dimension) 5.0 cm   Extent of invasion:  Depth: 1.2 cm Wall thickness: 1.8 cm Percent: 71%  Lymphovascular space invasion: present    Parametrial soft tissue involvement: negative   Vaginal cuff involvement: positive for invasive adenocarcinoma from approximately 8 to 10 o'clock   Surgical margins:  Invasive: vaginal cuff margins positive from 8 to 10 o'clock  Intraepithelial: not applicable, none seen   Regional lymph nodes (see specimens B and C): Total number involved: 0  Total number examined: 8  Additional pathologic findings:  Endometrium: atrophy Myometrium: small benign leiomyomata  Right ovary: atrophy  Right fallopian tube: no significant pathologic abnormality, no neoplasm identified Left ovary: atrophy  Left fallopian tube: no significant pathologic abnormality, no neoplasm identified   B: Lymph nodes, right pelvic, regional resection  - No evidence of metastatic disease in five lymph nodes (0/5)   C: Lymph nodes, left pelvic, regional resection  - No evidence of metastatic disease in three lymph nodes (0/3)   Stage/Disposition: DALEYSA ZEISE is a 41 y.o. 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 Disposition to vaginal brachytherapy and consideration for further treatment with Carbo/Taxol after completing radiation treatment.   She had a CT scan 03/29/14 that revealed: IMPRESSION: 1. Mild stranding and trace free fluid in the pelvis and presacral space as well as mild rectosigmoid colitis and cystitis are likely postradiation and post surgical changes.   2. No residual cervical mass identified.   3. No evidence of metastatic disease although sensitivity is decreased due to inflammatory changes. Recommend attention on follow up.   CT 6/16: IMPRESSION: 1. Postsurgical and postradiation changes in the pelvis without definite CT evidence of recurrent or metastatic disease.   IMPRESSION 8/16 CT for SOB: --  No acute pulmonary embolus. -- No other acute process identified within the chest or upper abdomen.  Pap smear 2/17 was also negative.   Interval History: I  saw her in August. At that time her exam was unremarkable. She had a CT scan of the abdomen and pelvis 12/21/2015 that showed some radiation-induced large and small bowel wall thickening. The small bowel thickening was noted to be have improved since prior scans in April and the rectal wall thickening was similar. She had evidence of an interval T12 compression deformity. Her Pap smear was similarly normal as was her exam in 11/17. She comes in today for follow-up.   CT 06/12/16: IMPRESSION: 1. Stable CT appearance of the abdomen/pelvis when compared to prior study from September 2017. No findings for local recurrent disease, regional lymphadenopathy or metastasis. 2. Stable surgical and radiation changes involving the pelvis.  Showing a laparoscopic cholecystectomy Gen. 11 that is very well since that. She states short he notices a difference. She is overall feeling very well. Her daughter is doing very well in Research scientist (medical) competitions and they recently won a large competition in [place]. She otherwise has really no complaints whatsoever. She comes accompanied by her mother today.   Review of Systems  Constitutional: No weight loss or gain. Skin: No rash, sores, jaundice, itching, or dryness.  Cardiovascular: No chest pain, shortness of breath, or edema  Pulmonary: No cough  Gastro Intestinal: No nausea, vomiting, constipation, or diarrhea reported.  Genitourinary: No frequency, urgency, or dysuria.  Occ. vaginal bleeding with intercourse Musculoskeletal: Pain from her back. No weakness or neurologic issues.  Neurologic: No weakness  Past Medical History:   Past Medical History    Past Medical History  Diagnosis Date  . Cervical cancer (RAF-HCC) 08/2013  . Anxiety   . Depression   . Genital HSV   . PONV (postoperative nausea and vomiting)      Past Surgical History:   Past Surgical History    Past Surgical History  Procedure Laterality Date  . Wisdom tooth  extraction    . Diagnostic laparoscopy      With lysis of adhesions  . Pr radical abd hysterec+pelv nodes Bilateral 12/24/2013    Procedure: RADICAL ABDOMINAL HYSTER, W/BIL TOTAL PELVIC LYMPHADENECTOMY & PARA-AORTIC LYMPH NODE BX W/WO REM TUBE/OVAR; Surgeon: Sherol Dade, MD; Location: MAIN OR [facility]; Service: Gynecology Oncology     Oncology History:  Oncology History   Clinical Stage IB2 adenocarcinoma of the cervix with metastasis to pelvic lymph nodes.     Cervical cancer (RAF-HCC)   08/29/2013 Initial Diagnosis Presented to ED with vaginal discharge. Found to have large friable mass, biopsy showed adenocarcinoma of cervix, no LVSI.   09/07/2013 -  Cancer Staged Saw Dr. Alycia Rossetti in [place]. Exam showed 8cm mass fillign vagina, no parametrial or sidewall involvement - clinical IB2. PET showed positive pelvic LN.   10/11/2013 - 11/23/2013 Radiation EBRT, total of 45 cGy to whole pelvis and additional 9 cGy to PET positive nodes. Suboptimal response, not candidate for HDR.   10/11/2013 - 11/08/2013 Chemotherapy Sensitizing cisplatin 40 mg/m2 x5 cycles. Cycle 6 held due to heavy vaginal bleeding on 11/09/13, requiring admission to hospital and vaginal packing and transfusion of 3U pRBCs. Exam 11/15/13 showed persistent 5cm bulky, friable mass.   12/24/2013 Surgery Abdominal modified radical hysterectomy, bilateral salping-oophorectomy, bilateral pelvic lymphadenectomy.   - 02/22/2014 Radiation boost vaginal brachytherapy   01/24/2014 - 03/14/2014 Chemotherapy 3 cycles paclitaxel and carboplatin   Family History:  Family History    Family History  Problem Relation Age of Onset  . Heart disease Father   . COPD Father   . Diabetes Father   . Hypertension Father   . Arthritis Father   . Arthritis Mother   . Cancer Maternal Aunt     Breast  . Cancer Maternal Grandmother     Skin  . Cancer Paternal  Grandmother     Breast  . Cancer Maternal Aunt     Lung  . Diabetes Maternal Grandmother   . Heart disease Maternal Grandmother      Social History:   Social History    Social History   Social History  . Marital Status: Divorced    Spouse Name: N/A  . Number of Children: N/A  . Years of Education: N/A   Social History Main Topics  . Smoking status: Former Smoker -- 0.50 packs/day for 2 years    Types: Cigarettes    Quit date: 08/30/2013  . Smokeless tobacco: Never Used  . Alcohol Use: 0.0 oz/week    0 Standard drinks or equivalent, 0 Glasses of wine, 0 Cans of beer, 0 Shots of liquor per week     Comment: Rare  . Drug Use: No  . Sexual Activity: Yes   Other Topics Concern  . None   Social History Narrative     Allergy: No Known Allergies  CURRENT MEDICATIONS:  Encounter Medications    Outpatient Encounter Prescriptions as of 05/22/2015  Medication Sig Dispense Refill  . acyclovir (ZOVIRAX) 400 MG tablet Take 1 tablet (400 mg total) by mouth daily at 10am. 90 tablet 4  . cyclobenzaprine (FLEXERIL) 10 MG tablet Take 10 mg by mouth nightly as needed. Hasn't started yet    . diclofenac sodium (VOLTAREN) 1 % gel Apply 2 g topically.    . gabapentin (NEURONTIN) 300 MG capsule Take 300 mg by mouth Three (3) times a day.    . sertraline (ZOLOFT) 100 MG tablet Take 100 mg by mouth.    . estrogens, conjugated, (PREMARIN) 0.625 MG tablet Take 1 tablet (0.625 mg total) by mouth daily. Take daily for 21 days then do not take for 7 days. 30 tablet 11  . [DISCONTINUED] conjugated estrogens (PREMARIN) 0.625 mg/gram vaginal cream Insert 0.5 g into the vagina Two (2) times a week. 4 g 11  . [DISCONTINUED] LYRICA 50 mg capsule Take 300 mg by mouth Three (3) times a day.  0   No facility-administered encounter medications on file as of 05/22/2015.      PHYSICAL  EXAM: Well-nourished well-developed female in no acute distress weighing back brace.  Neck: Supple, no lymphadenopathy, no thyromegaly.  Lungs: Clear to auscultation.  Cardiac S1: Regular rate and rhythm.  Abdomen: Soft, nontender, nondistended. There are no palpable masses or hepatomegaly.  Groins: No lymphadenopathy.  Extremities: No edema. Non-tender left leg.  Pelvic: External genitalia within normal limits. Vagina slightly atrophic. The top of the vagina was notable for radiation changes. Pap smear submitted without difficulty. There was some bleeding after the Pap. Bimanual examination reveals firmness but no nodularity. Rectal confirms. There is no blood on the examining finger. No nodularity or masses on rectal exam.  LABORATORY AND RADIOLOGIC STUDIES: Pap smear

## 2016-06-12 ENCOUNTER — Other Ambulatory Visit: Payer: Self-pay | Admitting: Gynecologic Oncology

## 2016-06-12 ENCOUNTER — Encounter: Payer: Self-pay | Admitting: Gynecologic Oncology

## 2016-06-12 ENCOUNTER — Ambulatory Visit: Payer: Medicaid Other | Attending: Gynecologic Oncology | Admitting: Gynecologic Oncology

## 2016-06-12 ENCOUNTER — Other Ambulatory Visit: Payer: Self-pay

## 2016-06-12 VITALS — BP 123/78 | HR 91 | Temp 99.0°F | Resp 18 | Ht 65.0 in | Wt 165.5 lb

## 2016-06-12 DIAGNOSIS — Z803 Family history of malignant neoplasm of breast: Secondary | ICD-10-CM | POA: Insufficient documentation

## 2016-06-12 DIAGNOSIS — F329 Major depressive disorder, single episode, unspecified: Secondary | ICD-10-CM | POA: Diagnosis not present

## 2016-06-12 DIAGNOSIS — Z836 Family history of other diseases of the respiratory system: Secondary | ICD-10-CM | POA: Insufficient documentation

## 2016-06-12 DIAGNOSIS — C531 Malignant neoplasm of exocervix: Secondary | ICD-10-CM

## 2016-06-12 DIAGNOSIS — Z8261 Family history of arthritis: Secondary | ICD-10-CM | POA: Diagnosis not present

## 2016-06-12 DIAGNOSIS — C539 Malignant neoplasm of cervix uteri, unspecified: Secondary | ICD-10-CM | POA: Insufficient documentation

## 2016-06-12 DIAGNOSIS — Z833 Family history of diabetes mellitus: Secondary | ICD-10-CM | POA: Insufficient documentation

## 2016-06-12 DIAGNOSIS — Z87891 Personal history of nicotine dependence: Secondary | ICD-10-CM | POA: Diagnosis not present

## 2016-06-12 DIAGNOSIS — Z9889 Other specified postprocedural states: Secondary | ICD-10-CM | POA: Diagnosis not present

## 2016-06-12 DIAGNOSIS — Z923 Personal history of irradiation: Secondary | ICD-10-CM

## 2016-06-12 DIAGNOSIS — F419 Anxiety disorder, unspecified: Secondary | ICD-10-CM | POA: Insufficient documentation

## 2016-06-12 DIAGNOSIS — Z801 Family history of malignant neoplasm of trachea, bronchus and lung: Secondary | ICD-10-CM | POA: Diagnosis not present

## 2016-06-12 DIAGNOSIS — Z8249 Family history of ischemic heart disease and other diseases of the circulatory system: Secondary | ICD-10-CM | POA: Diagnosis not present

## 2016-06-12 DIAGNOSIS — Z90710 Acquired absence of both cervix and uterus: Secondary | ICD-10-CM | POA: Diagnosis not present

## 2016-06-12 DIAGNOSIS — Z9221 Personal history of antineoplastic chemotherapy: Secondary | ICD-10-CM | POA: Diagnosis not present

## 2016-06-12 DIAGNOSIS — Z808 Family history of malignant neoplasm of other organs or systems: Secondary | ICD-10-CM | POA: Diagnosis not present

## 2016-06-12 MED ORDER — ACYCLOVIR 400 MG PO TABS: 400.0000 mg | ORAL_TABLET | Freq: Two times a day (BID) | ORAL | 2 refills | Status: DC

## 2016-06-12 NOTE — Patient Instructions (Signed)
Return to GYN oncology in 3 months. We'll notify you of the results of your Pap smear.

## 2016-06-13 NOTE — Addendum Note (Signed)
Addended by: Clayborne Artist A on: 06/13/2016 01:19 PM   Modules accepted: Orders

## 2016-06-17 LAB — CYTOLOGY - PAP: DIAGNOSIS: NEGATIVE

## 2016-06-24 ENCOUNTER — Telehealth: Payer: Self-pay

## 2016-06-24 NOTE — Telephone Encounter (Signed)
Pt notified about pap results: negative.  No questions or concerns voiced. 

## 2016-08-28 ENCOUNTER — Other Ambulatory Visit (HOSPITAL_COMMUNITY)
Admission: RE | Admit: 2016-08-28 | Discharge: 2016-08-28 | Disposition: A | Discharge status: Home / Self Care | Payer: Medicaid Other | Source: Ambulatory Visit | Attending: Gynecologic Oncology | Admitting: Gynecologic Oncology

## 2016-08-28 ENCOUNTER — Ambulatory Visit: Payer: Medicaid Other | Attending: Gynecologic Oncology | Admitting: Gynecologic Oncology

## 2016-08-28 ENCOUNTER — Encounter: Payer: Self-pay | Admitting: Gynecologic Oncology

## 2016-08-28 VITALS — BP 124/80 | HR 80 | Temp 98.2°F | Resp 18 | Wt 166.3 lb

## 2016-08-28 DIAGNOSIS — Z79899 Other long term (current) drug therapy: Secondary | ICD-10-CM | POA: Insufficient documentation

## 2016-08-28 DIAGNOSIS — C531 Malignant neoplasm of exocervix: Secondary | ICD-10-CM | POA: Diagnosis not present

## 2016-08-28 DIAGNOSIS — F329 Major depressive disorder, single episode, unspecified: Secondary | ICD-10-CM | POA: Insufficient documentation

## 2016-08-28 DIAGNOSIS — Z87891 Personal history of nicotine dependence: Secondary | ICD-10-CM | POA: Insufficient documentation

## 2016-08-28 DIAGNOSIS — C539 Malignant neoplasm of cervix uteri, unspecified: Secondary | ICD-10-CM | POA: Diagnosis not present

## 2016-08-28 DIAGNOSIS — F419 Anxiety disorder, unspecified: Secondary | ICD-10-CM | POA: Insufficient documentation

## 2016-08-28 NOTE — Progress Notes (Signed)
GYNECOLOGIC ONCOLOGY RETURN VISIT    ASSESSMENT AND PLAN: Karen Daniels 41 y.o. 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 She had a large persistent tumor with chemorads and underwent a hysterectomy and bilateral LND. She was dispositioned to vaginal brachytherapy and Carbo/Taxol.  She is s/p 3 additional cycles of carboplatin which she completed in 11/15 as well as completing her vaginal brachytherapy.   She had a negative CT scan last week. We'll follow-up in results for Pap smear from today. She'll return to see me in 3 months with a CT scan the same day.  CC:  Chief Complaint  Patient presents with  . Follow-up    HISTORY: Karen Daniels is a 41 y.o. year old woman who presented with a history of Stage IB2 adenocarcinoma of the cervix with the following tumor history:                              Given her poor response to chemoradiation, with a persistent 5cm friable cervical mass, the decision was made to proceed with completion hysterectomy.   Procedure: 12/24/13 - Abdominal modified radical hysterectomy, bilateral salpingo-oophorectomy, bilateral pelvic lymphadenectomy.  Intraoperative Findings: There was an exophytic 5-6 cm cervical tumor with no evidence of parametrial invasion or obvious metastatic disease. The tubes and ovaries appeared normal bilaterally. The appendix appeared normal.   Pathology: Accession #: [id number]  Diagnosis: A: Uterus, cervix, bilateral tubes and ovaries, parametria, radical hysterectomy and upper vaginectomy   Histologic type: Mucinous adenocarcinoma   Histologic grade: well differentiated (see comment)  Tumor focality/site: (quadrant(s) of cervix) circumferential   Tumor size: (greatest dimension) 5.0 cm   Extent of invasion:  Depth: 1.2 cm Wall thickness: 1.8 cm Percent: 71%  Lymphovascular space invasion: present   Parametrial soft tissue involvement: negative   Vaginal  cuff involvement: positive for invasive adenocarcinoma from approximately 8 to 10 o'clock   Surgical margins:  Invasive: vaginal cuff margins positive from 8 to 10 o'clock  Intraepithelial: not applicable, none seen   Regional lymph nodes (see specimens B and C): Total number involved: 0  Total number examined: 8  Additional pathologic findings:  Endometrium: atrophy Myometrium: small benign leiomyomata  Right ovary: atrophy  Right fallopian tube: no significant pathologic abnormality, no neoplasm identified Left ovary: atrophy  Left fallopian tube: no significant pathologic abnormality, no neoplasm identified   B: Lymph nodes, right pelvic, regional resection  - No evidence of metastatic disease in five lymph nodes (0/5)   C: Lymph nodes, left pelvic, regional resection  - No evidence of metastatic disease in three lymph nodes (0/3)   Stage/Disposition: Karen Daniels is a 41 y.o. 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 Disposition to vaginal brachytherapy and consideration for further treatment with Carbo/Taxol after completing radiation treatment.   She had a CT scan 03/29/14 that revealed: IMPRESSION: 1. Mild stranding and trace free fluid in the pelvis and presacral space as well as mild rectosigmoid colitis and cystitis are likely postradiation and post surgical changes.   2. No residual cervical mass identified.   3. No evidence of metastatic disease although sensitivity is decreased due to inflammatory changes. Recommend attention on follow up.   CT 6/16: IMPRESSION: 1. Postsurgical and postradiation changes in the pelvis without definite CT evidence of recurrent or metastatic disease.   IMPRESSION 8/16 CT for SOB: -- No acute pulmonary embolus. -- No other acute process  identified within the chest or upper abdomen.  Pap smear 2/17 was also negative.   Interval History:  She had a CT scan of the abdomen and pelvis 12/21/2015 that  showed some radiation-induced large and small bowel wall thickening. The small bowel thickening was noted to be have improved since prior scans in April and the rectal wall thickening was similar. She had evidence of an interval T12 compression deformity. Her Pap smear was similarly normal as was her exam in 11/17. She comes in today for follow-up.   CT 06/12/16: IMPRESSION: 1. Stable CT appearance of the abdomen/pelvis when compared to prior study from September 2017. No findings for local recurrent disease, regional lymphadenopathy or metastasis. 2. Stable surgical and radiation changes involving the pelvis.  I last saw her in February 2018 at which time her exam and Pap smear was normal in addition to her CT scan. She comes in today for follow-up. She's overall doing well and really denies any complaints whatsoever. Her daughter is going to be going to cheer school and [employer] and she spends every other week in [place] with her. Her energy levels good she's had no bleeding. There've been no changes in her medical history since we last saw her. Her family is all well.   Review of Systems  Constitutional: No weight loss or gain. Skin: No rash, sores, jaundice, itching, or dryness.  Cardiovascular: No chest pain, shortness of breath, or edema  Pulmonary: No cough  Gastro Intestinal: No nausea, vomiting, constipation, or diarrhea reported.  Genitourinary: No frequency, urgency, or dysuria.  Occ. vaginal bleeding with intercourse Musculoskeletal: No weakness or neurologic issues.  Neurologic: No weakness  Past Medical History:   Past Medical History    Past Medical History  Diagnosis Date  . Cervical cancer (RAF-HCC) 08/2013  . Anxiety   . Depression   . Genital HSV   . PONV (postoperative nausea and vomiting)      Past Surgical History:   Past Surgical History    Past Surgical History  Procedure Laterality Date  . Wisdom tooth extraction    . Diagnostic  laparoscopy      With lysis of adhesions  . 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     Cervical cancer (RAF-HCC)   08/29/2013 Initial Diagnosis Presented to ED with vaginal discharge. Found to have large friable mass, biopsy showed adenocarcinoma of cervix, no LVSI.   09/07/2013 -  Cancer Staged Saw Dr. Alycia Rossetti in [place]. Exam showed 8cm mass fillign vagina, no parametrial or sidewall involvement - clinical IB2. PET showed positive pelvic LN.   10/11/2013 - 11/23/2013 Radiation EBRT, total of 45 cGy to whole pelvis and additional 9 cGy to PET positive nodes. Suboptimal response, not candidate for HDR.   10/11/2013 - 11/08/2013 Chemotherapy Sensitizing cisplatin 40 mg/m2 x5 cycles. Cycle 6 held due to heavy vaginal bleeding on 11/09/13, requiring admission to hospital and vaginal packing and transfusion of 3U pRBCs. Exam 11/15/13 showed persistent 5cm bulky, friable mass.   12/24/2013 Surgery Abdominal modified radical hysterectomy, bilateral salping-oophorectomy, bilateral pelvic lymphadenectomy.   - 02/22/2014 Radiation boost vaginal brachytherapy   01/24/2014 - 03/14/2014 Chemotherapy 3 cycles paclitaxel and carboplatin  Family History:   Family History    Family History  Problem Relation Age of Onset  . Heart disease Father   . COPD Father   . Diabetes Father   . Hypertension Father   . Arthritis Father   . Arthritis Mother   . Cancer Maternal Aunt     Breast  . Cancer Maternal Grandmother     Skin  . Cancer Paternal Grandmother     Breast   . Cancer Maternal Aunt     Lung  . Diabetes Maternal Grandmother   . Heart disease Maternal Grandmother      Social History:   Social History    Social History   Social History  . Marital Status: Divorced    Spouse Name: N/A  . Number of Children: N/A  . Years of Education: N/A   Social History Main Topics  . Smoking status: Former Smoker -- 0.50 packs/day for 2 years    Types: Cigarettes    Quit date: 08/30/2013  . Smokeless tobacco: Never Used  . Alcohol Use: 0.0 oz/week    0 Standard drinks or equivalent, 0 Glasses of wine, 0 Cans of beer, 0 Shots of liquor per week     Comment: Rare  . Drug Use: No  . Sexual Activity: Yes   Other Topics Concern  . None   Social History Narrative     Allergy: No Known Allergies  CURRENT MEDICATIONS:  Encounter Medications    Outpatient Encounter Prescriptions as of 05/22/2015  Medication Sig Dispense Refill  . acyclovir (ZOVIRAX) 400 MG tablet Take 1 tablet (400 mg total) by mouth daily at 10am. 90 tablet 4  . cyclobenzaprine (FLEXERIL) 10 MG tablet Take 10 mg by mouth nightly as needed. Hasn't started yet    . diclofenac sodium (VOLTAREN) 1 % gel Apply 2 g topically.    . gabapentin (NEURONTIN) 300 MG capsule Take 300 mg by mouth Three (3) times a day.    . sertraline (ZOLOFT) 100 MG tablet Take 100 mg by mouth.    . estrogens, conjugated, (PREMARIN) 0.625 MG tablet Take 1 tablet (0.625 mg total) by mouth daily. Take daily for 21 days then do not take for 7 days. 30 tablet 11  . [DISCONTINUED] conjugated estrogens (PREMARIN) 0.625 mg/gram vaginal cream Insert 0.5 g into the vagina Two (2) times a week. 4 g 11  . [DISCONTINUED] LYRICA 50 mg capsule Take 300 mg by mouth Three (3) times a day.  0   No facility-administered encounter medications on file as of 05/22/2015.      PHYSICAL EXAM: Well-nourished  well-developed female in no acute distress.  Neck: Supple, no lymphadenopathy, no thyromegaly.  Lungs: Clear to auscultation.  Cardiac S1: Regular rate and rhythm.  Abdomen: Soft, nontender, nondistended. There are no palpable masses or hepatomegaly.Well-healed surgical incisions. No evidence of any incisional hernias. There is no fluid wave.  Groins: No lymphadenopathy.  Extremities: No edema. Non-tender left leg.  Pelvic: External genitalia within normal limits. Vagina slightly atrophic. The top of the vagina was notable for radiation changes. Pap smear submitted without difficulty. There was some bleeding after the Pap. Bimanual examination reveals firmness but no nodularity. Rectal confirms.  No nodularity or masses on rectal exam. There is smooth thickening consistent with radiation changes but there is no nodularity.  LABORATORY AND RADIOLOGIC STUDIES: Pap smear

## 2016-08-28 NOTE — Patient Instructions (Signed)
We will call you and release the results in Mychart of your pap smear.  Plan to have a CT scan of the abdomen and pelvis on September 10 at 11 am with arrival at 8:45 to check in and start drinking contrast at 9am.  Nothing to eat or drink 4 hours before.  You will meet with Dr. Alycia Rossetti on Sept 12.  Please call for any questions or concerns.

## 2016-08-28 NOTE — Addendum Note (Signed)
Addended by: [name] D on: 08/28/2016 02:50 PM   Modules accepted: Orders

## 2016-09-04 LAB — CYTOLOGY - PAP
DIAGNOSIS: UNDETERMINED — AB
HPV: DETECTED — AB

## 2016-09-10 ENCOUNTER — Telehealth: Payer: Self-pay | Admitting: Gynecologic Oncology

## 2016-09-10 NOTE — Telephone Encounter (Signed)
Left message for patient about pap and Dr. Elenora Gamma recommendation for repeat pap in three months.

## 2016-10-08 ENCOUNTER — Emergency Department (HOSPITAL_BASED_OUTPATIENT_CLINIC_OR_DEPARTMENT_OTHER)
Admission: EM | Admit: 2016-10-08 | Discharge: 2016-10-08 | Disposition: A | Discharge status: Home / Self Care | Payer: Medicaid Other | Attending: Emergency Medicine | Admitting: Emergency Medicine

## 2016-10-08 ENCOUNTER — Emergency Department (HOSPITAL_COMMUNITY)
Admission: EM | Admit: 2016-10-08 | Discharge: 2016-10-08 | Disposition: A | Discharge status: Home / Self Care | Payer: Medicaid Other | Source: Home / Self Care

## 2016-10-08 ENCOUNTER — Emergency Department (HOSPITAL_BASED_OUTPATIENT_CLINIC_OR_DEPARTMENT_OTHER): Payer: Medicaid Other

## 2016-10-08 ENCOUNTER — Encounter (HOSPITAL_BASED_OUTPATIENT_CLINIC_OR_DEPARTMENT_OTHER): Payer: Self-pay | Admitting: *Deleted

## 2016-10-08 DIAGNOSIS — M545 Low back pain: Secondary | ICD-10-CM | POA: Insufficient documentation

## 2016-10-08 DIAGNOSIS — Y939 Activity, unspecified: Secondary | ICD-10-CM

## 2016-10-08 DIAGNOSIS — Z8541 Personal history of malignant neoplasm of cervix uteri: Secondary | ICD-10-CM | POA: Insufficient documentation

## 2016-10-08 DIAGNOSIS — Y92410 Unspecified street and highway as the place of occurrence of the external cause: Secondary | ICD-10-CM

## 2016-10-08 DIAGNOSIS — Y999 Unspecified external cause status: Secondary | ICD-10-CM | POA: Insufficient documentation

## 2016-10-08 DIAGNOSIS — Z5321 Procedure and treatment not carried out due to patient leaving prior to being seen by health care provider: Secondary | ICD-10-CM | POA: Insufficient documentation

## 2016-10-08 DIAGNOSIS — Z79899 Other long term (current) drug therapy: Secondary | ICD-10-CM | POA: Insufficient documentation

## 2016-10-08 DIAGNOSIS — S29001A Unspecified injury of muscle and tendon of front wall of thorax, initial encounter: Secondary | ICD-10-CM | POA: Diagnosis present

## 2016-10-08 DIAGNOSIS — S29011A Strain of muscle and tendon of front wall of thorax, initial encounter: Secondary | ICD-10-CM | POA: Insufficient documentation

## 2016-10-08 DIAGNOSIS — Z87891 Personal history of nicotine dependence: Secondary | ICD-10-CM | POA: Diagnosis not present

## 2016-10-08 NOTE — ED Triage Notes (Signed)
Pt's husband states pt left to go "to a different hospital."
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
## 2016-10-08 NOTE — ED Notes (Signed)
ED Provider at bedside. 
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
## 2016-10-08 NOTE — ED Notes (Signed)
Patient transported to X-ray 
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
## 2016-12-18 ENCOUNTER — Telehealth: Payer: Self-pay | Admitting: *Deleted

## 2016-12-18 NOTE — Telephone Encounter (Signed)
Returned patients call and moved her appt from September 12th to September 19th. Patient aware of new date/time.

## 2016-12-23 ENCOUNTER — Ambulatory Visit (HOSPITAL_COMMUNITY): Admission: RE | Admit: 2016-12-23 | Payer: Medicaid Other | Source: Ambulatory Visit

## 2016-12-25 ENCOUNTER — Ambulatory Visit: Payer: Medicaid Other | Admitting: Gynecologic Oncology

## 2016-12-31 ENCOUNTER — Encounter (HOSPITAL_COMMUNITY): Payer: Self-pay

## 2016-12-31 ENCOUNTER — Ambulatory Visit (HOSPITAL_COMMUNITY)
Admission: RE | Admit: 2016-12-31 | Discharge: 2016-12-31 | Disposition: A | Discharge status: Home / Self Care | Payer: Medicaid Other | Source: Ambulatory Visit | Attending: Gynecologic Oncology | Admitting: Gynecologic Oncology

## 2016-12-31 DIAGNOSIS — C531 Malignant neoplasm of exocervix: Secondary | ICD-10-CM | POA: Insufficient documentation

## 2016-12-31 MED ORDER — IOPAMIDOL (ISOVUE-300) INJECTION 61%
30.0000 mL | Freq: Once | INTRAVENOUS | Status: AC | PRN
  Administered 2016-12-31: 30 mL via ORAL

## 2016-12-31 MED ORDER — IOPAMIDOL (ISOVUE-300) INJECTION 61%
100.0000 mL | Freq: Once | INTRAVENOUS | Status: AC | PRN
  Administered 2016-12-31: 100 mL via INTRAVENOUS

## 2016-12-31 MED ORDER — IOPAMIDOL (ISOVUE-300) INJECTION 61%
INTRAVENOUS | Status: AC
  Administered 2016-12-31: 30 mL via ORAL
  Filled 2016-12-31: qty 30

## 2016-12-31 MED ORDER — IOPAMIDOL (ISOVUE-300) INJECTION 61%
INTRAVENOUS | Status: AC
  Filled 2016-12-31: qty 100

## 2017-01-01 ENCOUNTER — Ambulatory Visit: Payer: Medicaid Other | Attending: Gynecologic Oncology | Admitting: Gynecologic Oncology

## 2017-01-01 ENCOUNTER — Other Ambulatory Visit: Payer: Self-pay | Admitting: Gynecologic Oncology

## 2017-01-01 ENCOUNTER — Other Ambulatory Visit (HOSPITAL_COMMUNITY)
Admission: RE | Admit: 2017-01-01 | Discharge: 2017-01-01 | Disposition: A | Discharge status: Home / Self Care | Payer: Medicaid Other | Source: Ambulatory Visit | Attending: Gynecologic Oncology | Admitting: Gynecologic Oncology

## 2017-01-01 ENCOUNTER — Encounter: Payer: Self-pay | Admitting: Gynecologic Oncology

## 2017-01-01 VITALS — BP 121/76 | HR 85 | Temp 98.3°F | Resp 18 | Ht 65.0 in | Wt 168.4 lb

## 2017-01-01 DIAGNOSIS — Z923 Personal history of irradiation: Secondary | ICD-10-CM | POA: Diagnosis not present

## 2017-01-01 DIAGNOSIS — C531 Malignant neoplasm of exocervix: Secondary | ICD-10-CM

## 2017-01-01 DIAGNOSIS — Z87891 Personal history of nicotine dependence: Secondary | ICD-10-CM | POA: Insufficient documentation

## 2017-01-01 DIAGNOSIS — Z8261 Family history of arthritis: Secondary | ICD-10-CM | POA: Insufficient documentation

## 2017-01-01 DIAGNOSIS — Z90722 Acquired absence of ovaries, bilateral: Secondary | ICD-10-CM | POA: Insufficient documentation

## 2017-01-01 DIAGNOSIS — Z833 Family history of diabetes mellitus: Secondary | ICD-10-CM | POA: Diagnosis not present

## 2017-01-01 DIAGNOSIS — Z8541 Personal history of malignant neoplasm of cervix uteri: Secondary | ICD-10-CM | POA: Insufficient documentation

## 2017-01-01 DIAGNOSIS — Z79899 Other long term (current) drug therapy: Secondary | ICD-10-CM | POA: Insufficient documentation

## 2017-01-01 DIAGNOSIS — F329 Major depressive disorder, single episode, unspecified: Secondary | ICD-10-CM | POA: Diagnosis not present

## 2017-01-01 DIAGNOSIS — Z8249 Family history of ischemic heart disease and other diseases of the circulatory system: Secondary | ICD-10-CM | POA: Diagnosis not present

## 2017-01-01 DIAGNOSIS — Z9221 Personal history of antineoplastic chemotherapy: Secondary | ICD-10-CM | POA: Diagnosis not present

## 2017-01-01 DIAGNOSIS — F419 Anxiety disorder, unspecified: Secondary | ICD-10-CM | POA: Insufficient documentation

## 2017-01-01 DIAGNOSIS — Z825 Family history of asthma and other chronic lower respiratory diseases: Secondary | ICD-10-CM | POA: Insufficient documentation

## 2017-01-01 DIAGNOSIS — Z90710 Acquired absence of both cervix and uterus: Secondary | ICD-10-CM | POA: Diagnosis not present

## 2017-01-01 DIAGNOSIS — Z08 Encounter for follow-up examination after completed treatment for malignant neoplasm: Secondary | ICD-10-CM | POA: Diagnosis not present
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
## 2017-01-01 NOTE — Patient Instructions (Addendum)
We will see you back in 6 months. We will notify you of the results of your Pap smear from today. We will plan on repeating a CT scan in one year. Happy 3 year anniversary!  Please call our office in Nov or Dec to schedule your appointment.

## 2017-01-03 LAB — CYTOLOGY - PAP

## 2017-02-20 ENCOUNTER — Telehealth: Payer: Self-pay | Admitting: *Deleted

## 2017-02-20 NOTE — Telephone Encounter (Signed)
Received request for Medical records from [place], forwarded to [place] for email/scan/SLS 11/08

## 2017-05-13 ENCOUNTER — Telehealth: Payer: Self-pay | Admitting: *Deleted

## 2017-05-13 NOTE — Telephone Encounter (Signed)
Patient called to schedule a follow up appt with Dr. Alycia Rossetti. Explained to the patient that Dr. Alycia Rossetti will no longer be at this office but will still see patients at the [facility] office. Patient offered Dr. Denman George or Dr. Gerarda Fraction. Patient given the number for [facility] to follow Dr. Alycia Rossetti

## 2018-05-08 ENCOUNTER — Encounter: Payer: Self-pay | Admitting: Gynecologic Oncology

## 2021-02-22 ENCOUNTER — Telehealth: Payer: Self-pay | Admitting: *Deleted

## 2021-02-22 NOTE — Telephone Encounter (Signed)
Returned the patient's call and explained that she can come see Dr Lahoma Crocker in January. Explained that the schedule for January is not out yet, but the office will call her once it is out.

## 2021-04-23 ENCOUNTER — Telehealth: Payer: Self-pay

## 2021-04-23 NOTE — Telephone Encounter (Signed)
Returning call to [place]. Follow up appointment scheduled with Dr. Delsa Sale on 05/24/21 at 11am. Patent is agreeable to appointment date and time. Instructed to call with any needs.

## 2021-05-22 ENCOUNTER — Encounter: Payer: Self-pay | Admitting: Obstetrics & Gynecology

## 2021-05-23 NOTE — Assessment & Plan Note (Addendum)
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 She had a large persistent tumor with chemorads and underwent a hysterectomy and bilateral LND. She was dispositioned to vaginal brachytherapy and Carbo/Taxol.  She is s/p 3 additional cycles of carboplatin which she completed in 11/15 as well as completing her vaginal brachytherapy.   Negative symptom review, normal exam.  No evidence of recurrence  >continue annual f/u w/Pap smear testing

## 2021-05-23 NOTE — Progress Notes (Signed)
Follow Up Note: Gyn-Onc  Karen Daniels 46 y.o. female  CC: She returns for a f/u visit   HPI: The oncology history was reviewed.  Interval History: She denies any vaginal bleeding, abdominal/pelvic pain, leg pain, urinary symptoms, cough or weight loss. A Pap in 1/22 showed NILM.    Review of Systems  Review of Systems  Constitutional:  Negative for malaise/fatigue and weight loss.  Respiratory:  Negative for shortness of breath and wheezing.   Cardiovascular:  Negative for chest pain and leg swelling.  Gastrointestinal:  Negative for abdominal pain, blood in stool, constipation, nausea and vomiting.  Genitourinary:  Negative for dysuria, frequency, hematuria and urgency.  Musculoskeletal:  Negative for joint pain and myalgias.  Neurological:  Negative for weakness.  Psychiatric/Behavioral:  Negative for depression. The patient does not have insomnia.    Current medications, allergy, social history, past surgical history, past medical history, family history were all reviewed.   Vitals:  BP 122/77 (BP Location: Right Arm, Patient Position: Sitting)    Pulse 72    Temp 98.2 F (36.8 C) (Oral)    Resp 16    Ht 5' 4.17" (1.63 m)    Wt 162 lb 8 oz (73.7 kg)    LMP 11/16/2013    SpO2 98%    BMI 27.74 kg/m   Physical Exam:  Physical Exam Exam conducted with a chaperone present.  Constitutional:      General: She is not in acute distress. Cardiovascular:     Rate and Rhythm: Normal rate and regular rhythm.  Pulmonary:     Effort: Pulmonary effort is normal.     Breath sounds: Normal breath sounds. No wheezing or rhonchi.  Abdominal:     Palpations: Abdomen is soft.     Tenderness: There is no abdominal tenderness. There is no right CVA tenderness or left CVA tenderness.     Hernia: No hernia is present.  Genitourinary:    General: Normal vulva.     Urethra: No urethral lesion.     Vagina: Foreshortened.  No lesions. No bleeding Musculoskeletal:     Cervical back: Neck  supple.     Right lower leg: No edema.     Left lower leg: No edema.  Lymphadenopathy:     Upper Body:     Right upper body: No supraclavicular adenopathy.     Left upper body: No supraclavicular adenopathy.     Lower Body: No right inguinal adenopathy. No left inguinal adenopathy.  Skin:    Findings: No rash.  Neurological:     Mental Status: She is oriented to person, place, and time.   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 She had a large persistent tumor with chemorads and underwent a hysterectomy and bilateral LND. She was dispositioned to vaginal brachytherapy and  Carbo/Taxol.   She is s/p 3 additional cycles of carboplatin which she completed in 11/15 as well as completing her vaginal brachytherapy.   Negative symptom review, normal exam.  No evidence of recurrence  >continue annual f/u w/Pap smear testing    Lahoma Crocker, MD

## 2021-05-24 ENCOUNTER — Encounter: Payer: Self-pay | Admitting: Obstetrics & Gynecology

## 2021-05-24 ENCOUNTER — Other Ambulatory Visit: Payer: Self-pay

## 2021-05-24 ENCOUNTER — Other Ambulatory Visit (HOSPITAL_COMMUNITY)
Admission: RE | Admit: 2021-05-24 | Discharge: 2021-05-24 | Disposition: A | Discharge status: Home / Self Care | Payer: Medicaid Other | Source: Ambulatory Visit | Attending: Obstetrics & Gynecology | Admitting: Obstetrics & Gynecology

## 2021-05-24 ENCOUNTER — Inpatient Hospital Stay: Payer: Medicaid Other | Attending: Obstetrics & Gynecology | Admitting: Obstetrics & Gynecology

## 2021-05-24 VITALS — BP 122/77 | HR 72 | Temp 98.2°F | Resp 16 | Ht 64.17 in | Wt 162.5 lb

## 2021-05-24 DIAGNOSIS — C539 Malignant neoplasm of cervix uteri, unspecified: Secondary | ICD-10-CM

## 2021-05-24 DIAGNOSIS — Z90710 Acquired absence of both cervix and uterus: Secondary | ICD-10-CM | POA: Diagnosis not present

## 2021-05-24 DIAGNOSIS — Z8541 Personal history of malignant neoplasm of cervix uteri: Secondary | ICD-10-CM | POA: Insufficient documentation

## 2021-05-24 DIAGNOSIS — Z9221 Personal history of antineoplastic chemotherapy: Secondary | ICD-10-CM | POA: Insufficient documentation

## 2021-05-24 DIAGNOSIS — Z923 Personal history of irradiation: Secondary | ICD-10-CM | POA: Diagnosis not present

## 2021-05-24 DIAGNOSIS — Z90722 Acquired absence of ovaries, bilateral: Secondary | ICD-10-CM | POA: Diagnosis not present

## 2021-05-24 NOTE — Patient Instructions (Signed)
Return in 1 yr 

## 2021-05-30 ENCOUNTER — Telehealth: Payer: Self-pay

## 2021-05-30 LAB — CYTOLOGY - PAP
Comment: NEGATIVE
Diagnosis: UNDETERMINED — AB
High risk HPV: POSITIVE — AB

## 2021-05-30 NOTE — Telephone Encounter (Signed)
Spoke with [name] this afternoon and reviewed pap smear results. Per [name], NP results show atypical cells and HPV positive. We need to take a closer look and perform a colposcopy. Patient verbalized understanding, no questions voiced.  Appointment scheduled for 06/21/21 at 11:30 am with Dr. Delsa Sale. Patient is in agreement of appointment. Instructed to call with any needs.

## 2021-05-30 NOTE — Telephone Encounter (Signed)
Attempted to contact patient to review pap smear results. Unable to reach patient. Left message requesting return call.

## 2021-06-20 ENCOUNTER — Encounter: Payer: Self-pay | Admitting: Obstetrics & Gynecology

## 2021-06-21 ENCOUNTER — Encounter: Payer: Self-pay | Admitting: Obstetrics & Gynecology

## 2021-06-21 ENCOUNTER — Other Ambulatory Visit: Payer: Self-pay

## 2021-06-21 ENCOUNTER — Inpatient Hospital Stay: Payer: Medicaid Other | Attending: Obstetrics & Gynecology | Admitting: Obstetrics & Gynecology

## 2021-06-21 VITALS — BP 124/79 | HR 82 | Temp 98.3°F | Resp 14 | Ht 64.17 in | Wt 162.2 lb

## 2021-06-21 DIAGNOSIS — C539 Malignant neoplasm of cervix uteri, unspecified: Secondary | ICD-10-CM

## 2021-06-21 NOTE — Progress Notes (Signed)
BP 124/79 (BP Location: Right Arm, Patient Position: Sitting)   Pulse 82   Temp 98.3 ?F (36.8 ?C) (Oral)   Resp 14   Ht 5' 4.17" (1.63 m)   Wt 162 lb 3.2 oz (73.6 kg)   LMP 11/16/2013   SpO2 98%   BMI 27.69 kg/m?   ? ?Colposcopy Procedure Note ? ?Indications: Pap smear 1 months ago showed: ASCUS with POSITIVE high risk HPV. The prior pap showed ASCUS with POSITIVE high risk HPV.  . ? ?Procedure Details  ?The risks and benefits of the procedure and Verbal informed consent obtained.  A time-out was performed confirming the patient, procedure and allergy status ? ?Speculum placed in vagina and excellent visualization of cervix achieved, cervix swabbed x 3 with acetic acid solution. ? ?Findings: ?  ?Vaginal inspection:   No discrete lesions; Areas with circuitous vessels ? ?  ?  ?Specimens: None ? ?Complications: none. ? ?Plan: ?Return prn or in 6 mos ?

## 2021-06-21 NOTE — Patient Instructions (Signed)
Return in 6 mos

## 2021-07-02 ENCOUNTER — Telehealth: Payer: Self-pay | Admitting: *Deleted

## 2021-07-02 NOTE — Telephone Encounter (Signed)
Fax signed order back to [facility]  ?

## 2022-07-16 ENCOUNTER — Encounter: Payer: Self-pay | Admitting: Gynecologic Oncology

## 2022-11-06 ENCOUNTER — Telehealth: Payer: Self-pay

## 2022-11-06 NOTE — Telephone Encounter (Signed)
Patient called in to schedule her yearly visit.  Scheduled for 8/13 @ 1:45pm with Dr Tamela Oddi.  She confirmed and understood.

## 2022-11-20 ENCOUNTER — Encounter: Payer: Self-pay | Admitting: Obstetrics & Gynecology

## 2022-11-26 ENCOUNTER — Encounter: Payer: Self-pay | Admitting: Obstetrics & Gynecology

## 2022-11-26 ENCOUNTER — Other Ambulatory Visit (HOSPITAL_COMMUNITY)
Admission: RE | Admit: 2022-11-26 | Discharge: 2022-11-26 | Disposition: A | Discharge status: Home / Self Care | Payer: Medicaid Other | Source: Ambulatory Visit | Attending: Obstetrics & Gynecology | Admitting: Obstetrics & Gynecology

## 2022-11-26 ENCOUNTER — Inpatient Hospital Stay: Payer: Medicaid Other | Attending: Obstetrics & Gynecology | Admitting: Obstetrics & Gynecology

## 2022-11-26 ENCOUNTER — Other Ambulatory Visit: Payer: Self-pay

## 2022-11-26 VITALS — BP 121/90 | HR 69 | Temp 98.6°F | Resp 16 | Ht 64.0 in | Wt 156.2 lb

## 2022-11-26 DIAGNOSIS — Z08 Encounter for follow-up examination after completed treatment for malignant neoplasm: Secondary | ICD-10-CM | POA: Diagnosis present

## 2022-11-26 DIAGNOSIS — C539 Malignant neoplasm of cervix uteri, unspecified: Secondary | ICD-10-CM | POA: Diagnosis present

## 2022-11-26 DIAGNOSIS — Z90710 Acquired absence of both cervix and uterus: Secondary | ICD-10-CM | POA: Insufficient documentation

## 2022-11-26 DIAGNOSIS — Z8541 Personal history of malignant neoplasm of cervix uteri: Secondary | ICD-10-CM | POA: Insufficient documentation

## 2022-11-26 DIAGNOSIS — Z923 Personal history of irradiation: Secondary | ICD-10-CM | POA: Diagnosis not present

## 2022-11-26 DIAGNOSIS — Z9221 Personal history of antineoplastic chemotherapy: Secondary | ICD-10-CM | POA: Diagnosis not present

## 2022-11-26 NOTE — Assessment & Plan Note (Addendum)
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 She had a large persistent tumor with chemorads and underwent a hysterectomy and bilateral LND. She was dispositioned to vaginal brachytherapy and  Carbo/Taxol.   She is s/p 3 additional cycles of carboplatin which she completed in 11/15 as well as completing her vaginal brachytherapy.   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

## 2022-11-26 NOTE — Patient Instructions (Signed)
Our office will call with the pap smear results. Please call at the beginning of the year to schedule for February.

## 2022-11-26 NOTE — Progress Notes (Signed)
Follow Up Note: Gyn-Onc  Karen Daniels 47 y.o. female  CC: She returns for a f/u visit   HPI: The oncology history was reviewed.  Interval History: She denies any vaginal bleeding, leg pain, urinary symptoms, cough or weight loss. Has low back, mild low abdominal discomfort.  A Pap in 2/23 showed ASCUS/HR-HPV pos. Findings on a follow-up colposcopy were benign.   Review of Systems  Review of Systems  Constitutional:  Negative for malaise/fatigue and weight loss.  Respiratory:  Negative for shortness of breath and wheezing.   Cardiovascular:  Negative for chest pain and leg swelling.  Gastrointestinal:  Negative for abdominal pain, blood in stool, constipation, nausea and vomiting.  Genitourinary:  Negative for dysuria, frequency, hematuria and urgency.  Musculoskeletal:  Negative for joint pain and myalgias.  Neurological:  Negative for weakness.  Psychiatric/Behavioral:  Negative for depression. The patient does not have insomnia.    Current medications, allergy, social history, past surgical history, past medical history, family history were all reviewed.   Vitals: BP (!) 121/90 (BP Location: Left Arm, Patient Position: Sitting)   Pulse 69   Temp 98.6 F (37 C) (Oral)   Resp 16   Ht 5\' 4"  (1.626 m)   Wt 156 lb 3.2 oz (70.9 kg)   LMP 11/16/2013   SpO2 99%   BMI 26.81 kg/m    Physical Exam Exam conducted with a chaperone present.  Constitutional:      General: She is not in acute distress. Cardiovascular:     Rate and Rhythm: Normal rate and regular rhythm.  Pulmonary:     Effort: Pulmonary effort is normal.     Breath sounds: Normal breath sounds. No wheezing or rhonchi.  Abdominal:     Palpations: Abdomen is soft.     Tenderness: There is no abdominal tenderness. There is no right CVA tenderness or left CVA tenderness.     Hernia: No hernia is present.  Genitourinary:    General: Normal vulva.     Urethra: No urethral lesion.     Vagina: Foreshortened.  Focal  erythematous plaque/central,friable. No palpable abnormality Musculoskeletal:     Cervical back: Neck supple.     Right lower leg: No edema.     Left lower leg: No edema.  Lymphadenopathy:     Upper Body:     Right upper body: No supraclavicular adenopathy.     Left upper body: No supraclavicular adenopathy.     Lower Body: No right inguinal adenopathy. No left inguinal adenopathy.  Skin:    Findings: No rash.  Neurological:     Mental Status: She is oriented to person, place, and time.   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 She had a large persistent tumor with chemorads and underwent a hysterectomy and bilateral LND. She was dispositioned to vaginal brachytherapy and  Carbo/Taxol.   She is s/p 3 additional cycles of carboplatin which she completed in 11/15 as well as completing her vaginal brachytherapy.   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    Antionette Char, MD

## 2022-11-27 ENCOUNTER — Telehealth: Payer: Self-pay | Admitting: Obstetrics & Gynecology

## 2022-11-27 DIAGNOSIS — N952 Postmenopausal atrophic vaginitis: Secondary | ICD-10-CM

## 2022-11-27 MED ORDER — ESTRADIOL 0.1 MG/GM VA CREA: 0.5000 g | TOPICAL_CREAM | VAGINAL | 3 refills | Status: DC

## 2022-11-27 NOTE — Telephone Encounter (Signed)
Please call the pt.  I'm sending a Rx to the pharmacy for vaginal estrogen.  I saw a friable area that I think is likely 2/2 thinning of the vagina.  She will also need an appointment to biopsy the area.

## 2022-11-28 NOTE — Telephone Encounter (Signed)
PT aware of Estrace cream being sent to pharmacy. Also follow up scheduled for 9/25 wirh Dr. Tamela Oddi for a biopsy

## 2022-11-29 ENCOUNTER — Telehealth: Payer: Self-pay

## 2022-11-29 DIAGNOSIS — N952 Postmenopausal atrophic vaginitis: Secondary | ICD-10-CM

## 2022-11-29 MED ORDER — ESTROGENS CONJUGATED 0.625 MG/GM VA CREA: 0.5000 g | TOPICAL_CREAM | VAGINAL | 3 refills | Status: DC

## 2022-11-29 NOTE — Telephone Encounter (Signed)
Office received a PA authorization request from [employer] for Estradiol 0.1mg /gm.   PA started

## 2022-11-29 NOTE — Addendum Note (Signed)
Addended by: Warner Mccreedy D on: 11/29/2022 11:42 AM   Modules accepted: Orders

## 2022-11-29 NOTE — Telephone Encounter (Signed)
PA denied by insurance for Estradiol vag cream. Other alternatives for pt to try are as follows  :Estring vaginal ring, Premarin vaginal cream, Vagifem vaginal tablets.  Please advise

## 2022-11-29 NOTE — Telephone Encounter (Signed)
Pt aware of new prescription of Premarin being sent to pharmacy.

## 2022-12-02 LAB — CYTOLOGY - PAP: Comment: NEGATIVE

## 2022-12-03 ENCOUNTER — Telehealth: Payer: Self-pay | Admitting: *Deleted

## 2022-12-03 NOTE — Telephone Encounter (Signed)
Spoke with Ms. Ridl and relayed message from Warner Mccreedy, NP that her pap smear showed atypical cells ( felt to be related to prior radiation treatment). HPV high risk was negative. Pt reminded of her follow up appt. With Dr. Tamela Oddi on September 25 th at 1100. Pt verbalized understanding and thanked the office for calling.

## 2022-12-03 NOTE — Telephone Encounter (Signed)
-----   Message from Doylene Bode sent at 12/03/2022  1:03 PM EDT ----- Please let her know her pap smear showed atypical cells (felt to be related to prior radiation treatment). HPV high risk was negative. In this case, the atypical cells are felt to be related to prior radiation. Follow up as recommended at her last visit.

## 2023-01-08 ENCOUNTER — Ambulatory Visit: Payer: Medicaid Other | Admitting: Obstetrics & Gynecology

## 2023-01-08 ENCOUNTER — Inpatient Hospital Stay: Payer: Medicaid Other | Attending: Obstetrics & Gynecology | Admitting: Obstetrics & Gynecology

## 2023-01-08 VITALS — BP 120/79 | HR 71 | Temp 98.8°F | Resp 20 | Wt 149.8 lb

## 2023-01-08 DIAGNOSIS — Z8541 Personal history of malignant neoplasm of cervix uteri: Secondary | ICD-10-CM | POA: Insufficient documentation

## 2023-01-08 DIAGNOSIS — C539 Malignant neoplasm of cervix uteri, unspecified: Secondary | ICD-10-CM

## 2023-01-08 DIAGNOSIS — N952 Postmenopausal atrophic vaginitis: Secondary | ICD-10-CM | POA: Diagnosis present

## 2023-01-08 MED ORDER — ESTROGENS CONJUGATED 0.625 MG/GM VA CREA: 0.5000 g | TOPICAL_CREAM | VAGINAL | 3 refills | Status: DC

## 2023-01-08 NOTE — Patient Instructions (Signed)
We will call with the biopsy results. Return in 6 months. Call at the end of December or beginning of January to schedule for March

## 2023-01-08 NOTE — Progress Notes (Unsigned)
Marland Kitchen  Patient ID: Karen Daniels, female   DOB: 1975/08/09, 47 y.o.   MRN: [id number]  Chief Complaint  Patient presents with   Atrophic vaginitis    HPI Karen Daniels is a 47 y.o. 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  She presents for bs of a vaginal apex lesion.Marland Kitchen  She was unable to fill the Rx for the vaginal E2. Pap in 8/24  showed ASCUS/HR-HPV neg.     PE: erythematous patch a vaginal apex--smaller on today's exam--ill-defined borders  Current medications, allergy, social history, past surgical history, past medical history, family history were all reviewed    Blood pressure 120/79, pulse 71, temperature 98.8 F (37.1 C), resp. rate 20, weight 149 lb 12.8 oz (67.9 kg), last menstrual period 11/16/2013, SpO2 100%.  Physical Exam Pelvic: erythematous patch a vaginal apex--smaller on today's exam--ill-defined borders    Procedure    Prepping with Betadine A biopsy was taken with the Tischler forceps.  Monsel's solution was applied Well tolerated Specimen appropriately identified and sent to pathology    Plan  Review the histology result  Follow-up: prn or keep the previously scheduled appointment      Antionette Char 01/09/2023, 7:10 PM

## 2023-01-09 ENCOUNTER — Encounter: Payer: Self-pay | Admitting: Obstetrics & Gynecology

## 2023-01-13 LAB — SURGICAL PATHOLOGY

## 2023-01-16 ENCOUNTER — Telehealth: Payer: Self-pay | Admitting: *Deleted

## 2023-01-16 NOTE — Telephone Encounter (Signed)
-----   Message from Doylene Bode sent at 01/16/2023  1:14 PM EDT ----- Please call the patient with her vaginal biopsy results. No precancer or cancer seen. Biopsy showing mild inflammation and reactive changes related to radiation.  A. VAGINA, BIOPSY:  - Benign squamous mucosa with mild chronic inflammation and reactive  changes  - Negative for dysplasia or malignancy ----- Message ----- From: Interface, [facility] Sent: 01/10/2023   6:10 PM EDT To: Antionette Char, MD

## 2023-01-16 NOTE — Telephone Encounter (Signed)
Attempted to reach patient in regards to biopsy results. Left voicemail requesting call back.

## 2023-01-17 NOTE — Telephone Encounter (Signed)
Spoke with Ms. Fakhouri and relayed message from Warner Mccreedy, NP that vaginal biopsy results show no precancer or cancer. Biopsy shows mild inflammation and reactive changes related to radiation. Pt verbalized understanding and also viewed her results on MyChart. Pt thanked the office for calling and had no further concerns or questions at this time.

## 2023-07-25 ENCOUNTER — Encounter: Payer: Self-pay | Admitting: Gynecologic Oncology

## 2024-05-12 ENCOUNTER — Inpatient Hospital Stay: Attending: Obstetrics & Gynecology | Admitting: Obstetrics & Gynecology

## 2024-05-12 ENCOUNTER — Encounter: Payer: Self-pay | Admitting: Obstetrics & Gynecology

## 2024-05-12 VITALS — BP 120/87 | HR 73 | Temp 98.6°F | Resp 19 | Wt 162.4 lb

## 2024-05-12 DIAGNOSIS — Z08 Encounter for follow-up examination after completed treatment for malignant neoplasm: Secondary | ICD-10-CM | POA: Insufficient documentation

## 2024-05-12 DIAGNOSIS — Z923 Personal history of irradiation: Secondary | ICD-10-CM | POA: Diagnosis not present

## 2024-05-12 DIAGNOSIS — Z8541 Personal history of malignant neoplasm of cervix uteri: Secondary | ICD-10-CM | POA: Diagnosis present

## 2024-05-12 DIAGNOSIS — Z90710 Acquired absence of both cervix and uterus: Secondary | ICD-10-CM | POA: Insufficient documentation

## 2024-05-12 DIAGNOSIS — C539 Malignant neoplasm of cervix uteri, unspecified: Secondary | ICD-10-CM

## 2024-05-12 DIAGNOSIS — Z9221 Personal history of antineoplastic chemotherapy: Secondary | ICD-10-CM | POA: Insufficient documentation

## 2024-05-12 NOTE — Assessment & Plan Note (Addendum)
 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 She had a large persistent tumor with chemorads and underwent a hysterectomy and bilateral LND. She was dispositioned to vaginal brachytherapy and  Carbo/Taxol.   She is s/p 3 additional cycles of carboplatin which she completed in 11/15 as well as completing her vaginal brachytherapy.     >continue annual f/u w/Pap smear testing

## 2024-05-12 NOTE — Progress Notes (Signed)
 Follow Up Note: Gyn-Onc  Karen Daniels 49 y.o. female  CC: She returns for a f/u visit   HPI: The oncology history was reviewed.  Interval History: She denies any vaginal bleeding, leg pain, urinary symptoms, cough or weight loss. Colpo in 9/24 benign.  Pap in 8/24 showed ASCUS-HRHPV neg.   Review of Systems  Review of Systems  Constitutional:  Negative for malaise/fatigue and weight loss.  Respiratory:  Negative for shortness of breath and wheezing.   Cardiovascular:  Negative for chest pain and leg swelling.  Gastrointestinal:  Negative for abdominal pain, blood in stool, constipation, nausea and vomiting.  Genitourinary:  Negative for dysuria, frequency, hematuria and urgency.  Musculoskeletal:  Negative for joint pain and myalgias.  Neurological:  Negative for weakness.  Psychiatric/Behavioral:  Negative for depression. The patient does not have insomnia.    Current medications, allergy, social history, past surgical history, past medical history, family history were all reviewed.   Vitals: BP 120/87 (BP Location: Right Arm, Patient Position: Sitting)   Pulse 73   Temp 98.6 F (37 C) (Oral)   Resp 19   Wt 162 lb 6.4 oz (73.7 kg)   LMP 11/16/2013   SpO2 98%   BMI 27.88 kg/m     Physical Exam Exam conducted with a chaperone present.  Constitutional:      General: She is not in acute distress. Cardiovascular:     Rate and Rhythm: Normal rate and regular rhythm.  Pulmonary:     Effort: Pulmonary effort is normal.     Breath sounds: Normal breath sounds. No wheezing or rhonchi.  Abdominal:     Palpations: Abdomen is soft.     Tenderness: There is no abdominal tenderness. There is no right CVA tenderness or left CVA tenderness.     Hernia: No hernia is present.  Genitourinary:    General: Normal vulva.     Urethra: No urethral lesion.     Vagina: Foreshortened.  Sclerotic areas, friable areas. No palpable abnormality Musculoskeletal:     Cervical back: Neck  supple.     Right lower leg: No edema.     Left lower leg: No edema.  Lymphadenopathy:     Upper Body:     Right upper body: No supraclavicular adenopathy.     Left upper body: No supraclavicular adenopathy.     Lower Body: No right inguinal adenopathy. No left inguinal adenopathy.  Skin:    Findings: No rash.  Neurological:     Mental Status: She is oriented to person, place, and time.   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 She had a large persistent tumor with chemorads and underwent a hysterectomy and bilateral LND. She was dispositioned to vaginal brachytherapy and  Carbo/Taxol.   She is s/p 3 additional cycles of carboplatin which she completed in 11/15 as well as completing her vaginal brachytherapy.     >continue annual f/u w/Pap smear testing    I personally spent 25 minutes face-to-face and non-face-to-face in the care of this patient, which includes all pre, intra, and post visit time on the date of service.    [place], MD

## 2024-05-12 NOTE — Patient Instructions (Addendum)
 OB/GYN offices you can choose from are: [facility] or [facility]   VISIT SUMMARY: Today, you had a routine gynecological checkup and pap smear. You reported no current symptoms, and your weight, bowel, and bladder functions are stable. You shared a humorous story about your recent trip to [place] and a fall on the ice while walking your dog.  YOUR PLAN: -WOMAN'S WELLNESS VISIT: This visit was a routine checkup to ensure your overall gynecological health. Your pelvic exam and Pap smear were performed, and everything appears normal. You are advised to find a local gynecologist for your annual exams.  -MALIGNANT NEOPLASM OF CERVIX UTERI, UNSPECIFIED: This condition refers to a previous diagnosis of cervical cancer. Post-radiation changes were noted, and the tissues are easily traumatized from prior radiation. We will continue routine monitoring and follow-up to ensure your health remains stable.  INSTRUCTIONS: Please find a local gynecologist for your annual exams. Continue with routine monitoring and follow-up for your cervical health.    Contains text generated by [employer].

## 2024-05-21 ENCOUNTER — Telehealth: Payer: Self-pay

## 2024-05-21 LAB — CYTOLOGY - PAP
Comment: NEGATIVE
Diagnosis: NEGATIVE

## 2024-05-21 NOTE — Telephone Encounter (Signed)
 Per Eleanor Epps NP, I reached out to [facility] lab and spoke to [facility] in regards to adding HPV to the recent pap smear test collected by Dr.Jackson-Moore on 05/12/24.
